# Patient Record
Sex: Female | Born: 1975 | Race: White | Hispanic: No | Marital: Married | Smoking: Never smoker
Health system: Southern US, Community
[De-identification: ages and names within clinical notes are randomized; demographics above are authoritative.]

## PROBLEM LIST (undated history)

## (undated) DIAGNOSIS — Z98891 History of uterine scar from previous surgery: Secondary | ICD-10-CM

## (undated) DIAGNOSIS — R002 Palpitations: Secondary | ICD-10-CM

## (undated) DIAGNOSIS — R519 Headache, unspecified: Secondary | ICD-10-CM

## (undated) DIAGNOSIS — R87629 Unspecified abnormal cytological findings in specimens from vagina: Secondary | ICD-10-CM

## (undated) DIAGNOSIS — Z789 Other specified health status: Secondary | ICD-10-CM

## (undated) DIAGNOSIS — K219 Gastro-esophageal reflux disease without esophagitis: Secondary | ICD-10-CM

## (undated) DIAGNOSIS — D6861 Antiphospholipid syndrome: Secondary | ICD-10-CM

## (undated) HISTORY — PX: TONSILLECTOMY: SHX5217

## (undated) HISTORY — PX: TONSILLECTOMY: SUR1361

## (undated) HISTORY — DX: Unspecified abnormal cytological findings in specimens from vagina: R87.629

## (undated) HISTORY — DX: Palpitations: R00.2

## (undated) HISTORY — PX: LAPAROSCOPIC GASTRIC BANDING: SHX1100

---

## 1998-06-05 HISTORY — PX: KNEE ARTHROSCOPY: SUR90

## 1998-08-04 ENCOUNTER — Other Ambulatory Visit: Admission: RE | Admit: 1998-08-04 | Discharge: 1998-08-04 | Payer: Self-pay | Admitting: *Deleted

## 1998-10-04 ENCOUNTER — Ambulatory Visit (HOSPITAL_COMMUNITY): Admission: RE | Admit: 1998-10-04 | Discharge: 1998-10-04 | Payer: Self-pay | Admitting: *Deleted

## 1999-10-12 ENCOUNTER — Ambulatory Visit (HOSPITAL_BASED_OUTPATIENT_CLINIC_OR_DEPARTMENT_OTHER): Admission: RE | Admit: 1999-10-12 | Discharge: 1999-10-12 | Payer: Self-pay | Admitting: Orthopedic Surgery

## 2004-02-01 ENCOUNTER — Other Ambulatory Visit: Admission: RE | Admit: 2004-02-01 | Discharge: 2004-02-01 | Payer: Self-pay | Admitting: Obstetrics and Gynecology

## 2005-01-02 ENCOUNTER — Encounter: Admission: RE | Admit: 2005-01-02 | Discharge: 2005-04-02 | Payer: Self-pay | Admitting: General Surgery

## 2005-05-15 ENCOUNTER — Other Ambulatory Visit: Admission: RE | Admit: 2005-05-15 | Discharge: 2005-05-15 | Payer: Self-pay | Admitting: Obstetrics and Gynecology

## 2005-06-01 ENCOUNTER — Ambulatory Visit (HOSPITAL_COMMUNITY): Admission: AD | Admit: 2005-06-01 | Discharge: 2005-06-01 | Payer: Self-pay | Admitting: Obstetrics and Gynecology

## 2005-06-01 ENCOUNTER — Encounter (INDEPENDENT_AMBULATORY_CARE_PROVIDER_SITE_OTHER): Payer: Self-pay | Admitting: *Deleted

## 2006-04-04 ENCOUNTER — Emergency Department (HOSPITAL_COMMUNITY): Admission: EM | Admit: 2006-04-04 | Discharge: 2006-04-04 | Payer: Self-pay | Admitting: *Deleted

## 2006-07-23 ENCOUNTER — Inpatient Hospital Stay (HOSPITAL_COMMUNITY): Admission: AD | Admit: 2006-07-23 | Discharge: 2006-07-23 | Payer: Self-pay | Admitting: Obstetrics and Gynecology

## 2007-02-16 ENCOUNTER — Emergency Department (HOSPITAL_COMMUNITY): Admission: EM | Admit: 2007-02-16 | Discharge: 2007-02-16 | Payer: Self-pay | Admitting: Emergency Medicine

## 2009-11-02 ENCOUNTER — Ambulatory Visit (HOSPITAL_COMMUNITY): Admission: RE | Admit: 2009-11-02 | Discharge: 2009-11-02 | Payer: Self-pay | Admitting: Surgery

## 2009-11-16 ENCOUNTER — Ambulatory Visit (HOSPITAL_COMMUNITY): Admission: RE | Admit: 2009-11-16 | Discharge: 2009-11-16 | Payer: Self-pay | Admitting: Surgery

## 2009-11-17 ENCOUNTER — Encounter: Admission: RE | Admit: 2009-11-17 | Discharge: 2009-11-17 | Payer: Self-pay | Admitting: Surgery

## 2010-01-13 ENCOUNTER — Encounter
Admission: RE | Admit: 2010-01-13 | Discharge: 2010-04-13 | Payer: Self-pay | Source: Home / Self Care | Admitting: Surgery

## 2010-02-01 ENCOUNTER — Ambulatory Visit (HOSPITAL_COMMUNITY): Admission: RE | Admit: 2010-02-01 | Discharge: 2010-02-02 | Payer: Self-pay | Admitting: Surgery

## 2010-02-15 ENCOUNTER — Encounter: Admission: RE | Admit: 2010-02-15 | Discharge: 2010-03-04 | Payer: Self-pay | Admitting: Surgery

## 2010-06-07 ENCOUNTER — Encounter: Admit: 2010-06-07 | Payer: Self-pay | Admitting: Surgery

## 2010-06-26 ENCOUNTER — Encounter: Payer: Self-pay | Admitting: General Surgery

## 2010-08-19 LAB — CBC
HCT: 35.5 % — ABNORMAL LOW (ref 36.0–46.0)
HCT: 37.8 % (ref 36.0–46.0)
Hemoglobin: 12.2 g/dL (ref 12.0–15.0)
Hemoglobin: 13 g/dL (ref 12.0–15.0)
MCH: 29.3 pg (ref 26.0–34.0)
MCH: 29.3 pg (ref 26.0–34.0)
MCHC: 34.4 g/dL (ref 30.0–36.0)
MCHC: 34.5 g/dL (ref 30.0–36.0)
MCV: 84.9 fL (ref 78.0–100.0)
MCV: 85.1 fL (ref 78.0–100.0)
Platelets: 220 10*3/uL (ref 150–400)
Platelets: 229 10*3/uL (ref 150–400)
RBC: 4.18 MIL/uL (ref 3.87–5.11)
RBC: 4.44 MIL/uL (ref 3.87–5.11)
RDW: 14.8 % (ref 11.5–15.5)
RDW: 14.8 % (ref 11.5–15.5)
WBC: 6.1 10*3/uL (ref 4.0–10.5)
WBC: 7.9 10*3/uL (ref 4.0–10.5)

## 2010-08-19 LAB — DIFFERENTIAL
Basophils Absolute: 0 10*3/uL (ref 0.0–0.1)
Basophils Absolute: 0 10*3/uL (ref 0.0–0.1)
Basophils Relative: 0 % (ref 0–1)
Basophils Relative: 0 % (ref 0–1)
Eosinophils Absolute: 0 10*3/uL (ref 0.0–0.7)
Eosinophils Absolute: 0 10*3/uL (ref 0.0–0.7)
Eosinophils Relative: 0 % (ref 0–5)
Eosinophils Relative: 1 % (ref 0–5)
Lymphocytes Relative: 28 % (ref 12–46)
Lymphocytes Relative: 7 % — ABNORMAL LOW (ref 12–46)
Lymphs Abs: 0.6 10*3/uL — ABNORMAL LOW (ref 0.7–4.0)
Lymphs Abs: 1.7 10*3/uL (ref 0.7–4.0)
Monocytes Absolute: 0.3 10*3/uL (ref 0.1–1.0)
Monocytes Absolute: 0.4 10*3/uL (ref 0.1–1.0)
Monocytes Relative: 4 % (ref 3–12)
Monocytes Relative: 7 % (ref 3–12)
Neutro Abs: 3.9 10*3/uL (ref 1.7–7.7)
Neutro Abs: 7 10*3/uL (ref 1.7–7.7)
Neutrophils Relative %: 64 % (ref 43–77)
Neutrophils Relative %: 89 % — ABNORMAL HIGH (ref 43–77)

## 2010-08-19 LAB — COMPREHENSIVE METABOLIC PANEL
ALT: 29 U/L (ref 0–35)
AST: 22 U/L (ref 0–37)
Albumin: 3.8 g/dL (ref 3.5–5.2)
Alkaline Phosphatase: 53 U/L (ref 39–117)
BUN: 11 mg/dL (ref 6–23)
CO2: 28 mEq/L (ref 19–32)
Calcium: 9 mg/dL (ref 8.4–10.5)
Chloride: 107 mEq/L (ref 96–112)
Creatinine, Ser: 0.71 mg/dL (ref 0.4–1.2)
GFR calc Af Amer: >60 mL/min (ref >60)
GFR calc non Af Amer: >60 mL/min (ref >60)
Glucose, Bld: 83 mg/dL (ref 70–99)
Potassium: 4 mEq/L (ref 3.5–5.1)
Sodium: 140 mEq/L (ref 135–145)
Total Bilirubin: 0.4 mg/dL (ref 0.3–1.2)
Total Protein: 6.8 g/dL (ref 6.0–8.3)

## 2010-08-19 LAB — SURGICAL PCR SCREEN
MRSA, PCR: NEGATIVE
Staphylococcus aureus: NEGATIVE

## 2010-08-19 LAB — PREGNANCY, URINE: Preg Test, Ur: NEGATIVE

## 2010-08-19 LAB — HEMOGLOBIN AND HEMATOCRIT, BLOOD
HCT: 37.9 % (ref 36.0–46.0)
Hemoglobin: 12.9 g/dL (ref 12.0–15.0)

## 2010-10-21 NOTE — Op Note (Signed)
NAME:  Tiffany Ayers, Tiffany Ayers NO.:  [id number]   MEDICAL RECORD NO.:  [id number]          PATIENT TYPE:  AMB   LOCATION:  [facility]                          FACILITY:  [facility]   PHYSICIAN:  Dineen Kid. Rana Snare, M.D.    DATE OF BIRTH:  [birth year]   DATE OF PROCEDURE:  06/01/2005  DATE OF DISCHARGE:                                 OPERATIVE REPORT   PREOPERATIVE DIAGNOSIS:  Incomplete abortion, approximately six weeks  gestational size.   POSTOPERATIVE DIAGNOSIS:  Incomplete abortion, approximately six weeks  gestational size.   OPERATION/PROCEDURE:  Dilation and evacuation.   SURGEON:  Dineen Kid. Rana Snare, M.D.   ANESTHESIA:  Monitored anesthesia care and paracervical block.   INDICATIONS:  Mrs. Pae is a 35 year old, gravida 2, para 1, who presented  at the [facility] with cramping and bleeding.  Bleeding was heavy.  Underwent ultrasound evaluation showing tissue in the lower segment of the  uterus. The patient continued to have cramping and heavy bleeding.  Cervix  was dilated to the fingertip.  She desired dilation and evacuation for an  incomplete abortion.  Risks and benefits were discussed. Informed consent  was obtained.  Her blood type was A positive.  Hemoglobin was 13.6 upon  presentation.  Beta hCG was 2700.   DESCRIPTION OF PROCEDURE:  After adequate analgesia, the patient was placed  in the dorsal lithotomy position. She was sterilely prepped and draped.  Bladder was sterilely drained.  Graves speculum was placed.  Tenaculum was  placed on the anterior lip of the cervix.  Paracervical block was placed  with 1% Xylocaine with 1:100,000 epinephrine.  A total of 20 mL used.  Uterus was sounded to 8 cm.  A #27 Pratt dilator was easily inserted after  the cervix was already dilated.  A 7 mm suction curet was inserted and  products of conception were retrieved.  This was performed until a gritty  surface was felt throughout the endometrial cavity and no residual  products  palpable.  The patient received Methergine 0.2 mg IM with good uterine  response.  The curet was then removed and tenaculum removed from the cervix.  The cervix was noted to be hemostatic.  The patient was then transverse to  the recovery room in satisfactory condition.  Sponge and instrument counts  were normal x3.  Estimated blood loss was minimal.  At the time of the  procedure, the patient received Toradol 30 mg postoperatively.   DISPOSITION:  The patient will be discharged home to follow up in the office  in two to three weeks.  Sent home with a routine instruction sheet for  dilation and evacuation.  Told to return for increased pain, fever or  bleeding.  She was also given a prescription for doxycycline, 100 mg p.o.  b.i.d. for seven days and Methergine 0.2 mg to take every eight hours for  two days.     Dineen Kid Rana Snare, M.D.  Electronically Signed    DCL/MEDQ  D:  06/01/2005  T:  06/02/2005  Job:  [id number]

## 2010-12-15 ENCOUNTER — Ambulatory Visit (INDEPENDENT_AMBULATORY_CARE_PROVIDER_SITE_OTHER): Payer: BC Managed Care – PPO | Admitting: Surgery

## 2010-12-15 VITALS — BP 117/79 | Ht 65.5 in | Wt 189.4 lb

## 2010-12-15 DIAGNOSIS — Z9884 Bariatric surgery status: Secondary | ICD-10-CM

## 2010-12-15 NOTE — Progress Notes (Signed)
Patient returns today and feels like she has a little tight. She is 10-1/2 months post lap band and hiatal hernia repair.  I removed 0.25 cc from her band today and we'll see her back in 4 weeks. Overall she looks very good and she has lost 28% of her excess weight and BMI is now 31. Plan return in 4 weeks

## 2011-01-26 ENCOUNTER — Encounter (INDEPENDENT_AMBULATORY_CARE_PROVIDER_SITE_OTHER): Payer: Self-pay | Admitting: Surgery

## 2011-01-27 ENCOUNTER — Ambulatory Visit (INDEPENDENT_AMBULATORY_CARE_PROVIDER_SITE_OTHER): Payer: BC Managed Care – PPO | Admitting: Physician Assistant

## 2011-01-27 ENCOUNTER — Encounter (INDEPENDENT_AMBULATORY_CARE_PROVIDER_SITE_OTHER): Payer: Self-pay

## 2011-01-27 VITALS — BP 120/64 | Ht 65.0 in | Wt 186.4 lb

## 2011-01-27 DIAGNOSIS — Z4651 Encounter for fitting and adjustment of gastric lap band: Secondary | ICD-10-CM

## 2011-01-27 NOTE — Patient Instructions (Signed)
Return after delivery or sooner if needed.

## 2011-01-27 NOTE — Progress Notes (Signed)
  HISTORY: Tiffany Ayers is a 35 y.o.female who received an AP-Standard lap-band in August 2011 by Dr. Daphine Deutscher. She comes in [redacted] weeks pregnant and is complaining of persistent hunger and the inability to tolerate large enough portions to satisfy her hunger. She's having a bit of morning sickness which is alleviated by food. She would like fluid removed from her band.  VITAL SIGNS: Filed Vitals:   01/27/11 0859  BP: 120/64    PHYSICAL EXAM: Physical exam reveals a very well-appearing 35 y.o.female in no apparent distress Neurologic: Awake, alert, oriented Psych: Bright affect, conversant Respiratory: Breathing even and unlabored. No stridor or wheezing Abdomen: Soft, nontender, nondistended to palpation. Incisions well-healed. No incisional hernias. Port easily palpated. Extremities: Atraumatic, good range of motion.  ASSESMENT: 35 y.o.  female  s/p AP-Standard lap-band.   PLAN: We removed 5.0 mL from the band, leaving it dry. She is asked to return after delivery or sooner if needed.

## 2011-02-09 LAB — GC/CHLAMYDIA PROBE AMP, GENITAL
Chlamydia: NEGATIVE
Gonorrhea: NEGATIVE

## 2011-02-09 LAB — CBC
HCT: 39 % (ref 36–46)
Hemoglobin: 12.7 g/dL (ref 12.0–16.0)

## 2011-02-09 LAB — ABO/RH: RH Type: POSITIVE

## 2011-02-09 LAB — ANTIBODY SCREEN: Antibody Screen: NEGATIVE

## 2011-02-09 LAB — RUBELLA ANTIBODY, IGM: Rubella: NON-IMMUNE/NOT IMMUNE

## 2011-02-09 LAB — HIV ANTIBODY (ROUTINE TESTING W REFLEX): HIV: NONREACTIVE

## 2011-02-09 LAB — HEPATITIS B SURFACE ANTIGEN: Hepatitis B Surface Ag: NEGATIVE

## 2011-02-09 LAB — RPR: RPR: NONREACTIVE

## 2011-06-09 LAB — RPR: RPR: NONREACTIVE

## 2011-06-09 LAB — CBC: Hemoglobin: 14.6 g/dL (ref 12.0–16.0)

## 2011-06-22 NOTE — OR Nursing (Signed)
Patient would like for her 35 year old son to be the first person to see the newborn.  Please do not allow other family members to see  Newborn before other family members

## 2011-08-07 ENCOUNTER — Encounter (HOSPITAL_COMMUNITY): Payer: Self-pay | Admitting: Pharmacist

## 2011-08-21 ENCOUNTER — Encounter (HOSPITAL_COMMUNITY): Payer: Self-pay | Admitting: Anesthesiology

## 2011-08-21 ENCOUNTER — Encounter (HOSPITAL_COMMUNITY): Payer: Self-pay | Admitting: *Deleted

## 2011-08-21 ENCOUNTER — Encounter (HOSPITAL_COMMUNITY): Payer: Self-pay | Admitting: Obstetrics and Gynecology

## 2011-08-21 ENCOUNTER — Inpatient Hospital Stay (HOSPITAL_COMMUNITY): Payer: BC Managed Care – PPO | Admitting: Anesthesiology

## 2011-08-21 ENCOUNTER — Inpatient Hospital Stay (HOSPITAL_COMMUNITY)
Admission: AD | Admit: 2011-08-21 | Discharge: 2011-08-23 | DRG: 371 | Disposition: A | Discharge status: Home / Self Care | Payer: BC Managed Care – PPO | Attending: Obstetrics and Gynecology | Admitting: Obstetrics and Gynecology

## 2011-08-21 ENCOUNTER — Encounter (HOSPITAL_COMMUNITY): Payer: Self-pay

## 2011-08-21 ENCOUNTER — Encounter (HOSPITAL_COMMUNITY)
Admission: AD | Disposition: A | Discharge status: Home / Self Care | Payer: Self-pay | Source: Home / Self Care | Attending: Obstetrics and Gynecology

## 2011-08-21 DIAGNOSIS — O2620 Pregnancy care for patient with recurrent pregnancy loss, unspecified trimester: Secondary | ICD-10-CM | POA: Diagnosis present

## 2011-08-21 DIAGNOSIS — Z98891 History of uterine scar from previous surgery: Secondary | ICD-10-CM

## 2011-08-21 DIAGNOSIS — Z3480 Encounter for supervision of other normal pregnancy, unspecified trimester: Secondary | ICD-10-CM

## 2011-08-21 DIAGNOSIS — O34219 Maternal care for unspecified type scar from previous cesarean delivery: Principal | ICD-10-CM

## 2011-08-21 HISTORY — DX: History of uterine scar from previous surgery: Z98.891

## 2011-08-21 HISTORY — DX: Antiphospholipid syndrome: D68.61

## 2011-08-21 LAB — RPR: RPR Ser Ql: NONREACTIVE

## 2011-08-21 LAB — CBC
HCT: 37.6 % (ref 36.0–46.0)
Hemoglobin: 12.7 g/dL (ref 12.0–15.0)
MCH: 29.3 pg (ref 26.0–34.0)
MCHC: 33.8 g/dL (ref 30.0–36.0)
MCV: 86.8 fL (ref 78.0–100.0)
Platelets: 171 10*3/uL (ref 150–400)
RBC: 4.33 MIL/uL (ref 3.87–5.11)
RDW: 15.2 % (ref 11.5–15.5)
WBC: 7.9 10*3/uL (ref 4.0–10.5)

## 2011-08-21 SURGERY — Surgical Case
Anesthesia: Spinal | Site: Uterus | Wound class: Clean Contaminated

## 2011-08-21 MED ORDER — NALOXONE HCL 0.4 MG/ML IJ SOLN: 0.4000 mg | INTRAMUSCULAR | Status: DC | PRN

## 2011-08-21 MED ORDER — IBUPROFEN 800 MG PO TABS
800.0000 mg | ORAL_TABLET | Freq: Three times a day (TID) | ORAL | Status: DC
  Administered 2011-08-21 – 2011-08-23 (×5): 800 mg via ORAL
  Filled 2011-08-21 (×6): qty 1

## 2011-08-21 MED ORDER — FAMOTIDINE IN NACL 20-0.9 MG/50ML-% IV SOLN
20.0000 mg | Freq: Once | INTRAVENOUS | Status: AC
  Administered 2011-08-21: 20 mg via INTRAVENOUS
  Filled 2011-08-21: qty 50

## 2011-08-21 MED ORDER — PROMETHAZINE HCL 25 MG/ML IJ SOLN: 6.2500 mg | INTRAMUSCULAR | Status: DC | PRN

## 2011-08-21 MED ORDER — DIPHENHYDRAMINE HCL 50 MG/ML IJ SOLN: 25.0000 mg | INTRAMUSCULAR | Status: DC | PRN

## 2011-08-21 MED ORDER — MORPHINE SULFATE (PF) 0.5 MG/ML IJ SOLN
INTRAMUSCULAR | Status: DC | PRN
  Administered 2011-08-21: .2 mg via INTRATHECAL

## 2011-08-21 MED ORDER — CEFAZOLIN SODIUM-DEXTROSE 2-3 GM-% IV SOLR
2.0000 g | INTRAVENOUS | Status: AC
  Administered 2011-08-21: 2 g via INTRAVENOUS
  Filled 2011-08-21: qty 50

## 2011-08-21 MED ORDER — PRENATAL MULTIVITAMIN CH: 1.0000 | ORAL_TABLET | Freq: Every day | ORAL | Status: DC

## 2011-08-21 MED ORDER — BUPIVACAINE IN DEXTROSE 0.75-8.25 % IT SOLN
INTRATHECAL | Status: DC | PRN
  Administered 2011-08-21: 1.4 mL via INTRATHECAL

## 2011-08-21 MED ORDER — ZOLPIDEM TARTRATE 5 MG PO TABS: 5.0000 mg | ORAL_TABLET | Freq: Every evening | ORAL | Status: DC | PRN

## 2011-08-21 MED ORDER — IBUPROFEN 600 MG PO TABS: 600.0000 mg | ORAL_TABLET | Freq: Four times a day (QID) | ORAL | Status: DC | PRN

## 2011-08-21 MED ORDER — OXYTOCIN 20 UNITS IN LACTATED RINGERS INFUSION - SIMPLE
125.0000 mL/h | INTRAVENOUS | Status: AC
  Administered 2011-08-21: 125 mL/h via INTRAVENOUS

## 2011-08-21 MED ORDER — CITRIC ACID-SODIUM CITRATE 334-500 MG/5ML PO SOLN
30.0000 mL | Freq: Once | ORAL | Status: AC
  Administered 2011-08-21: 30 mL via ORAL
  Filled 2011-08-21: qty 15

## 2011-08-21 MED ORDER — KETOROLAC TROMETHAMINE 30 MG/ML IJ SOLN: 30.0000 mg | Freq: Four times a day (QID) | INTRAMUSCULAR | Status: AC | PRN

## 2011-08-21 MED ORDER — SENNOSIDES-DOCUSATE SODIUM 8.6-50 MG PO TABS
2.0000 | ORAL_TABLET | Freq: Every day | ORAL | Status: DC
  Administered 2011-08-21 – 2011-08-22 (×2): 2 via ORAL

## 2011-08-21 MED ORDER — LACTATED RINGERS IV SOLN
INTRAVENOUS | Status: DC
  Administered 2011-08-21 (×4): via INTRAVENOUS

## 2011-08-21 MED ORDER — KETOROLAC TROMETHAMINE 30 MG/ML IJ SOLN: 15.0000 mg | Freq: Once | INTRAMUSCULAR | Status: DC | PRN

## 2011-08-21 MED ORDER — LACTATED RINGERS IV SOLN
INTRAVENOUS | Status: DC
  Administered 2011-08-21 (×2): via INTRAVENOUS

## 2011-08-21 MED ORDER — DIPHENHYDRAMINE HCL 50 MG/ML IJ SOLN: 12.5000 mg | INTRAMUSCULAR | Status: DC | PRN

## 2011-08-21 MED ORDER — PRENATAL MULTIVITAMIN CH
1.0000 | ORAL_TABLET | Freq: Every morning | ORAL | Status: DC
  Administered 2011-08-22 – 2011-08-23 (×2): 1 via ORAL
  Filled 2011-08-21 (×2): qty 1

## 2011-08-21 MED ORDER — MEASLES, MUMPS & RUBELLA VAC ~~LOC~~ INJ
0.5000 mL | INJECTION | Freq: Once | SUBCUTANEOUS | Status: AC
  Administered 2011-08-23: 0.5 mL via SUBCUTANEOUS
  Filled 2011-08-21: qty 0.5

## 2011-08-21 MED ORDER — SIMETHICONE 80 MG PO CHEW
80.0000 mg | CHEWABLE_TABLET | Freq: Three times a day (TID) | ORAL | Status: DC
  Administered 2011-08-21 – 2011-08-23 (×5): 80 mg via ORAL

## 2011-08-21 MED ORDER — KETOROLAC TROMETHAMINE 60 MG/2ML IM SOLN
60.0000 mg | Freq: Once | INTRAMUSCULAR | Status: AC | PRN
  Administered 2011-08-21: 60 mg via INTRAMUSCULAR

## 2011-08-21 MED ORDER — MENTHOL 3 MG MT LOZG: 1.0000 | LOZENGE | OROMUCOSAL | Status: DC | PRN

## 2011-08-21 MED ORDER — LANOLIN HYDROUS EX OINT: 1.0000 "application " | TOPICAL_OINTMENT | CUTANEOUS | Status: DC | PRN

## 2011-08-21 MED ORDER — DIBUCAINE 1 % RE OINT: 1.0000 "application " | TOPICAL_OINTMENT | RECTAL | Status: DC | PRN

## 2011-08-21 MED ORDER — ONDANSETRON HCL 4 MG/2ML IJ SOLN: 4.0000 mg | Freq: Three times a day (TID) | INTRAMUSCULAR | Status: DC | PRN

## 2011-08-21 MED ORDER — EPHEDRINE SULFATE 50 MG/ML IJ SOLN
INTRAMUSCULAR | Status: DC | PRN
  Administered 2011-08-21 (×4): 10 mg via INTRAVENOUS
  Administered 2011-08-21: 5 mg via INTRAVENOUS
  Administered 2011-08-21: 10 mg via INTRAVENOUS
  Administered 2011-08-21: 5 mg via INTRAVENOUS

## 2011-08-21 MED ORDER — WITCH HAZEL-GLYCERIN EX PADS: 1.0000 "application " | MEDICATED_PAD | CUTANEOUS | Status: DC | PRN

## 2011-08-21 MED ORDER — ONDANSETRON HCL 4 MG/2ML IJ SOLN: 4.0000 mg | INTRAMUSCULAR | Status: DC | PRN

## 2011-08-21 MED ORDER — OXYTOCIN 20 UNITS IN LACTATED RINGERS INFUSION - SIMPLE
INTRAVENOUS | Status: AC
  Filled 2011-08-21: qty 1000

## 2011-08-21 MED ORDER — MEPERIDINE HCL 25 MG/ML IJ SOLN: 6.2500 mg | INTRAMUSCULAR | Status: DC | PRN

## 2011-08-21 MED ORDER — OXYCODONE-ACETAMINOPHEN 5-325 MG PO TABS
1.0000 | ORAL_TABLET | ORAL | Status: DC | PRN
  Administered 2011-08-22 – 2011-08-23 (×5): 1 via ORAL
  Administered 2011-08-23: 2 via ORAL
  Filled 2011-08-21 (×3): qty 1
  Filled 2011-08-21: qty 2
  Filled 2011-08-21 (×2): qty 1

## 2011-08-21 MED ORDER — TETANUS-DIPHTH-ACELL PERTUSSIS 5-2.5-18.5 LF-MCG/0.5 IM SUSP
0.5000 mL | Freq: Once | INTRAMUSCULAR | Status: AC
  Administered 2011-08-23: 0.5 mL via INTRAMUSCULAR
  Filled 2011-08-21 (×2): qty 0.5

## 2011-08-21 MED ORDER — KETOROLAC TROMETHAMINE 60 MG/2ML IM SOLN
INTRAMUSCULAR | Status: AC
  Administered 2011-08-21: 60 mg via INTRAMUSCULAR
  Filled 2011-08-21: qty 2

## 2011-08-21 MED ORDER — FENTANYL CITRATE 0.05 MG/ML IJ SOLN
INTRAMUSCULAR | Status: DC | PRN
  Administered 2011-08-21: 12.5 ug via INTRATHECAL

## 2011-08-21 MED ORDER — FENTANYL CITRATE 0.05 MG/ML IJ SOLN: 25.0000 ug | INTRAMUSCULAR | Status: DC | PRN

## 2011-08-21 MED ORDER — DIPHENHYDRAMINE HCL 25 MG PO CAPS: 25.0000 mg | ORAL_CAPSULE | Freq: Four times a day (QID) | ORAL | Status: DC | PRN

## 2011-08-21 MED ORDER — ONDANSETRON HCL 4 MG PO TABS: 4.0000 mg | ORAL_TABLET | ORAL | Status: DC | PRN

## 2011-08-21 MED ORDER — SODIUM CHLORIDE 0.9 % IV SOLN: 1.0000 ug/kg/h | INTRAVENOUS | Status: DC | PRN

## 2011-08-21 MED ORDER — DIPHENHYDRAMINE HCL 25 MG PO CAPS: 25.0000 mg | ORAL_CAPSULE | ORAL | Status: DC | PRN

## 2011-08-21 MED ORDER — OXYTOCIN 10 UNIT/ML IJ SOLN
INTRAMUSCULAR | Status: DC | PRN
  Administered 2011-08-21: 20 [IU] via INTRAMUSCULAR

## 2011-08-21 MED ORDER — ONDANSETRON HCL 4 MG/2ML IJ SOLN
INTRAMUSCULAR | Status: DC | PRN
  Administered 2011-08-21: 4 mg via INTRAVENOUS

## 2011-08-21 MED ORDER — SIMETHICONE 80 MG PO CHEW: 80.0000 mg | CHEWABLE_TABLET | ORAL | Status: DC | PRN

## 2011-08-21 MED ORDER — SCOPOLAMINE 1 MG/3DAYS TD PT72: 1.0000 | MEDICATED_PATCH | Freq: Once | TRANSDERMAL | Status: DC

## 2011-08-21 MED ORDER — SODIUM CHLORIDE 0.9 % IJ SOLN: 3.0000 mL | INTRAMUSCULAR | Status: DC | PRN

## 2011-08-21 MED ORDER — PHENYLEPHRINE HCL 10 MG/ML IJ SOLN
INTRAMUSCULAR | Status: DC | PRN
  Administered 2011-08-21: 160 ug via INTRAVENOUS
  Administered 2011-08-21 (×2): 80 ug via INTRAVENOUS
  Administered 2011-08-21: 120 ug via INTRAVENOUS
  Administered 2011-08-21: 40 ug via INTRAVENOUS
  Administered 2011-08-21: 120 ug via INTRAVENOUS

## 2011-08-21 SURGICAL SUPPLY — 32 items
APL SKNCLS STERI-STRIP NONHPOA (GAUZE/BANDAGES/DRESSINGS) ×1
BENZOIN TINCTURE PRP APPL 2/3 (GAUZE/BANDAGES/DRESSINGS) ×1 IMPLANT
CHLORAPREP W/TINT 26ML (MISCELLANEOUS) ×2 IMPLANT
CLOTH BEACON ORANGE TIMEOUT ST (SAFETY) ×2 IMPLANT
CONTAINER PREFILL 10% NBF 15ML (MISCELLANEOUS) IMPLANT
DRSG COVADERM 4X10 (GAUZE/BANDAGES/DRESSINGS) ×1 IMPLANT
ELECT REM PT RETURN 9FT ADLT (ELECTROSURGICAL) ×2
ELECTRODE REM PT RTRN 9FT ADLT (ELECTROSURGICAL) ×1 IMPLANT
EXTRACTOR VACUUM M CUP 4 TUBE (SUCTIONS) ×1 IMPLANT
GLOVE BIO SURGEON STRL SZ 6.5 (GLOVE) ×3 IMPLANT
GLOVE BIO SURGEON STRL SZ7 (GLOVE) ×2 IMPLANT
GOWN PREVENTION PLUS LG XLONG (DISPOSABLE) ×6 IMPLANT
KIT ABG SYR 3ML LUER SLIP (SYRINGE) IMPLANT
NEEDLE HYPO 25X5/8 SAFETYGLIDE (NEEDLE) IMPLANT
NS IRRIG 1000ML POUR BTL (IV SOLUTION) ×2 IMPLANT
PACK C SECTION WH (CUSTOM PROCEDURE TRAY) ×2 IMPLANT
RTRCTR C-SECT PINK 25CM LRG (MISCELLANEOUS) ×2 IMPLANT
SLEEVE SCD COMPRESS KNEE MED (MISCELLANEOUS) IMPLANT
STAPLER VISISTAT 35W (STAPLE) IMPLANT
STRIP CLOSURE SKIN 1/2X4 (GAUZE/BANDAGES/DRESSINGS) ×2 IMPLANT
SUT MNCRL 0 VIOLET CTX 36 (SUTURE) ×2 IMPLANT
SUT MONOCRYL 0 CTX 36 (SUTURE) ×2
SUT PLAIN 1 NONE 54 (SUTURE) IMPLANT
SUT PLAIN 2 0 XLH (SUTURE) ×2 IMPLANT
SUT VIC AB 0 CT1 27 (SUTURE) ×4
SUT VIC AB 0 CT1 27XBRD ANBCTR (SUTURE) ×2 IMPLANT
SUT VIC AB 2-0 CT1 27 (SUTURE) ×2
SUT VIC AB 2-0 CT1 TAPERPNT 27 (SUTURE) ×1 IMPLANT
SYR BULB IRRIGATION 50ML (SYRINGE) ×2 IMPLANT
TOWEL OR 17X24 6PK STRL BLUE (TOWEL DISPOSABLE) ×4 IMPLANT
TRAY FOLEY CATH 14FR (SET/KITS/TRAYS/PACK) IMPLANT
WATER STERILE IRR 1000ML POUR (IV SOLUTION) ×2 IMPLANT

## 2011-08-21 NOTE — Brief Op Note (Signed)
08/21/2011  12:16 PM  PATIENT:  Tiffany Ayers  36 y.o. female  PRE-OPERATIVE DIAGNOSIS:  Repeat Cesarean Section; In Labor  POST-OPERATIVE DIAGNOSIS:  Repeat Cesarean Section; In Labor  PROCEDURE:  Procedure(s) (LRB): CESAREAN SECTION (N/A)  SURGEON:  Surgeon(s) and Role:    * Sherron Monday, MD - Primary    * Oliver Pila, MD - Assisting  Findings: viable female infant at 11:30am , apgars pending, wt pending; nl uterus, tubes and ovaries  ANESTHESIA:   spinal  EBL:  Total I/O In: 2700 [I.V.:2700] Out: 1050 [Urine:100; Blood:950]  BLOOD ADMINISTERED:none  DRAINS: none   LOCAL MEDICATIONS USED:  NONE  SPECIMEN:  Source of Specimen:  Placenta  DISPOSITION OF SPECIMEN:  L&D  COUNTS:  YES  TOURNIQUET:  * No tourniquets in log *  DICTATION: .Other Dictation: Dictation Number [id number]  PLAN OF CARE: Admit to inpatient   PATIENT DISPOSITION:  PACU - hemodynamically stable.   Delay start of Pharmacological VTE agent (>24hrs) due to surgical blood loss or risk of bleeding: not applicable

## 2011-08-21 NOTE — Anesthesia Preprocedure Evaluation (Signed)
Anesthesia Evaluation  Patient identified by MRN, date of birth, ID band Patient awake    Reviewed: Allergy & Precautions, H&P , NPO status , Patient's Chart, lab work & pertinent test results  Airway Mallampati: II TM Distance: >3 FB Neck ROM: full    Dental No notable dental hx.    Pulmonary neg pulmonary ROS,    Pulmonary exam normal       Cardiovascular negative cardio ROS      Neuro/Psych negative neurological ROS  negative psych ROS   GI/Hepatic negative GI ROS, Neg liver ROS,   Endo/Other    Renal/GU negative Renal ROS  negative genitourinary   Musculoskeletal negative musculoskeletal ROS (+)   Abdominal (+) + obese,   Peds negative pediatric ROS (+)  Hematology negative hematology ROS (+)   Anesthesia Other Findings   Reproductive/Obstetrics (+) Pregnancy                           Anesthesia Physical Anesthesia Plan  ASA: III and Emergent  Anesthesia Plan: Spinal   Post-op Pain Management:    Induction:   Airway Management Planned:   Additional Equipment:   Intra-op Plan:   Post-operative Plan:   Informed Consent: I have reviewed the patients History and Physical, chart, labs and discussed the procedure including the risks, benefits and alternatives for the proposed anesthesia with the patient or authorized representative who has indicated his/her understanding and acceptance.     Plan Discussed with: CRNA and Surgeon  Anesthesia Plan Comments:         Anesthesia Quick Evaluation

## 2011-08-21 NOTE — Plan of Care (Signed)
Called Dr Lilli Light regarding ? Additional labwork, pt has been on lovenox.  No additional orders received.

## 2011-08-21 NOTE — Anesthesia Procedure Notes (Signed)
Spinal  Patient location during procedure: OR Start time: 08/21/2011 11:11 AM End time: 08/21/2011 11:15 AM Staffing Anesthesiologist: Sandrea Hughs Performed by: anesthesiologist  Preanesthetic Checklist Completed: patient identified, site marked, surgical consent, pre-op evaluation, timeout performed, IV checked, risks and benefits discussed and monitors and equipment checked Spinal Block Patient position: sitting Prep: DuraPrep Patient monitoring: heart rate, cardiac monitor, continuous pulse ox and blood pressure Approach: midline Location: L3-4 Injection technique: single-shot Needle Needle type: Sprotte  Needle gauge: 24 G Needle length: 9 cm Needle insertion depth: 8 cm Assessment Sensory level: T4

## 2011-08-21 NOTE — Addendum Note (Signed)
Addendum  created 08/21/11 1557 by Renford Dills, CRNA   Modules edited:Notes Section

## 2011-08-21 NOTE — Transfer of Care (Signed)
Immediate Anesthesia Transfer of Care Note  Patient: Tiffany Ayers  Procedure(s) Performed: Procedure(s) (LRB): CESAREAN SECTION (N/A)  Patient Location: PACU  Anesthesia Type: Spinal  Level of Consciousness: awake, alert  and oriented  Airway & Oxygen Therapy: Patient Spontanous Breathing  Post-op Assessment: Report given to PACU RN and Post -op Vital signs reviewed and stable  Post vital signs: Reviewed and stable  Complications: No apparent anesthesia complications

## 2011-08-21 NOTE — Anesthesia Postprocedure Evaluation (Signed)
  Anesthesia Post-op Note  Patient: Tiffany Ayers  Procedure(s) Performed: Procedure(s) (LRB): CESAREAN SECTION (N/A)  Patient Location: Mother/Baby  Anesthesia Type: Spinal  Level of Consciousness: alert  and oriented  Airway and Oxygen Therapy: Patient Spontanous Breathing  Post-op Pain: mild  Post-op Assessment: Patient's Cardiovascular Status Stable and Respiratory Function Stable  Post-op Vital Signs: stable  Complications: No apparent anesthesia complications

## 2011-08-21 NOTE — Plan of Care (Signed)
Henley at bedside.  Dr Senaida Ores and Bovard will discuss plan and be down to discuss with pt.

## 2011-08-21 NOTE — MAU Note (Signed)
Pt states she has been having a borwn mucosy discharge since 0200 and also contractions since then

## 2011-08-21 NOTE — Anesthesia Postprocedure Evaluation (Signed)
Anesthesia Post Note  Patient: Tiffany Ayers  Procedure(s) Performed: Procedure(s) (LRB): CESAREAN SECTION (N/A)  Anesthesia type: Spinal  Patient location: PACU  Post pain: Pain level controlled  Post assessment: Post-op Vital signs reviewed  Last Vitals:  Filed Vitals:   08/21/11 0930  BP: 120/76  Pulse: 63  Temp: 36.8 C  Resp: 20    Post vital signs: Reviewed  Level of consciousness: awake  Complications: No apparent anesthesia complications

## 2011-08-21 NOTE — MAU Note (Signed)
CHG wipes and clipper prep completed.

## 2011-08-21 NOTE — H&P (Signed)
Tiffany Ayers is a 36 y.o. female presenting for early labor, with regular uncomfortable contractions.  Pt has been on Lovenox for h/o 1st trimester SAB x 4.  O/W relatively uncomplicated prenatal care.  . Maternal Medical History:  Contractions: Onset was 6-12 hours ago.   Frequency: regular.    Fetal activity: Perceived fetal activity is normal.      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 Anti-phospholipid antibody syndrome    Past Surgical History  Procedure Date  . Laparoscopic gastric banding   . Cesarean section   knee sx, laser, d&C  Family History: family history includes Diabetes in her mother and Hypertension in her mother.CAD, cleft lip, MI, thyroid disease  Social History:  reports that she has never smoked. She has never used smokeless tobacco. She reports that she drinks alcohol. She reports that she does not use illicit drugs.  All Vicodin Meds Lovenox, PNV  Review of Systems  Constitutional: Negative.   HENT: Negative.   Eyes: Negative.   Respiratory: Negative.   Cardiovascular: Negative.   Gastrointestinal: Negative.   Genitourinary: Negative.   Musculoskeletal: Negative.   Skin: Negative.   Neurological: Negative.   Psychiatric/Behavioral: Negative.     Dilation: 1 Effacement (%): Thick Station: Ballotable Exam by:: Dr Ambrose Mantle Blood pressure 101/51, pulse 65, temperature 98.9 F (37.2 C), temperature source Oral, resp. rate 20, height 5\' 5"  (1.651 m), weight 110.451 kg (243 lb 8 oz). Maternal Exam:  Uterine Assessment: Contraction strength is moderate.  Abdomen: Surgical scars: low transverse.   Fundal height is appropriate for gestation.   Estimated fetal weight is 7.       Physical Exam  Constitutional: She is  oriented to person, place, and time. She appears well-developed and well-nourished.  HENT:  Head: Normocephalic and atraumatic.  Eyes: Pupils are equal, round, and reactive to light.  Neck: Normal range of motion. Neck supple. No thyromegaly present.  Cardiovascular: Normal rate and regular rhythm.   Respiratory: Effort normal and breath sounds normal. No respiratory distress.  GI: Soft. Bowel sounds are normal. There is no tenderness.  Musculoskeletal: Normal range of motion.  Neurological: She is alert and oriented to person, place, and time.  Skin: Skin is warm and dry.  Psychiatric: She has a normal mood and affect. Her behavior is normal.    Prenatal labs: ABO, Rh:  A+ Antibody:  neg Rubella:  NI RPR:   NR HBsAg:   neg HIV:  neg  GBS:   neg Hgb 12/7/ Pap ASCUS, HR HPV neg, GC neg, Chl neg, CF neg, 1st tri and AFP declined/ glucola WNL  [place] at 9 wk cwd [place] 19wk, good growth, nl limited anat, female, post plac F/u scans to complete nl anat  Assessment/Plan: 35yo [id number] @ 38+ in latent labor D/w pt r/b/a of rLTCS, wishes to proceed Ancef for prophylaxis RNI - MMR before d/c Lovenox, last dose 8pm 3/17, cleared with anesthesia   BOVARD,Humberto Addo 08/21/2011, 8:40 AM

## 2011-08-22 LAB — CBC
HCT: 32 % — ABNORMAL LOW (ref 36.0–46.0)
Hemoglobin: 10.4 g/dL — ABNORMAL LOW (ref 12.0–15.0)
MCH: 28.8 pg (ref 26.0–34.0)
MCHC: 32.5 g/dL (ref 30.0–36.0)
MCV: 88.6 fL (ref 78.0–100.0)
Platelets: 135 10*3/uL — ABNORMAL LOW (ref 150–400)
RBC: 3.61 MIL/uL — ABNORMAL LOW (ref 3.87–5.11)
RDW: 15 % (ref 11.5–15.5)
WBC: 7.4 10*3/uL (ref 4.0–10.5)

## 2011-08-22 NOTE — Progress Notes (Signed)
Subjective: Postpartum Day 1: Cesarean Delivery Patient reports incisional pain and tolerating PO.  Pain controlled, nl lochia  Objective: Vital signs in last 24 hours: Temp:  [97.6 F (36.4 C)-98.5 F (36.9 C)] 98.1 F (36.7 C) (03/19 0627) Pulse Rate:  [63-72] 71  (03/19 0627) Resp:  [12-20] 18  (03/19 0627) BP: (100-153)/(58-80) 100/60 mmHg (03/19 0627) SpO2:  [97 %-100 %] 98 % (03/19 1610)  Physical Exam:  General: alert and no distress Lochia: appropriate Uterine Fundus: firm Incision: healing well DVT Evaluation: No evidence of DVT seen on physical exam.   Basename 08/22/11 0530 08/21/11 0900  HGB 10.4* 12.7  HCT 32.0* 37.6    Assessment/Plan: Status post Cesarean section. Doing well postoperatively.  Continue current care.  Circumcision tomorrow.    BOVARD,Garold Sheeler 08/22/2011, 8:39 AM

## 2011-08-22 NOTE — Op Note (Signed)
NAMEMarland Kitchen  Tiffany, Ayers NO.:  [id number]  MEDICAL RECORD NO.:  [id number]  LOCATION:  9104                          FACILITY:  [facility]  PHYSICIAN:  Sherron Monday, MD        DATE OF BIRTH:  1975-12-08  DATE OF PROCEDURE:  08/21/2011 DATE OF DISCHARGE:                              OPERATIVE REPORT   PREOPERATIVE DIAGNOSES:  Repeat cesarean section, in labor, intrauterine pregnancy at 38+ weeks.  POSTOPERATIVE DIAGNOSES:  Repeat cesarean section, in labor, intrauterine pregnancy at 38+ weeks, delivered.  PROCEDURE:  Repeat low-transverse cesarean section.  SURGEON:  Sherron Monday, MD  ASSISTANT:  Huel Cote, MD  FINDINGS:  Viable female infant at 11:30 a.m. with Apgars pending and weight pending at the time of dictation.  Normal uterus, tubes, and ovaries were noted.  ANESTHESIA:  Spinal.  IV FLUIDS:  2700.  URINE OUTPUT:  100 mL.  ESTIMATED BLOOD LOSS:  950 mL.  Placenta to L and D.  COMPLICATIONS:  None.  DISPOSITION:  Stable to PACU.  PROCEDURE:  Following review of risks benefits and alternatives of the surgical procedure, she was transported to the operating room, placed on the table in supine position.  After spinal anesthesia was induced, found to be adequate.  She was then prepped and draped in the normal sterile fashion.  Pfannenstiel skin incision was made at the level approximately 2 fingerbreadths above the pubic symphysis, carried through the underlying layer of fascia sharply.  The fascia was incised in midline and the incision was extended laterally with Mayo scissors. The inferior aspect of the fascial incision was grasped with Kocher clamps elevating the rectus muscles were dissected off both bluntly and sharply.  Attention was turned to the superior portion, which in a similar fashion, it was tented up with Kocher clamps.  The rectus muscles were dissected off both bluntly and sharply.  Midline was easily identified.  Peritoneum was  entered bluntly.  Incision was extended superiorly and inferiorly with good visualization of the bladder.  The Alexis skin retractor was placed carefully checking to make sure no bowel was entrapped.  The vesicouterine peritoneum was easily identified, tented up with pickups and a bladder flap was created both digitally and sharply.  Uterus was incised in a transverse fashion. Infant was delivered from vertex presentation with aid of vacuum.  The nose and mouth were suctioned.  Cord was clamped and cut.  Infant was handed off to the awaiting pediatric staff.  The placenta was expressed. Uterus was cleared of all clot and debris.  Uterine incision was closed with 2 layers of 0 Monocryl, 1st was in a running locked and 2nd as an imbricating layer.  Hemostasis was assured.  Pelvic irrigation was performed.  The peritoneum was reapproximated using 2-0 Vicryl.  The fascia was closed with 3-0 Vicryl from either corner in a running fashion.  The subcuticular adipose layer was irrigated and made hemostatic with Bovie cautery.  Dead space was closed using 2-0 plain gut.  The skin was closed with 3-0 Vicryl in subcuticular fashion as well as Benzoin and Steri-Strips applied. Sponge, lap, and needle counts were correct x2 at the  end of the procedure.  The patient tolerated the procedure well.     Sherron Monday, MD     JB/MEDQ  D:  08/21/2011  T:  08/22/2011  Job:  [id number]

## 2011-08-23 ENCOUNTER — Inpatient Hospital Stay (HOSPITAL_COMMUNITY): Admission: RE | Admit: 2011-08-23 | Payer: BC Managed Care – PPO | Source: Ambulatory Visit

## 2011-08-23 ENCOUNTER — Encounter (HOSPITAL_COMMUNITY): Payer: Self-pay | Admitting: *Deleted

## 2011-08-23 MED ORDER — IBUPROFEN 800 MG PO TABS: 800.0000 mg | ORAL_TABLET | Freq: Three times a day (TID) | ORAL | Status: AC

## 2011-08-23 MED ORDER — OXYCODONE-ACETAMINOPHEN 5-325 MG PO TABS
1.0000 | ORAL_TABLET | ORAL | Status: AC | PRN
Start: 2011-08-23 — End: 2011-09-02

## 2011-08-23 MED ORDER — PRENATAL MULTIVITAMIN CH: 1.0000 | ORAL_TABLET | Freq: Every morning | ORAL | Status: DC

## 2011-08-23 NOTE — Progress Notes (Addendum)
Subjective: Postpartum Day 2: Cesarean Delivery Patient reports incisional pain and tolerating PO.  Pain controlled, nl lochia  Objective: Vital signs in last 24 hours: Temp:  [98 F (36.7 C)-98.7 F (37.1 C)] 98.6 F (37 C) (03/20 0505) Pulse Rate:  [68-79] 79  (03/20 0505) Resp:  [18] 18  (03/20 0505) BP: (101-124)/(62-77) 119/72 mmHg (03/20 0505) good uop  Physical Exam:  General: alert and no distress Lochia: appropriate Uterine Fundus: firm Incision: healing well DVT Evaluation: No evidence of DVT seen on physical exam.   Basename 08/22/11 0530 08/21/11 0900  HGB 10.4* 12.7  HCT 32.0* 37.6    Assessment/Plan: Status post Cesarean section. Doing well postoperatively.  Continue current care. Pt desires d/c, Will d/c with Motrin/Percocet and PNV, f/u 2 weeks  BOVARD,Aliceson Dolbow 08/23/2011, 8:52 AM

## 2011-08-23 NOTE — Discharge Summary (Signed)
Obstetric Discharge Summary Reason for Admission: onset of labor Prenatal Procedures: NST Intrapartum Procedures: cesarean: low cervical, transverse Postpartum Procedures: Rubella Ig Complications-Operative and Postpartum: none Hemoglobin  Date Value Range Status  08/22/2011 10.4* 12.0-15.0 (g/dL) Final     REPEATED TO VERIFY     DELTA CHECK NOTED     HCT  Date Value Range Status  08/22/2011 32.0* 36.0-46.0 (%) Final    Physical Exam:  General: alert and no distress Lochia: appropriate Uterine Fundus: firm Incision: healing well DVT Evaluation: No evidence of DVT seen on physical exam.  Discharge Diagnoses: Term Pregnancy-delivered  Discharge Information: Date: 08/23/2011 Activity: pelvic rest Diet: routine Medications: PNV, Ibuprofen and Percocet Condition: stable Instructions: refer to practice specific booklet Discharge to: home Follow-up Information    Follow up with BOVARD,Rickia Freeburg, MD. Schedule an appointment as soon as possible for a visit in 2 weeks.   Contact information:   [address] [place] [address] [phone]          Newborn Data: Live born female  Birth Weight: 7 lb 11.6 oz (3505 g) APGAR: 8, 9 Eli, circumcision 3/20 Home with mother.  BOVARD,Maxx Calaway 08/23/2011, 9:22 AM

## 2011-08-28 ENCOUNTER — Encounter (HOSPITAL_COMMUNITY): Admission: RE | Payer: Self-pay | Source: Ambulatory Visit

## 2011-08-28 ENCOUNTER — Inpatient Hospital Stay (HOSPITAL_COMMUNITY)
Admission: RE | Admit: 2011-08-28 | Payer: BC Managed Care – PPO | Source: Ambulatory Visit | Admitting: Obstetrics and Gynecology

## 2011-08-28 SURGERY — Surgical Case
Anesthesia: Choice

## 2011-08-30 ENCOUNTER — Telehealth (INDEPENDENT_AMBULATORY_CARE_PROVIDER_SITE_OTHER): Payer: Self-pay | Admitting: Surgery

## 2011-08-31 ENCOUNTER — Ambulatory Visit (HOSPITAL_COMMUNITY)
Admission: RE | Admit: 2011-08-31 | Discharge: 2011-08-31 | Disposition: A | Discharge status: Home / Self Care | Payer: BC Managed Care – PPO | Source: Ambulatory Visit | Attending: Obstetrics and Gynecology | Admitting: Obstetrics and Gynecology

## 2011-08-31 NOTE — Progress Notes (Signed)
Adult Lactation Consultation Outpatient Visit Note  Patient Name: Tiffany Ayers  Baby - Vincente Liberty Date of Birth: [birth year]             DOB-[birth year]  Birth weight - 7 lbs. 11 oz.  Today's weight (47 days old)- 7 lbs. 14.9 oz  Beth comes in today for an outpatient appointment as she is concerned about her milk supply.  It was determined in the hospital that she needed to pump and bottle feed for a period of time to let baby grow in to her nipples.  Baby has a smaller mouth, and Beth has large nipples that gag baby.  She reports to have been pumping when baby eats, which is large part of formula.  Baby eats every 3-4 hrs as formula is digested quicker.  Beth is using a Medela Pump in Style at home and pumps anywhere between 22 ml to 60 ml.  In the last couple days, she has been pumping more regularly through the night.    We talked about ways to increase her milk supply: 1- pump more often, every 1 1/2-3 hrs during the day, one 4 hr stretch at night is ok.  (20 mins max) 2-" Power pump" once a day or every other day 3-hand express prior to pumping 4- warm compresses to breasts 5-Baby skin to skin more/manually express milk into his mouth 6-Moringa supplement  [name] is to make another appointment when baby is able to latch on more adequately, as she regularly tries latching once a day.  Great weight gain!     Judee Clara 08/31/2011, 3:26 PM

## 2011-09-08 NOTE — Telephone Encounter (Addendum)
LM for the patient to call the clinic scheduled for lap band fill on 11/09/11.

## 2011-09-28 ENCOUNTER — Ambulatory Visit (INDEPENDENT_AMBULATORY_CARE_PROVIDER_SITE_OTHER): Payer: Self-pay | Admitting: Physician Assistant

## 2011-09-28 ENCOUNTER — Encounter (INDEPENDENT_AMBULATORY_CARE_PROVIDER_SITE_OTHER): Payer: Self-pay | Admitting: Physician Assistant

## 2011-09-28 VITALS — BP 120/80 | Ht 65.0 in | Wt 223.6 lb

## 2011-09-28 DIAGNOSIS — Z4651 Encounter for fitting and adjustment of gastric lap band: Secondary | ICD-10-CM

## 2011-09-28 NOTE — Progress Notes (Signed)
  HISTORY: Tiffany Ayers is a 36 y.o.female who received an AP-Standard lap-band in August 2011 by Dr. Daphine Deutscher. She comes in after delivering her baby and wants to get back on track with weight loss. She had all fluid removed in August 2012.  VITAL SIGNS: Filed Vitals:   09/28/11 1248  BP: 120/80    PHYSICAL EXAM: Physical exam reveals a very well-appearing 36 y.o.female in no apparent distress Neurologic: Awake, alert, oriented Psych: Bright affect, conversant Respiratory: Breathing even and unlabored. No stridor or wheezing Abdomen: Soft, nontender, nondistended to palpation. Incisions well-healed. No incisional hernias. Port easily palpated. Extremities: Atraumatic, good range of motion.  ASSESMENT: 36 y.o.  female  s/p AP-Standard lap-band.   PLAN: The patient's port was accessed with a 20G Huber needle without difficulty. Clear fluid was aspirated and 4.5 mL saline was added to the port to give a total predicted volume of 4.5 mL. The patient was able to swallow water without difficulty following the procedure and was instructed to take clear liquids for the next 24-48 hours and advance slowly as tolerated.

## 2011-09-28 NOTE — Patient Instructions (Signed)
Take clear liquids tonight. Thin protein shakes are ok to start tomorrow morning. Slowly advance your diet thereafter. Call us if you have persistent vomiting or regurgitation, night cough or reflux symptoms. Return as scheduled or sooner if you notice no changes in hunger/portion sizes.  

## 2011-10-12 ENCOUNTER — Encounter (INDEPENDENT_AMBULATORY_CARE_PROVIDER_SITE_OTHER): Payer: Self-pay

## 2011-10-12 ENCOUNTER — Ambulatory Visit (INDEPENDENT_AMBULATORY_CARE_PROVIDER_SITE_OTHER): Payer: BC Managed Care – PPO | Admitting: Physician Assistant

## 2011-10-12 VITALS — BP 122/78 | Ht 65.0 in | Wt 217.6 lb

## 2011-10-12 DIAGNOSIS — Z4651 Encounter for fitting and adjustment of gastric lap band: Secondary | ICD-10-CM

## 2011-10-12 NOTE — Patient Instructions (Signed)
Take clear liquids tonight. Thin protein shakes are ok to start tomorrow morning. Slowly advance your diet thereafter. Call us if you have persistent vomiting or regurgitation, night cough or reflux symptoms. Return as scheduled or sooner if you notice no changes in hunger/portion sizes.  

## 2011-10-12 NOTE — Progress Notes (Signed)
  HISTORY: Tiffany Ayers is a 36 y.o.female who received an AP-Standard lap-band in August 2011 by Dr. Daphine Deutscher. She comes in for an adjustment having been seen two weeks ago. She is recently post-partum and is getting her band back to where it was before. She said she feels increased restriction but still has hunger and larger than expected portion sizes. No persistent vomiting or regurgitation.  VITAL SIGNS: Filed Vitals:   10/12/11 1031  BP: 122/78    PHYSICAL EXAM: Physical exam reveals a very well-appearing 36 y.o.female in no apparent distress Neurologic: Awake, alert, oriented Psych: Bright affect, conversant Respiratory: Breathing even and unlabored. No stridor or wheezing Abdomen: Soft, nontender, nondistended to palpation. Incisions well-healed. No incisional hernias. Port easily palpated. Extremities: Atraumatic, good range of motion.  ASSESMENT: 36 y.o.  female  s/p AP-Standard lap-band.   PLAN: The patient's port was accessed with a 20G Huber needle without difficulty. Clear fluid was aspirated and 0.5 mL saline was added to the port to give a total predicted volume of 5 mL. The patient was able to swallow water without difficulty following the procedure and was instructed to take clear liquids for the next 24-48 hours and advance slowly as tolerated.

## 2011-11-09 ENCOUNTER — Encounter (INDEPENDENT_AMBULATORY_CARE_PROVIDER_SITE_OTHER): Payer: Self-pay | Admitting: Surgery

## 2012-06-12 ENCOUNTER — Emergency Department (HOSPITAL_COMMUNITY)
Admission: EM | Admit: 2012-06-12 | Discharge: 2012-06-12 | Disposition: A | Discharge status: Home / Self Care | Payer: Medicaid Other | Attending: Emergency Medicine | Admitting: Emergency Medicine

## 2012-06-12 ENCOUNTER — Encounter (HOSPITAL_COMMUNITY): Payer: Self-pay

## 2012-06-12 DIAGNOSIS — K0889 Other specified disorders of teeth and supporting structures: Secondary | ICD-10-CM

## 2012-06-12 DIAGNOSIS — Z862 Personal history of diseases of the blood and blood-forming organs and certain disorders involving the immune mechanism: Secondary | ICD-10-CM | POA: Insufficient documentation

## 2012-06-12 DIAGNOSIS — K089 Disorder of teeth and supporting structures, unspecified: Secondary | ICD-10-CM | POA: Insufficient documentation

## 2012-06-12 MED ORDER — PENICILLIN V POTASSIUM 500 MG PO TABS: 500.0000 mg | ORAL_TABLET | Freq: Four times a day (QID) | ORAL | Status: DC

## 2012-06-12 NOTE — ED Provider Notes (Signed)
Medical screening examination/treatment/procedure(s) were performed by non-physician practitioner and as supervising physician I was immediately available for consultation/collaboration.    Charles B. Sheldon, MD 06/12/12 2000 

## 2012-06-12 NOTE — ED Provider Notes (Signed)
History     CSN: [id number]  Arrival date & time 06/12/12  1250   First MD Initiated Contact with Patient 06/12/12 1255      Chief Complaint  Patient presents with  . Dental Pain    (Consider location/radiation/quality/duration/timing/severity/associated sxs/prior treatment) HPI Comments: Patient presents to the emergency department with a dental complaint. Symptoms began around christmas. The patient has tried to alleviate pain with motrin.  Pain rated at a 10/10, characterized as throbbing in nature and located left lower molar. Patient denies fever, night sweats, chills, difficulty swallowing or opening mouth, SOB, nuchal rigidity or decreased ROM of neck.  Patient does not have a dentist and requests a resource guide at discharge.   Patient is a 37 y.o. female presenting with tooth pain.  Dental PainPrimary symptoms do not include headaches, fever, shortness of breath or sore throat.  Additional symptoms do not include: facial swelling, trouble swallowing, drooling and ear pain.    Past Medical History  Diagnosis Date  . Anti-phospholipid antibody syndrome   . 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 Normal pregnancy, repeat 08/21/2011  . 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 S/P cesarean section 08/21/2011    Past Surgical History  Procedure Date  . Laparoscopic gastric banding   . Cesarean section   . Cesarean section 08/21/2011    Procedure: CESAREAN SECTION;  Surgeon: Sherron Monday, MD;  Location: [facility];  Service: Gynecology;  Laterality: N/A;    Family History  Problem Relation Age of Onset  . Hypertension Mother   . Diabetes Mother     History  Substance Use Topics  . Smoking status: Never Smoker   . Smokeless tobacco: Never Used  . Alcohol Use: Yes     Comment: occasional    OB History    Grav Para Term Preterm Abortions TAB SAB Ect Mult Living   7 3 3  4  4   2       Review of Systems  Constitutional: Negative for fever, chills,  diaphoresis and activity change.  HENT: Positive for dental problem. Negative for ear pain, sore throat, facial swelling, drooling, mouth sores, trouble swallowing, neck pain, neck stiffness, voice change, sinus pressure and tinnitus.   Eyes: Negative for pain and visual disturbance.  Respiratory: Negative for shortness of breath, wheezing and stridor.   Cardiovascular: Negative for chest pain.  Gastrointestinal: Negative for nausea and abdominal pain.  Musculoskeletal: Negative for myalgias.  Skin: Negative for rash.  Neurological: Negative for speech difficulty and headaches.  Hematological: Negative for adenopathy.  All other systems reviewed and are negative.    Allergies  Vicodin  Home Medications  No current outpatient prescriptions on file.  BP 124/79  Pulse 85  Temp 98.2 F (36.8 C) (Oral)  Resp 20  Ht 5\' 5"  (1.651 m)  Wt 182 lb (82.555 kg)  BMI 30.29 kg/m2  SpO2 100%  LMP 05/04/2012  Physical Exam  Nursing note and vitals reviewed. Constitutional: She is oriented to person, place, and time. She appears well-developed and well-nourished. No distress.  HENT:  Head: Normocephalic and atraumatic. No trismus in the jaw.  Mouth/Throat: Uvula is midline, oropharynx is clear and moist and mucous membranes are normal. Abnormal dentition. No dental abscesses or uvula swelling. No oropharyngeal exudate, posterior oropharyngeal edema, posterior oropharyngeal erythema or tonsillar abscesses.       Pt able to open and close mouth with out difficulty. Airway intact. Uvula midline. No gingival swelling. Mild tenderness over affected area, but no  fluctuance. Left lower molar avulsion w possible pulp exposure. No swelling or tenderness of submental and submandibular regions.  Eyes: Conjunctivae normal and EOM are normal.  Neck: Normal range of motion and full passive range of motion without pain. Neck supple.  Cardiovascular: Normal rate and regular rhythm.   Pulmonary/Chest: Effort  normal and breath sounds normal. No stridor. No respiratory distress. She has no wheezes.  Musculoskeletal: Normal range of motion.  Lymphadenopathy:       Head (right side): No submental, no submandibular, no tonsillar, no preauricular and no posterior auricular adenopathy present.       Head (left side): No submental, no submandibular, no tonsillar, no preauricular and no posterior auricular adenopathy present.    She has no cervical adenopathy.  Neurological: She is alert and oriented to person, place, and time.  Skin: Skin is warm and dry. No rash noted. She is not diaphoretic.    ED Course  Procedures (including critical care time)  Labs Reviewed - No data to display No results found.   No diagnosis found.    MDM  Tooth avulsion  Patient with toothache.  No gross abscess.  Exam unconcerning for Ludwig's angina or spread of infection.  Will treat with penicillin & advised Tylenol for pain as pt thinks she might be pregnant.  Urged patient to follow-up with dentist.           Jaci Carrel, PA-C 06/12/12 1329

## 2012-06-12 NOTE — ED Notes (Signed)
Presents today with left lower wisdom tooth pain since Christmas. "And I believe I'm pregnant."

## 2012-07-10 ENCOUNTER — Encounter (INDEPENDENT_AMBULATORY_CARE_PROVIDER_SITE_OTHER): Payer: Self-pay | Admitting: Surgery

## 2012-07-10 ENCOUNTER — Telehealth (INDEPENDENT_AMBULATORY_CARE_PROVIDER_SITE_OTHER): Payer: Self-pay | Admitting: General Surgery

## 2012-07-10 ENCOUNTER — Ambulatory Visit (INDEPENDENT_AMBULATORY_CARE_PROVIDER_SITE_OTHER): Payer: BC Managed Care – PPO | Admitting: Surgery

## 2012-07-10 VITALS — BP 125/86 | HR 85 | Temp 98.7°F | Resp 14 | Ht 65.5 in | Wt 193.6 lb

## 2012-07-10 DIAGNOSIS — Z9884 Bariatric surgery status: Secondary | ICD-10-CM

## 2012-07-10 NOTE — Telephone Encounter (Signed)
Pt called to report she is now pregnant and wants the fluid removed from her lap band.  She is experiencing lots of nausea and vomiting.

## 2012-07-10 NOTE — Progress Notes (Signed)
[facility]  Ovidio Kin, MD,  FACS [address] [place]  [address] [place], [place]    [address] Phone:  [phone] FAX:  [phone]   Re:   Tiffany Ayers DOB:   02/19/1976 MRN:   [id number]  ASSESSMENT AND PLAN: 1.  Lap band - 02/01/2010 - M. Martin  Fluid removed for pregnancy.  She will contact the office after the pregnancy to start adjusting lap band again.  She has done well with the lap band and weight loss.  2.  Pregnancy  She is about [redacted] weeks along.  She is due in September 2014.  She had all the fluid removed her lap band during her last pregnancy, which ended March 2013.  This went well, so we are doing the same thing.  3.  Nausea - due to early pregnancy and lap band.   HISTORY OF PRESENT ILLNESS: Chief Complaint  Patient presents with  . Lap Band Fill    remove fluid    Tiffany Ayers is a 37 y.o. (DOB: 1975/11/30)  white female who is a patient of Provider Not In System and comes to me today for follow up of a lap band.  She is pregnant with her third child.  She has one son 16 yo, one son 24 months old, and now she is expecting again.   She is due in September 2014. She has had nausea.  She had all the fluid removed her lap band during her last pregnancy and that went well.  PHYSICAL EXAM: BP 125/86  Pulse 85  Temp 98.7 F (37.1 C) (Temporal)  Resp 14  Ht 5' 5.5" (1.664 m)  Wt 193 lb 9.6 oz (87.816 kg)  BMI 31.73 kg/m2  LMP 05/04/2012  Abdomen:  Wounds look good. Lap band in RUQ.  The port is tilted slightly caudad.  Procedure:  The lap band was accessed and 5 cc was removed from the lap band.  This was all of the fluid and this should leave no fluid in the band.  DATA REVIEWED: Old chart.  Ovidio Kin, MD, FACS Office:  [phone]

## 2012-07-11 ENCOUNTER — Encounter: Payer: Self-pay | Admitting: Obstetrics and Gynecology

## 2012-10-03 ENCOUNTER — Encounter: Payer: Self-pay | Admitting: Obstetrics & Gynecology

## 2012-10-03 ENCOUNTER — Ambulatory Visit (INDEPENDENT_AMBULATORY_CARE_PROVIDER_SITE_OTHER): Payer: Self-pay | Admitting: Obstetrics & Gynecology

## 2012-10-03 VITALS — BP 115/59 | Wt 216.0 lb

## 2012-10-03 DIAGNOSIS — O0932 Supervision of pregnancy with insufficient antenatal care, second trimester: Secondary | ICD-10-CM

## 2012-10-03 DIAGNOSIS — O09522 Supervision of elderly multigravida, second trimester: Secondary | ICD-10-CM

## 2012-10-03 DIAGNOSIS — Z124 Encounter for screening for malignant neoplasm of cervix: Secondary | ICD-10-CM

## 2012-10-03 DIAGNOSIS — D689 Coagulation defect, unspecified: Secondary | ICD-10-CM

## 2012-10-03 DIAGNOSIS — O2620 Pregnancy care for patient with recurrent pregnancy loss, unspecified trimester: Secondary | ICD-10-CM

## 2012-10-03 DIAGNOSIS — Z302 Encounter for sterilization: Secondary | ICD-10-CM | POA: Insufficient documentation

## 2012-10-03 DIAGNOSIS — O34219 Maternal care for unspecified type scar from previous cesarean delivery: Secondary | ICD-10-CM

## 2012-10-03 DIAGNOSIS — Z1151 Encounter for screening for human papillomavirus (HPV): Secondary | ICD-10-CM

## 2012-10-03 DIAGNOSIS — Z113 Encounter for screening for infections with a predominantly sexual mode of transmission: Secondary | ICD-10-CM

## 2012-10-03 DIAGNOSIS — O0930 Supervision of pregnancy with insufficient antenatal care, unspecified trimester: Secondary | ICD-10-CM

## 2012-10-03 DIAGNOSIS — O2622 Pregnancy care for patient with recurrent pregnancy loss, second trimester: Secondary | ICD-10-CM

## 2012-10-03 DIAGNOSIS — IMO0002 Reserved for concepts with insufficient information to code with codable children: Secondary | ICD-10-CM | POA: Insufficient documentation

## 2012-10-03 DIAGNOSIS — O9912 Other diseases of the blood and blood-forming organs and certain disorders involving the immune mechanism complicating childbirth: Secondary | ICD-10-CM

## 2012-10-03 DIAGNOSIS — O09529 Supervision of elderly multigravida, unspecified trimester: Secondary | ICD-10-CM

## 2012-10-03 DIAGNOSIS — O99112 Other diseases of the blood and blood-forming organs and certain disorders involving the immune mechanism complicating pregnancy, second trimester: Secondary | ICD-10-CM

## 2012-10-03 LAB — TSH: TSH: 1.817 u[IU]/mL (ref 0.350–4.500)

## 2012-10-03 NOTE — Progress Notes (Signed)
P - 69 - Pt has not had insurance during pregnancy and this is her initial prenatal visit

## 2012-10-03 NOTE — Progress Notes (Signed)
Subjective:    Tiffany Ayers is a [name] [redacted]w[redacted]d being seen today for her first obstetrical visit.  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  Patient was on Lovenox 40 mg Flournoy daily for entire last pregnancy; no postpartum coagulation.  She had some leftover Lovenox and took it during first trimester this pregnancy, did not have any medication for second trimester due to insurance problems. History of two prior cesarean sections; first for NRFHT, second was elective repeat.  Desires sterilization.  She is of advanced maternal age; also has history of laparoscopic gastric banding but was told she has no absorption defiencies and the band has been loosened completely..  Patient does not intend to breast feed. Pregnancy history fully reviewed and history/problem list updated. Patient denies any current concerning symptoms. Of note, patient reports having two ultrasound earlier this pregnancy in the ER at [facility], both were limited but showed she is having a girl (has two sons at home).  Patient Active Problem List   Diagnosis Date Noted  . Desires Sterilization 10/03/2012  . Advanced maternal age, antepartum 10/03/2012  . Late prenatal care 10/03/2012  . 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 Previous cesarean section complicating pregnancy, antepartum 08/21/2011    HISTORY: OB History   Grav Para Term Preterm Abortions TAB SAB Ect Mult Living   7 2 2  4  4   2      # Outc Date GA Lbr Len/2nd Wgt Sex Del Anes PTL Lv   1 TRM 5/04 [redacted]w[redacted]d 00:00 6lb12oz(3.062kg) M LTCS EPI  Yes   Comments: Emergent  due NRFHR   2 SAB 2/07 [redacted]w[redacted]d          Comments: Needed D&C   3 SAB 2008 [redacted]w[redacted]d          4 SAB 2010  [redacted]w[redacted]d          5 SAB 5/12 [redacted]w[redacted]d          6 TRM 3/13 [redacted]w[redacted]d 00:00 7lb11.6oz(3.505kg) M CS-Vac Spinal  Yes   Comments: ERLTCS   7 CUR              Past Medical History  Diagnosis Date  . Anti-phospholipid antibody syndrome   . 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 S/P cesarean section 08/21/2011   Past Surgical History  Procedure Laterality Date  . Laparoscopic gastric banding    . Cesarean section  2004  . Cesarean section  08/21/2011    Procedure: CESAREAN SECTION;  Surgeon: Sherron Monday, MD;  Location: [facility];  Service: Gynecology;  Laterality: N/A;  . Knee arthroscopy  06/05/1998  . Tonsillectomy     Family History  Problem Relation Age of Onset  . Diabetes Maternal Grandmother   . Cleft lip Sister   . Heart disease Maternal Grandmother   . Heart disease Maternal Grandfather      Exam   BP 115/59  Wt 216 lb (97.977 kg)  BMI 35.38 kg/m2  LMP 05/04/2012 FHR 146 Uterus:   21 week size  Pelvic Exam:    Perineum: No Hemorrhoids, Normal Perineum   Vulva: normal   Vagina:  normal mucosa, normal discharge   Cervix: no bleeding following Pap and no cervical motion tenderness   Adnexa: normal adnexa  and no mass, fullness, tenderness   Bony Pelvis: average  System: Breast:  normal appearance, no masses or tenderness   Skin: normal coloration and turgor, no rashes   Neurologic: normal   Extremities: normal strength, tone, and muscle mass   HEENT PERRLA and extra ocular movement intact   Mouth/Teeth mucous membranes moist, pharynx normal without lesions and dental hygiene good   Neck supple and no masses   Cardiovascular: regular rate and rhythm   Respiratory:  appears well, vitals normal, no respiratory distress, acyanotic, normal RR, chest clear, no wheezing, crepitations, rhonchi, normal symmetric air entry   Abdomen: soft, non-tender; bowel sounds normal; no masses,  no organomegaly.  Able to palpate laparoscopic gastric band in mid upper abdomen.   Urinary: urethral meatus  normal     Assessment:   37 y.o. [id number] at [redacted]w[redacted]d here for initial OB visit. Patient Active Problem List   Diagnosis Date Noted  . Desires Sterilization 10/03/2012  . Advanced maternal age, antepartum 10/03/2012  . Late prenatal care 10/03/2012  . 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 Previous cesarean section complicating pregnancy, antepartum 08/21/2011    Plan:   Initial prenatal labs, thrombophilia panel drawn; will follow up results and manage accordingly. Continue Prenatal vitamins. Problem list reviewed and updated. Genetic Screening discussed; [place] lab drawn Ultrasound discussed; fetal survey: ordered. Follow up in 4 weeks or earlier if needed.  Jaynie Collins, MD, FACOG Attending Obstetrician & Gynecologist Faculty Practice, [facility]

## 2012-10-03 NOTE — Patient Instructions (Signed)
Pregnancy - Second Trimester The second trimester of pregnancy (3 to 6 months) is a period of rapid growth for you and your baby. At the end of the sixth month, your baby is about 9 inches long and weighs 1 1/2 pounds. You will begin to feel the baby move between 18 and 20 weeks of the pregnancy. This is called quickening. Weight gain is faster. A clear fluid (colostrum) may leak out of your breasts. You may feel small contractions of the womb (uterus). This is known as false labor or Braxton-Hicks contractions. This is like a practice for labor when the baby is ready to be born. Usually, the problems with morning sickness have usually passed by the end of your first trimester. Some women develop small dark blotches (called cholasma, mask of pregnancy) on their face that usually goes away after the baby is born. Exposure to the sun makes the blotches worse. Acne may also develop in some pregnant women and pregnant women who have acne, may find that it goes away. PRENATAL EXAMS  Blood work may continue to be done during prenatal exams. These tests are done to check on your health and the probable health of your baby. Blood work is used to follow your blood levels (hemoglobin). Anemia (low hemoglobin) is common during pregnancy. Iron and vitamins are given to help prevent this. You will also be checked for diabetes between 24 and 28 weeks of the pregnancy. Some of the previous blood tests may be repeated.  The size of the uterus is measured during each visit. This is to make sure that the baby is continuing to grow properly according to the dates of the pregnancy.  Your blood pressure is checked every prenatal visit. This is to make sure you are not getting toxemia.  Your urine is checked to make sure you do not have an infection, diabetes or protein in the urine.  Your weight is checked often to make sure gains are happening at the suggested rate. This is to ensure that both you and your baby are growing  normally.  Sometimes, an ultrasound is performed to confirm the proper growth and development of the baby. This is a test which bounces harmless sound waves off the baby so your caregiver can more accurately determine due dates. Sometimes, a specialized test is done on the amniotic fluid surrounding the baby. This test is called an amniocentesis. The amniotic fluid is obtained by sticking a needle into the belly (abdomen). This is done to check the chromosomes in instances where there is a concern about possible genetic problems with the baby. It is also sometimes done near the end of pregnancy if an early delivery is required. In this case, it is done to help make sure the baby's lungs are mature enough for the baby to live outside of the womb. CHANGES OCCURING IN THE SECOND TRIMESTER OF PREGNANCY Your body goes through many changes during pregnancy. They vary from person to person. Talk to your caregiver about changes you notice that you are concerned about.  During the second trimester, you will likely have an increase in your appetite. It is normal to have cravings for certain foods. This varies from person to person and pregnancy to pregnancy.  Your lower abdomen will begin to bulge.  You may have to urinate more often because the uterus and baby are pressing on your bladder. It is also common to get more bladder infections during pregnancy (pain with urination). You can help this by   drinking lots of fluids and emptying your bladder before and after intercourse.  You may begin to get stretch marks on your hips, abdomen, and breasts. These are normal changes in the body during pregnancy. There are no exercises or medications to take that prevent this change.  You may begin to develop swollen and bulging veins (varicose veins) in your legs. Wearing support hose, elevating your feet for 15 minutes, 3 to 4 times a day and limiting salt in your diet helps lessen the problem.  Heartburn may develop  as the uterus grows and pushes up against the stomach. Antacids recommended by your caregiver helps with this problem. Also, eating smaller meals 4 to 5 times a day helps.  Constipation can be treated with a stool softener or adding bulk to your diet. Drinking lots of fluids, vegetables, fruits, and whole grains are helpful.  Exercising is also helpful. If you have been very active up until your pregnancy, most of these activities can be continued during your pregnancy. If you have been less active, it is helpful to start an exercise program such as walking.  Hemorrhoids (varicose veins in the rectum) may develop at the end of the second trimester. Warm sitz baths and hemorrhoid cream recommended by your caregiver helps hemorrhoid problems.  Backaches may develop during this time of your pregnancy. Avoid heavy lifting, wear low heal shoes and practice good posture to help with backache problems.  Some pregnant women develop tingling and numbness of their hand and fingers because of swelling and tightening of ligaments in the wrist (carpel tunnel syndrome). This goes away after the baby is born.  As your breasts enlarge, you may have to get a bigger bra. Get a comfortable, cotton, support bra. Do not get a nursing bra until the last month of the pregnancy if you will be nursing the baby.  You may get a dark line from your belly button to the pubic area called the linea nigra.  You may develop rosy cheeks because of increase blood flow to the face.  You may develop spider looking lines of the face, neck, arms and chest. These go away after the baby is born. HOME CARE INSTRUCTIONS   It is extremely important to avoid all smoking, herbs, alcohol, and unprescribed drugs during your pregnancy. These chemicals affect the formation and growth of the baby. Avoid these chemicals throughout the pregnancy to ensure the delivery of a healthy infant.  Most of your home care instructions are the same as  suggested for the first trimester of your pregnancy. Keep your caregiver's appointments. Follow your caregiver's instructions regarding medication use, exercise and diet.  During pregnancy, you are providing food for you and your baby. Continue to eat regular, well-balanced meals. Choose foods such as meat, fish, milk and other low fat dairy products, vegetables, fruits, and whole-grain breads and cereals. Your caregiver will tell you of the ideal weight gain.  A physical sexual relationship may be continued up until near the end of pregnancy if there are no other problems. Problems could include early (premature) leaking of amniotic fluid from the membranes, vaginal bleeding, abdominal pain, or other medical or pregnancy problems.  Exercise regularly if there are no restrictions. Check with your caregiver if you are unsure of the safety of some of your exercises. The greatest weight gain will occur in the last 2 trimesters of pregnancy. Exercise will help you:  Control your weight.  Get you in shape for labor and delivery.  Lose weight   after you have the baby.  Wear a good support or jogging bra for breast tenderness during pregnancy. This may help if worn during sleep. Pads or tissues may be used in the bra if you are leaking colostrum.  Do not use hot tubs, steam rooms or saunas throughout the pregnancy.  Wear your seat belt at all times when driving. This protects you and your baby if you are in an accident.  Avoid raw meat, uncooked cheese, cat litter boxes and soil used by cats. These carry germs that can cause birth defects in the baby.  The second trimester is also a good time to visit your dentist for your dental health if this has not been done yet. Getting your teeth cleaned is OK. Use a soft toothbrush. Brush gently during pregnancy.  It is easier to loose urine during pregnancy. Tightening up and strengthening the pelvic muscles will help with this problem. Practice stopping your  urination while you are going to the bathroom. These are the same muscles you need to strengthen. It is also the muscles you would use as if you were trying to stop from passing gas. You can practice tightening these muscles up 10 times a set and repeating this about 3 times per day. Once you know what muscles to tighten up, do not perform these exercises during urination. It is more likely to contribute to an infection by backing up the urine.  Ask for help if you have financial, counseling or nutritional needs during pregnancy. Your caregiver will be able to offer counseling for these needs as well as refer you for other special needs.  Your skin may become oily. If so, wash your face with mild soap, use non-greasy moisturizer and oil or cream based makeup. MEDICATIONS AND DRUG USE IN PREGNANCY  Take prenatal vitamins as directed. The vitamin should contain 1 milligram of folic acid. Keep all vitamins out of reach of children. Only a couple vitamins or tablets containing iron may be fatal to a baby or young child when ingested.  Avoid use of all medications, including herbs, over-the-counter medications, not prescribed or suggested by your caregiver. Only take over-the-counter or prescription medicines for pain, discomfort, or fever as directed by your caregiver. Do not use aspirin.  Let your caregiver also know about herbs you may be using.  Alcohol is related to a number of birth defects. This includes fetal alcohol syndrome. All alcohol, in any form, should be avoided completely. Smoking will cause low birth rate and premature babies.  Street or illegal drugs are very harmful to the baby. They are absolutely forbidden. A baby born to an addicted mother will be addicted at birth. The baby will go through the same withdrawal an adult does. SEEK MEDICAL CARE IF:  You have any concerns or worries during your pregnancy. It is better to call with your questions if you feel they cannot wait, rather  than worry about them. SEEK IMMEDIATE MEDICAL CARE IF:   An unexplained oral temperature above 102 F (38.9 C) develops, or as your caregiver suggests.  You have leaking of fluid from the vagina (birth canal). If leaking membranes are suspected, take your temperature and tell your caregiver of this when you call.  There is vaginal spotting, bleeding, or passing clots. Tell your caregiver of the amount and how many pads are used. Light spotting in pregnancy is common, especially following intercourse.  You develop a bad smelling vaginal discharge with a change in the color from clear   to white.  You continue to feel sick to your stomach (nauseated) and have no relief from remedies suggested. You vomit blood or coffee ground-like materials.  You lose more than 2 pounds of weight or gain more than 2 pounds of weight over 1 week, or as suggested by your caregiver.  You notice swelling of your face, hands, feet, or legs.  You get exposed to German measles and have never had them.  You are exposed to fifth disease or chickenpox.  You develop belly (abdominal) pain. Round ligament discomfort is a common non-cancerous (benign) cause of abdominal pain in pregnancy. Your caregiver still must evaluate you.  You develop a bad headache that does not go away.  You develop fever, diarrhea, pain with urination, or shortness of breath.  You develop visual problems, blurry, or double vision.  You fall or are in a car accident or any kind of trauma.  There is mental or physical violence at home. Document Released: 05/16/2001 Document Revised: 08/14/2011 Document Reviewed: 11/18/2008 [employer] Patient Information 2013 [employer].  

## 2012-10-04 LAB — LUPUS ANTICOAGULANT PANEL
DRVVT: 34.5 secs (ref <42.9)
Lupus Anticoagulant: NOT DETECTED
PTT Lupus Anticoagulant: 33.3 secs (ref 28.0–43.0)

## 2012-10-04 LAB — OBSTETRIC PANEL
Antibody Screen: NEGATIVE
Basophils Absolute: 0 10*3/uL (ref 0.0–0.1)
Basophils Relative: 0 % (ref 0–1)
Eosinophils Absolute: 0 10*3/uL (ref 0.0–0.7)
Eosinophils Relative: 1 % (ref 0–5)
HCT: 33.7 % — ABNORMAL LOW (ref 36.0–46.0)
Hemoglobin: 11.3 g/dL — ABNORMAL LOW (ref 12.0–15.0)
Hepatitis B Surface Ag: NEGATIVE
Lymphocytes Relative: 25 % (ref 12–46)
Lymphs Abs: 1.2 10*3/uL (ref 0.7–4.0)
MCH: 30.1 pg (ref 26.0–34.0)
MCHC: 33.5 g/dL (ref 30.0–36.0)
MCV: 89.6 fL (ref 78.0–100.0)
Monocytes Absolute: 0.3 10*3/uL (ref 0.1–1.0)
Monocytes Relative: 6 % (ref 3–12)
Neutro Abs: 3.4 10*3/uL (ref 1.7–7.7)
Neutrophils Relative %: 68 % (ref 43–77)
Platelets: 178 10*3/uL (ref 150–400)
RBC: 3.76 MIL/uL — ABNORMAL LOW (ref 3.87–5.11)
RDW: 14.9 % (ref 11.5–15.5)
Rh Type: POSITIVE
Rubella: 1.4 Index — ABNORMAL HIGH (ref <0.90)
WBC: 4.9 10*3/uL (ref 4.0–10.5)

## 2012-10-04 LAB — COMPREHENSIVE METABOLIC PANEL
ALT: <8 U/L (ref 0–35)
AST: 8 U/L (ref 0–37)
Albumin: 3.4 g/dL — ABNORMAL LOW (ref 3.5–5.2)
Alkaline Phosphatase: 32 U/L — ABNORMAL LOW (ref 39–117)
BUN: 7 mg/dL (ref 6–23)
CO2: 20 mEq/L (ref 19–32)
Calcium: 8.4 mg/dL (ref 8.4–10.5)
Chloride: 107 mEq/L (ref 96–112)
Creat: 0.44 mg/dL — ABNORMAL LOW (ref 0.50–1.10)
Glucose, Bld: 82 mg/dL (ref 70–99)
Potassium: 3.8 mEq/L (ref 3.5–5.3)
Sodium: 135 mEq/L (ref 135–145)
Total Bilirubin: 0.3 mg/dL (ref 0.3–1.2)
Total Protein: 5.7 g/dL — ABNORMAL LOW (ref 6.0–8.3)

## 2012-10-04 LAB — FACTOR 5 LEIDEN

## 2012-10-04 LAB — ANTITHROMBIN III: AntiThromb III Func: 96 % (ref 76–126)

## 2012-10-04 LAB — PROTEIN C ACTIVITY: Protein C Activity: 184 % — ABNORMAL HIGH (ref 75–133)

## 2012-10-04 LAB — PROTEIN S ACTIVITY: Protein S Activity: 51 % — ABNORMAL LOW (ref 69–129)

## 2012-10-04 LAB — CARDIOLIPIN ANTIBODIES, IGG, IGM, IGA
Anticardiolipin IgA: 2 APL U/mL (ref <22)
Anticardiolipin IgG: 3 GPL U/mL (ref <23)
Anticardiolipin IgM: 5 MPL U/mL (ref <11)

## 2012-10-04 LAB — PROTEIN C, TOTAL: Protein C, Total: 92 % (ref 72–160)

## 2012-10-04 LAB — PROTEIN S, TOTAL: Protein S Total: 49 % — ABNORMAL LOW (ref 60–150)

## 2012-10-04 LAB — HIV ANTIBODY (ROUTINE TESTING W REFLEX): HIV: NONREACTIVE

## 2012-10-04 LAB — PROTHROMBIN GENE MUTATION

## 2012-10-05 ENCOUNTER — Encounter: Payer: Self-pay | Admitting: Obstetrics & Gynecology

## 2012-10-05 LAB — CULTURE, OB URINE: Colony Count: 8000

## 2012-10-08 ENCOUNTER — Encounter: Payer: Self-pay | Admitting: Obstetrics & Gynecology

## 2012-10-08 ENCOUNTER — Ambulatory Visit (HOSPITAL_COMMUNITY)
Admission: RE | Admit: 2012-10-08 | Discharge: 2012-10-08 | Disposition: A | Discharge status: Home / Self Care | Payer: Medicaid Other | Source: Ambulatory Visit | Attending: Obstetrics & Gynecology | Admitting: Obstetrics & Gynecology

## 2012-10-08 DIAGNOSIS — O09529 Supervision of elderly multigravida, unspecified trimester: Secondary | ICD-10-CM | POA: Insufficient documentation

## 2012-10-08 DIAGNOSIS — O358XX0 Maternal care for other (suspected) fetal abnormality and damage, not applicable or unspecified: Secondary | ICD-10-CM | POA: Insufficient documentation

## 2012-10-08 DIAGNOSIS — O34219 Maternal care for unspecified type scar from previous cesarean delivery: Secondary | ICD-10-CM

## 2012-10-08 DIAGNOSIS — O2620 Pregnancy care for patient with recurrent pregnancy loss, unspecified trimester: Secondary | ICD-10-CM | POA: Insufficient documentation

## 2012-10-08 DIAGNOSIS — Z1389 Encounter for screening for other disorder: Secondary | ICD-10-CM | POA: Insufficient documentation

## 2012-10-08 DIAGNOSIS — O09522 Supervision of elderly multigravida, second trimester: Secondary | ICD-10-CM

## 2012-10-08 DIAGNOSIS — Z302 Encounter for sterilization: Secondary | ICD-10-CM

## 2012-10-08 DIAGNOSIS — O0932 Supervision of pregnancy with insufficient antenatal care, second trimester: Secondary | ICD-10-CM

## 2012-10-08 DIAGNOSIS — Z363 Encounter for antenatal screening for malformations: Secondary | ICD-10-CM | POA: Insufficient documentation

## 2012-10-14 ENCOUNTER — Encounter: Payer: Self-pay | Admitting: Obstetrics & Gynecology

## 2012-10-14 LAB — HARMONY WITH XY ANALYSIS
FETAL CFDNA PERCENTAGE: 9.2
NUMBER OF FETUSES: 1
PDF Image: 0

## 2012-10-30 ENCOUNTER — Ambulatory Visit (INDEPENDENT_AMBULATORY_CARE_PROVIDER_SITE_OTHER): Payer: Medicaid Other | Admitting: Obstetrics & Gynecology

## 2012-10-30 ENCOUNTER — Encounter: Payer: Self-pay | Admitting: Obstetrics & Gynecology

## 2012-10-30 VITALS — BP 105/62 | Wt 222.0 lb

## 2012-10-30 DIAGNOSIS — O2622 Pregnancy care for patient with recurrent pregnancy loss, second trimester: Secondary | ICD-10-CM

## 2012-10-30 DIAGNOSIS — O2620 Pregnancy care for patient with recurrent pregnancy loss, unspecified trimester: Secondary | ICD-10-CM

## 2012-10-30 DIAGNOSIS — O0932 Supervision of pregnancy with insufficient antenatal care, second trimester: Secondary | ICD-10-CM

## 2012-10-30 DIAGNOSIS — O0930 Supervision of pregnancy with insufficient antenatal care, unspecified trimester: Secondary | ICD-10-CM

## 2012-10-30 NOTE — Progress Notes (Signed)
Protein C elevated and Protein S 51 low.   Not on Lovenox. RTC 2 weeks 1 hr GTT

## 2012-10-30 NOTE — Patient Instructions (Signed)
Pregnancy - Second Trimester The second trimester of pregnancy (3 to 6 months) is a period of rapid growth for you and your baby. At the end of the sixth month, your baby is about 9 inches long and weighs 1 1/2 pounds. You will begin to feel the baby move between 18 and 20 weeks of the pregnancy. This is called quickening. Weight gain is faster. A clear fluid (colostrum) may leak out of your breasts. You may feel small contractions of the womb (uterus). This is known as false labor or Braxton-Hicks contractions. This is like a practice for labor when the baby is ready to be born. Usually, the problems with morning sickness have usually passed by the end of your first trimester. Some women develop small dark blotches (called cholasma, mask of pregnancy) on their face that usually goes away after the baby is born. Exposure to the sun makes the blotches worse. Acne may also develop in some pregnant women and pregnant women who have acne, may find that it goes away. PRENATAL EXAMS  Blood work may continue to be done during prenatal exams. These tests are done to check on your health and the probable health of your baby. Blood work is used to follow your blood levels (hemoglobin). Anemia (low hemoglobin) is common during pregnancy. Iron and vitamins are given to help prevent this. You will also be checked for diabetes between 24 and 28 weeks of the pregnancy. Some of the previous blood tests may be repeated.  The size of the uterus is measured during each visit. This is to make sure that the baby is continuing to grow properly according to the dates of the pregnancy.  Your blood pressure is checked every prenatal visit. This is to make sure you are not getting toxemia.  Your urine is checked to make sure you do not have an infection, diabetes or protein in the urine.  Your weight is checked often to make sure gains are happening at the suggested rate. This is to ensure that both you and your baby are  growing normally.  Sometimes, an ultrasound is performed to confirm the proper growth and development of the baby. This is a test which bounces harmless sound waves off the baby so your caregiver can more accurately determine due dates. Sometimes, a test is done on the amniotic fluid surrounding the baby. This test is called an amniocentesis. The amniotic fluid is obtained by sticking a needle into the belly (abdomen). This is done to check the chromosomes in instances where there is a concern about possible genetic problems with the baby. It is also sometimes done near the end of pregnancy if an early delivery is required. In this case, it is done to help make sure the baby's lungs are mature enough for the baby to live outside of the womb. CHANGES OCCURING IN THE SECOND TRIMESTER OF PREGNANCY Your body goes through many changes during pregnancy. They vary from person to person. Talk to your caregiver about changes you notice that you are concerned about.  During the second trimester, you will likely have an increase in your appetite. It is normal to have cravings for certain foods. This varies from person to person and pregnancy to pregnancy.  Your lower abdomen will begin to bulge.  You may have to urinate more often because the uterus and baby are pressing on your bladder. It is also common to get more bladder infections during pregnancy. You can help this by drinking lots of fluids   and emptying your bladder before and after intercourse.  You may begin to get stretch marks on your hips, abdomen, and breasts. These are normal changes in the body during pregnancy. There are no exercises or medicines to take that prevent this change.  You may begin to develop swollen and bulging veins (varicose veins) in your legs. Wearing support hose, elevating your feet for 15 minutes, 3 to 4 times a day and limiting salt in your diet helps lessen the problem.  Heartburn may develop as the uterus grows and  pushes up against the stomach. Antacids recommended by your caregiver helps with this problem. Also, eating smaller meals 4 to 5 times a day helps.  Constipation can be treated with a stool softener or adding bulk to your diet. Drinking lots of fluids, and eating vegetables, fruits, and whole grains are helpful.  Exercising is also helpful. If you have been very active up until your pregnancy, most of these activities can be continued during your pregnancy. If you have been less active, it is helpful to start an exercise program such as walking.  Hemorrhoids may develop at the end of the second trimester. Warm sitz baths and hemorrhoid cream recommended by your caregiver helps hemorrhoid problems.  Backaches may develop during this time of your pregnancy. Avoid heavy lifting, wear low heal shoes, and practice good posture to help with backache problems.  Some pregnant women develop tingling and numbness of their hand and fingers because of swelling and tightening of ligaments in the wrist (carpel tunnel syndrome). This goes away after the baby is born.  As your breasts enlarge, you may have to get a bigger bra. Get a comfortable, cotton, support bra. Do not get a nursing bra until the last month of the pregnancy if you will be nursing the baby.  You may get a dark line from your belly button to the pubic area called the linea nigra.  You may develop rosy cheeks because of increase blood flow to the face.  You may develop spider looking lines of the face, neck, arms, and chest. These go away after the baby is born. HOME CARE INSTRUCTIONS   It is extremely important to avoid all smoking, herbs, alcohol, and unprescribed drugs during your pregnancy. These chemicals affect the formation and growth of the baby. Avoid these chemicals throughout the pregnancy to ensure the delivery of a healthy infant.  Most of your home care instructions are the same as suggested for the first trimester of your  pregnancy. Keep your caregiver's appointments. Follow your caregiver's instructions regarding medicine use, exercise, and diet.  During pregnancy, you are providing food for you and your baby. Continue to eat regular, well-balanced meals. Choose foods such as meat, fish, milk and other low fat dairy products, vegetables, fruits, and whole-grain breads and cereals. Your caregiver will tell you of the ideal weight gain.  A physical sexual relationship may be continued up until near the end of pregnancy if there are no other problems. Problems could include early (premature) leaking of amniotic fluid from the membranes, vaginal bleeding, abdominal pain, or other medical or pregnancy problems.  Exercise regularly if there are no restrictions. Check with your caregiver if you are unsure of the safety of some of your exercises. The greatest weight gain will occur in the last 2 trimesters of pregnancy. Exercise will help you:  Control your weight.  Get you in shape for labor and delivery.  Lose weight after you have the baby.  Wear   a good support or jogging bra for breast tenderness during pregnancy. This may help if worn during sleep. Pads or tissues may be used in the bra if you are leaking colostrum.  Do not use hot tubs, steam rooms or saunas throughout the pregnancy.  Wear your seat belt at all times when driving. This protects you and your baby if you are in an accident.  Avoid raw meat, uncooked cheese, cat litter boxes, and soil used by cats. These carry germs that can cause birth defects in the baby.  The second trimester is also a good time to visit your dentist for your dental health if this has not been done yet. Getting your teeth cleaned is okay. Use a soft toothbrush. Brush gently during pregnancy.  It is easier to leak urine during pregnancy. Tightening up and strengthening the pelvic muscles will help with this problem. Practice stopping your urination while you are going to the  bathroom. These are the same muscles you need to strengthen. It is also the muscles you would use as if you were trying to stop from passing gas. You can practice tightening these muscles up 10 times a set and repeating this about 3 times per day. Once you know what muscles to tighten up, do not perform these exercises during urination. It is more likely to contribute to an infection by backing up the urine.  Ask for help if you have financial, counseling, or nutritional needs during pregnancy. Your caregiver will be able to offer counseling for these needs as well as refer you for other special needs.  Your skin may become oily. If so, wash your face with mild soap, use non-greasy moisturizer and oil or cream based makeup. MEDICINES AND DRUG USE IN PREGNANCY  Take prenatal vitamins as directed. The vitamin should contain 1 milligram of folic acid. Keep all vitamins out of reach of children. Only a couple vitamins or tablets containing iron may be fatal to a baby or young child when ingested.  Avoid use of all medicines, including herbs, over-the-counter medicines, not prescribed or suggested by your caregiver. Only take over-the-counter or prescription medicines for pain, discomfort, or fever as directed by your caregiver. Do not use aspirin.  Let your caregiver also know about herbs you may be using.  Alcohol is related to a number of birth defects. This includes fetal alcohol syndrome. All alcohol, in any form, should be avoided completely. Smoking will cause low birth rate and premature babies.  Street or illegal drugs are very harmful to the baby. They are absolutely forbidden. A baby born to an addicted mother will be addicted at birth. The baby will go through the same withdrawal an adult does. SEEK MEDICAL CARE IF:  You have any concerns or worries during your pregnancy. It is better to call with your questions if you feel they cannot wait, rather than worry about them. SEEK IMMEDIATE  MEDICAL CARE IF:   An unexplained oral temperature above 102 F (38.9 C) develops, or as your caregiver suggests.  You have leaking of fluid from the vagina (birth canal). If leaking membranes are suspected, take your temperature and tell your caregiver of this when you call.  There is vaginal spotting, bleeding, or passing clots. Tell your caregiver of the amount and how many pads are used. Light spotting in pregnancy is common, especially following intercourse.  You develop a bad smelling vaginal discharge with a change in the color from clear to white.  You continue to feel   sick to your stomach (nauseated) and have no relief from remedies suggested. You vomit blood or coffee ground-like materials.  You lose more than 2 pounds of weight or gain more than 2 pounds of weight over 1 week, or as suggested by your caregiver.  You notice swelling of your face, hands, feet, or legs.  You get exposed to German measles and have never had them.  You are exposed to fifth disease or chickenpox.  You develop belly (abdominal) pain. Round ligament discomfort is a common non-cancerous (benign) cause of abdominal pain in pregnancy. Your caregiver still must evaluate you.  You develop a bad headache that does not go away.  You develop fever, diarrhea, pain with urination, or shortness of breath.  You develop visual problems, blurry, or double vision.  You fall or are in a car accident or any kind of trauma.  There is mental or physical violence at home. Document Released: 05/16/2001 Document Revised: 02/14/2012 Document Reviewed: 11/18/2008 [employer] Patient Information 2014 [employer].  

## 2012-11-01 ENCOUNTER — Encounter: Payer: Self-pay | Admitting: *Deleted

## 2012-11-06 ENCOUNTER — Telehealth: Payer: Self-pay

## 2012-11-06 NOTE — Telephone Encounter (Signed)
Hi Dr. Macon Large, This patient has questions regarding her labs. Tiffany Ayers is out and Dr.Pratt asked me to just ask you about it first. She looked at them and said they look fine but wanted you to look. Can you let me know what I need to tell patient, let me know and I will call her with results. Thanks!

## 2012-11-07 NOTE — Telephone Encounter (Signed)
Labs are within normal limits. Please call to inform patient of results. No intervention needed.

## 2012-11-10 LAB — OB RESULTS CONSOLE GC/CHLAMYDIA
Chlamydia: NEGATIVE
Gonorrhea: NEGATIVE

## 2012-11-10 LAB — OB RESULTS CONSOLE GBS: GBS: NEGATIVE

## 2012-11-14 ENCOUNTER — Ambulatory Visit (INDEPENDENT_AMBULATORY_CARE_PROVIDER_SITE_OTHER): Payer: Medicaid Other | Admitting: Obstetrics & Gynecology

## 2012-11-14 ENCOUNTER — Encounter: Payer: Self-pay | Admitting: Obstetrics & Gynecology

## 2012-11-14 VITALS — BP 115/72 | Wt 225.0 lb

## 2012-11-14 DIAGNOSIS — Z3492 Encounter for supervision of normal pregnancy, unspecified, second trimester: Secondary | ICD-10-CM

## 2012-11-14 DIAGNOSIS — Z3480 Encounter for supervision of other normal pregnancy, unspecified trimester: Secondary | ICD-10-CM

## 2012-11-14 DIAGNOSIS — O34219 Maternal care for unspecified type scar from previous cesarean delivery: Secondary | ICD-10-CM

## 2012-11-14 NOTE — Progress Notes (Signed)
Routine OB visit. Glucola, She recently had TDAP, and CBC, HIV, RPR. Good FM. Discuss weight gain and risks.

## 2012-11-14 NOTE — Progress Notes (Signed)
[id number] 

## 2012-11-15 LAB — GLUCOSE TOLERANCE, 1 HOUR (50G) W/O FASTING: Glucose, 1 Hour GTT: 91 mg/dL (ref 70–140)

## 2012-11-18 ENCOUNTER — Telehealth: Payer: Self-pay | Admitting: *Deleted

## 2012-11-18 ENCOUNTER — Encounter: Payer: Self-pay | Admitting: Obstetrics & Gynecology

## 2012-11-18 DIAGNOSIS — O2623 Pregnancy care for patient with recurrent pregnancy loss, third trimester: Secondary | ICD-10-CM

## 2012-11-18 NOTE — Telephone Encounter (Signed)
Message copied by Barbara Cower on Mon Nov 18, 2012  3:01 PM ------      Message from: Adam Phenix      Created: Wed Oct 30, 2012  1:02 PM       Patient has abnl protein S. Spoke to MFM; offer pt appt at [facility] for consult re. Risk of clot and possible restart Lovenox            Thanks, JGA ------

## 2012-11-18 NOTE — Telephone Encounter (Deleted)
Message copied by Barbara Cower on Mon Nov 18, 2012  2:54 PM ------      Message from: Adam Phenix      Created: Wed Oct 30, 2012  1:02 PM       Patient has abnl protein S. Spoke to MFM; offer pt appt at [facility] for consult re. Risk of clot and possible restart Lovenox            Thanks, JGA ------

## 2012-11-18 NOTE — Telephone Encounter (Signed)
Called patient and she was told by the last two physicians that she saw that her numbers were not low enough to require her to restart Lovenox.  She is confused and feels like it is kind of late in her pregnancy to be starting this now.  Please advise me on what to tell her as she has gotten two answers one way and one answer the other.

## 2012-11-19 NOTE — Telephone Encounter (Signed)
Spoke to patient and she agrees to set up appointment at [facility].

## 2012-11-19 NOTE — Telephone Encounter (Signed)
It is difficult to diagnose Protein S deficiency in pregnancy as because Protein S activity decreases due to reductions in total and free protein S antigen during pregnancy.  Her total Protein S was 49 (normal 60-150); activity 51% (normal 69-129); this is expected in pregnancy. She does not have any history of thromboembolic events.  Because there is a difference in recommendation, it would be best if patient has a MFM consultation to decide if she really does need Lovenox.   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  She was on Lovenox 40 mg [place] daily for entire last pregnancy; no postpartum coagulation. She took leftover Lovenox for first trimester this pregnancy, did not have any medication for second trimester. Thrombophilia panel drawn on 10/03/12; normal APLA antibody levels, negative for other tests (Protein S and activity were decreased, I initially thought this was consistent with what happens in pregnancy and felt there was no need for further anticoagulation).   Please schedule MFM consultation for patient.   Jaynie Collins, MD, FACOG Attending Obstetrician & Gynecologist Faculty Practice, [facility]

## 2012-11-19 NOTE — Addendum Note (Signed)
Addended by: Barbara Cower on: 11/19/2012 04:23 PM   Modules accepted: Orders

## 2012-11-26 ENCOUNTER — Other Ambulatory Visit: Payer: Self-pay | Admitting: Obstetrics & Gynecology

## 2012-11-26 ENCOUNTER — Ambulatory Visit (HOSPITAL_COMMUNITY)
Admission: RE | Admit: 2012-11-26 | Discharge: 2012-11-26 | Disposition: A | Discharge status: Home / Self Care | Payer: Medicaid Other | Source: Ambulatory Visit | Attending: Obstetrics & Gynecology | Admitting: Obstetrics & Gynecology

## 2012-11-26 VITALS — BP 112/65 | HR 67 | Wt 226.2 lb

## 2012-11-26 DIAGNOSIS — Z9884 Bariatric surgery status: Secondary | ICD-10-CM

## 2012-11-26 DIAGNOSIS — O2620 Pregnancy care for patient with recurrent pregnancy loss, unspecified trimester: Secondary | ICD-10-CM | POA: Insufficient documentation

## 2012-11-26 DIAGNOSIS — D689 Coagulation defect, unspecified: Secondary | ICD-10-CM | POA: Insufficient documentation

## 2012-11-26 DIAGNOSIS — O2622 Pregnancy care for patient with recurrent pregnancy loss, second trimester: Secondary | ICD-10-CM

## 2012-11-26 DIAGNOSIS — O09529 Supervision of elderly multigravida, unspecified trimester: Secondary | ICD-10-CM

## 2012-11-26 DIAGNOSIS — D6859 Other primary thrombophilia: Secondary | ICD-10-CM | POA: Insufficient documentation

## 2012-11-26 NOTE — Consult Note (Signed)
Maternal Fetal Medicine Consultation  Requesting Provider(s): Jaynie Collins, MD  Reason for consultation: Hx of recurrent abortions, ? Protein S deficiency   HPI: Tiffany Ayers is a 37 yo [id number] currently at 38 3/7 weeks seen today for recommendations regarding Lovenox prophylaxis due to ? Protein S deficiency and recurrent SABs.  Her past OB history is remarkable for a prior term C-section due to Non reassuring fetal tracing in 2004 followed by 4 subsequent early SABs.  She underwent an evaluation by [facility] due to recurrent losses.  She was told that on one occasion she had a "slight" elevation in anticardiolipin antibodies, but this was not confirmed on subsequent testing. She was treated with Heparin empirically during her last pregnancy in 2013 that resulted in a term repeat C-section without complications.  In the interim, she lost her insurance but took "left-over" Lovenox during the first trimester of this pregnancy and later stopped.  She had repeat work up for thrombophilia this pregnancy was was within normal limits except for mildly decreased Protein S levels (51% protein S activity level and Protein S level of 49).  She denies a personal or family history of thromboembolism.  Her current pregnancy has otherwise been uncomplicated.  She is without complaints today.  OB History: OB History   Grav Para Term Preterm Abortions TAB SAB Ect Mult Living   7 2 2  4  4   2       PMH:  Past Medical History  Diagnosis Date  . Anti-phospholipid antibody syndrome   . 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 S/P cesarean section 08/21/2011    PSH:  Past Surgical History  Procedure Laterality Date  . Laparoscopic gastric banding    . Cesarean section  2004  . Cesarean section  08/21/2011    Procedure: CESAREAN SECTION;  Surgeon: Sherron Monday, MD;  Location: [facility];  Service: Gynecology;  Laterality: N/A;  . Knee arthroscopy  06/05/1998  . Tonsillectomy     Meds: Current outpatient  prescriptions:PENICILLIN V POTASSIUM PO, Take 4 tablets by mouth daily., Disp: , Rfl: ;  Prenatal Vit-Fe Fumarate-FA (MULTIVITAMIN-PRENATAL) 27-0.8 MG TABS, Take 1 tablet by mouth daily at 12 noon., Disp: , Rfl: ;  [DISCONTINUED] enoxaparin (LOVENOX) 40 MG/0.4ML SOLN, Inject 40 mg into the skin daily., Disp: , Rfl:  Allergies:  Allergies  Allergen Reactions  . Vicodin (Hydrocodone-Acetaminophen) Itching and Nausea Only   FH:  Family History  Problem Relation Age of Onset  . Diabetes Maternal Grandmother   . Cleft lip Sister   . Heart disease Maternal Grandmother   . Heart disease Maternal Grandfather    Soc: denies tobacco, ETOH or illicit drug use  Review of Systems: no vaginal bleeding or cramping/contractions, no LOF, no nausea/vomiting. All other systems reviewed and are negative.  PNL: A -- -- NEGATIVE -- NON REAC -- (05/01 1026)   PE:   Filed Vitals:   11/26/12 1319  BP: 112/65  Pulse: 67    A/P: 1) Single IUP at 29 3/7 weeks         2) Recurrent abortions         3) ? Protein S Deficiency / antiphospholipid antibody syndrome - based on laboratory results, the patient does not meet the criteria for antiphospholipid antibody syndrome.  It appears that she was treated empirically with her last pregnancy.  Protein S values normally decreases normally during pregnancy and therefore screening in the non pregnant state is the most reliable. If screening in pregnancy  is necessary, cutoff values for free Protein S antigen levels in the second and third trimesters have been identified at less than 30% and less than 24% respectively.  Based on this, would not recommend initiating Lovenox prophylaxis.  The patient appears to be comfortable with this plan.  Due to the ? History of an elevated ACL in the past, would recommend a screening ultrasound for fetal growth in the 3rd trimester.  I have taken the liberty to schedule this in 3-4 weeks at our office.  Thank you for the  opportunity to be a part of the care of Tiffany Ayers. Please contact our office if we can be of further assistance.   I spent approximately 30 minutes with this patient with over 50% of time spent in face-to-face counseling.  Alpha Gula, MD Maternal Fetal Medicine

## 2012-12-04 ENCOUNTER — Ambulatory Visit (INDEPENDENT_AMBULATORY_CARE_PROVIDER_SITE_OTHER): Payer: Medicaid Other | Admitting: Obstetrics & Gynecology

## 2012-12-04 VITALS — BP 100/65 | Wt 231.0 lb

## 2012-12-04 DIAGNOSIS — Z302 Encounter for sterilization: Secondary | ICD-10-CM

## 2012-12-04 DIAGNOSIS — O34219 Maternal care for unspecified type scar from previous cesarean delivery: Secondary | ICD-10-CM

## 2012-12-04 DIAGNOSIS — O0930 Supervision of pregnancy with insufficient antenatal care, unspecified trimester: Secondary | ICD-10-CM

## 2012-12-04 DIAGNOSIS — O0933 Supervision of pregnancy with insufficient antenatal care, third trimester: Secondary | ICD-10-CM

## 2012-12-04 NOTE — Patient Instructions (Signed)
Tetanus, Diphtheria (Td) or Tetanus, Diphtheria, Pertussis (Tdap) Vaccine What You Need to Know WHY GET VACCINATED? Tetanus, diphtheria and pertussis can be very serious diseases. TETANUS (Lockjaw) causes painful muscle spasms and stiffness, usually all over the body.  Tetanus can lead to tightening of muscles in the head and neck so the victim cannot open his mouth or swallow, or sometimes even breathe. Tetanus kills about 1 out of 5 people who are infected. DIPHTHERIA can cause a thick membrane to cover the back of the throat.  Diphtheria can lead to breathing problems, paralysis, heart failure, and even death. PERTUSSIS (Whooping Cough) causes severe coughing spells which can lead to difficulty breathing, vomiting, and disturbed sleep.  Pertussis can lead to weight loss, incontinence, rib fractures and passing out from violent coughing. Up to 2 in 100 adolescents and 5 in 100 adults with pertussis are hospitalized or have complications, including pneumonia and death. These 3 diseases are all caused by bacteria. Diphtheria and pertussis are spread from person to person. Tetanus enters the body through cuts, scratches, or wounds. The [place] saw as many as 200,000 cases a year of diphtheria and pertussis before vaccines were available, and hundreds of cases of tetanus. Since then, tetanus and diphtheria cases have dropped by about 99% and pertussis cases by about 92%. Children 6 years of age and younger get DTaP vaccine to protect them from these three diseases. But older children, adolescents, and adults need protection too. VACCINES FOR ADOLESCENTS AND ADULTS: TD AND TDAP Two vaccines are available to protect people 7 years of age and older from these diseases:  Td vaccine has been used for many years. It protects against tetanus and diphtheria.  Tdap vaccine was licensed in 2005. It is the first vaccine for adolescents and adults that protects against pertussis as well as tetanus and  diphtheria. A Td booster dose is recommended every 10 years. Tdap is given only once. WHICH VACCINE, AND WHEN? Ages 7 through 18 years  A dose of Tdap is recommended at age 11 or 12. This dose could be given as early as age 7 for children who missed one or more childhood doses of DTaP.  Children and adolescents who did not get a complete series of DTaP shots by age 7 should complete the series using a combination of Td and Tdap. Age 19 years and Older  All adults should get a booster dose of Td every 10 years. Adults under 65 who have never gotten Tdap should get a dose of Tdap as their next booster dose. Adults 65 and older may get one booster dose of Tdap.  Adults (including women who may become pregnant and adults 65 and older) who expect to have close contact with a baby younger than 12 months of age should get a dose of Tdap to help protect the baby from pertussis.  Healthcare professionals who have direct patient contact in hospitals or clinics should get one dose of Tdap. Protection After a Wound  A person who gets a severe cut or burn might need a dose of Td or Tdap to prevent tetanus infection. Tdap should be used for anyone who has never had a dose previously. Td should be used if Tdap is not available, or for:  Anybody who has already had a dose of Tdap.  Children 7 through 9 years of age who completed the childhood DTaP series.  Adults 65 and older. Pregnant Women  Pregnant women who have never had a dose of Tdap   should get one, after the 20th week of gestation and preferably during the 3rd trimester. If they do not get Tdap during their pregnancy they should get a dose as soon as possible after delivery. Pregnant women who have previously received Tdap and need tetanus or diphtheria vaccine while pregnant should get Td. Tdap and Td may be given at the same time as other vaccines. SOME PEOPLE SHOULD NOT BE VACCINATED OR SHOULD WAIT  Anyone who has had a life-threatening  allergic reaction after a dose of any tetanus, diphtheria, or pertussis containing vaccine should not get Td or Tdap.  Anyone who has a severe allergy to any component of a vaccine should not get that vaccine. Tell your doctor if the person getting the vaccine has any severe allergies.  Anyone who had a coma, or long or multiple seizures within 7 days after a dose of DTP or DTaP should not get Tdap, unless a cause other than the vaccine was found. These people may get Td.  Talk to your doctor if the person getting either vaccine:  Has epilepsy or another nervous system problem.  Had severe swelling or severe pain after a previous dose of DTP, DTaP, DT, Td, or Tdap vaccine.  Has had Guillain Barr Syndrome (GBS). Anyone who has a moderate or severe illness on the day the shot is scheduled should usually wait until they recover before getting Tdap or Td vaccine. A person with a mild illness or low fever can usually be vaccinated. WHAT ARE THE RISKS FROM TDAP AND TD VACCINES? With a vaccine, as with any medicine, there is always a small risk of a life-threatening allergic reaction or other serious problem. Brief fainting spells and related symptoms (such as jerking movements) can happen after any medical procedure, including vaccination. Sitting or lying down for about 15 minutes after a vaccination can help prevent fainting and injuries caused by falls. Tell your doctor if the patient feels dizzy or lightheaded, or has vision changes or ringing in the ears. Getting tetanus, diphtheria, or pertussis would be much more likely to lead to severe problems than getting either Td or Tdap vaccine. Problems reported after Td and Tdap vaccines are listed below. Mild Problems (noticeable, but did not interfere with activities) Tdap  Pain (about 3 in 4 adolescents and 2 in 3 adults).  Redness or swelling (about 1 in 5).  Mild fever of at least 100.4 F (38 C) (up to about 1 in 25 adolescents and 1 in  100 adults).  Headache (about 4 in 10 adolescents and 3 in 10 adults).  Tiredness (about 1 in 3 adolescents and 1 in 4 adults).  Nausea, vomiting, diarrhea, or stomach ache (up to 1 in 4 adolescents and 1 in 10 adults).  Chills, body aches, sore joints, rash, or swollen glands (uncommon). Td  Pain (up to about 8 in 10).  Redness or swelling at the injection site (up to about 1 in 3).  Mild fever (up to about 1 in 15).  Headache or tiredness (uncommon). Moderate Problems (interfered with activities, but did not require medical attention) Tdap  Pain at the injection site (about 1 in 20 adolescents and 1 in 100 adults).  Redness or swelling at the injection site (up to about 1 in 16 adolescents and 1 in 25 adults).  Fever over 102 F (38.9 C) (about 1 in 100 adolescents and 1 in 250 adults).  Headache (1 in 300).  Nausea, vomiting, diarrhea, or stomach ache (up to 3   in 100 adolescents and 1 in 100 adults). Td  Fever over 102 F (38.9 C) (rare). Tdap or Td  Extensive swelling of the arm where the shot was given (up to about 3 in 100). Severe Problems (Unable to perform usual activities; required medical attention) Tdap or Td  Swelling, severe pain, bleeding, and redness in the arm where the shot was given (rare). A severe allergic reaction could occur after any vaccine. They are estimated to occur less than once in a million doses. WHAT IF THERE IS A SEVERE REACTION? What should I look for? Any unusual condition, such as a severe allergic reaction or a high fever. If a severe allergic reaction occurred, it would be within a few minutes to an hour after the shot. Signs of a serious allergic reaction can include difficulty breathing, weakness, hoarseness or wheezing, a fast heartbeat, hives, dizziness, paleness, or swelling of the throat. What should I do?  Call a doctor, or get the person to a doctor right away.  Tell your doctor what happened, the date and time it  happened, and when the vaccination was given.  Ask your provider to report the reaction by filing a Vaccine Adverse Event Reporting System (VAERS) form. Or, you can file this report through the VAERS website at www.vaers.hhs.gov or by calling 1-800-822-7967. VAERS does not provide medical advice. THE NATIONAL VACCINE INJURY COMPENSATION PROGRAM The National Vaccine Injury Compensation Program (VICP) was created in 1986. Persons who believe they may have been injured by a vaccine can learn about the program and about filing a claim by calling 1-800-338-2382 or visiting the VICP website at www.hrsa.gov/vaccinecompensation. HOW CAN I LEARN MORE?  Your doctor can give you the vaccine package insert or suggest other sources of information.  Call your local or state health department.  Contact the Centers for Disease Control and Prevention (CDC):  Call 1-800-232-4636 (1-800-CDC-INFO).  Visit the CDC website at www.cdc.gov/vaccines. CDC Td and Tdap Interim VIS (06/28/10) Document Released: 03/19/2006 Document Revised: 08/14/2011 Document Reviewed: 06/28/2010 Exit[employer] Patient Information 2014 [employer].  

## 2012-12-04 NOTE — Progress Notes (Signed)
P = 76 

## 2012-12-04 NOTE — Progress Notes (Signed)
No other complaints or concerns.  Fetal movement and labor precautions reviewed.  

## 2012-12-11 ENCOUNTER — Ambulatory Visit (HOSPITAL_COMMUNITY)
Admission: RE | Admit: 2012-12-11 | Discharge: 2012-12-11 | Disposition: A | Discharge status: Home / Self Care | Payer: Medicaid Other | Source: Ambulatory Visit | Attending: Family Medicine | Admitting: Family Medicine

## 2012-12-11 ENCOUNTER — Encounter (HOSPITAL_COMMUNITY): Payer: Self-pay

## 2012-12-11 DIAGNOSIS — O09529 Supervision of elderly multigravida, unspecified trimester: Secondary | ICD-10-CM | POA: Insufficient documentation

## 2012-12-11 DIAGNOSIS — D6859 Other primary thrombophilia: Secondary | ICD-10-CM

## 2012-12-11 DIAGNOSIS — O34219 Maternal care for unspecified type scar from previous cesarean delivery: Secondary | ICD-10-CM | POA: Insufficient documentation

## 2012-12-11 DIAGNOSIS — O2620 Pregnancy care for patient with recurrent pregnancy loss, unspecified trimester: Secondary | ICD-10-CM | POA: Insufficient documentation

## 2012-12-11 NOTE — Progress Notes (Signed)
[facility]  Indication: 37 yr old [id number] at [redacted]w[redacted]d with history of recurrent miscarriages for fetal growth.  Findings: 1. Single intrauterine pregnancy. 2. Estimated fetal weight is in the 53rd%. 3. Anterior placenta without evidence of previa. 4. Normal amniotic fluid index. 5. The limited anatomy survey is normal.  Recommendations:  1. Appropriate fetal growth. 2. Recurrent first trimester miscarriages: - previously counseled - slightly decreased protein S- previously counseled 3. Recommend follow up ultrasounds as clinically indicated  Eulis Foster, MD

## 2012-12-19 ENCOUNTER — Ambulatory Visit (INDEPENDENT_AMBULATORY_CARE_PROVIDER_SITE_OTHER): Payer: Medicaid Other | Admitting: Family Medicine

## 2012-12-19 VITALS — BP 119/68 | Wt 231.0 lb

## 2012-12-19 DIAGNOSIS — O34219 Maternal care for unspecified type scar from previous cesarean delivery: Secondary | ICD-10-CM

## 2012-12-19 DIAGNOSIS — Z3480 Encounter for supervision of other normal pregnancy, unspecified trimester: Secondary | ICD-10-CM

## 2012-12-19 NOTE — Patient Instructions (Signed)
Pregnancy - Third Trimester The third trimester of pregnancy (the last 3 months) is a period of the most rapid growth for you and your baby. The baby approaches a length of 20 inches and a weight of 6 to 10 pounds. The baby is adding on fat and getting ready for life outside your body. While inside, babies have periods of sleeping and waking, sucking thumbs, and hiccuping. You can often feel small contractions of the uterus. This is false labor. It is also called Braxton-Hicks contractions. This is like a practice for labor. The usual problems in this stage of pregnancy include more difficulty breathing, swelling of the hands and feet from water retention, and having to urinate more often because of the uterus and baby pressing on your bladder.  PRENATAL EXAMS  Blood work may continue to be done during prenatal exams. These tests are done to check on your health and the probable health of your baby. Blood work is used to follow your blood levels (hemoglobin). Anemia (low hemoglobin) is common during pregnancy. Iron and vitamins are given to help prevent this. You may also continue to be checked for diabetes. Some of the past blood tests may be done again.  The size of the uterus is measured during each visit. This makes sure your baby is growing properly according to your pregnancy dates.  Your blood pressure is checked every prenatal visit. This is to make sure you are not getting toxemia.  Your urine is checked every prenatal visit for infection, diabetes, and protein.  Your weight is checked at each visit. This is done to make sure gains are happening at the suggested rate and that you and your baby are growing normally.  Sometimes, an ultrasound is performed to confirm the position and the proper growth and development of the baby. This is a test done that bounces harmless sound waves off the baby so your caregiver can more accurately determine a due date.  Discuss the type of pain medicine and  anesthesia you will have during your labor and delivery.  Discuss the possibility and anesthesia if a cesarean section might be necessary.  Inform your caregiver if there is any mental or physical violence at home. Sometimes, a specialized non-stress test, contraction stress test, and biophysical profile are done to make sure the baby is not having a problem. Checking the amniotic fluid surrounding the baby is called an amniocentesis. The amniotic fluid is removed by sticking a needle into the belly (abdomen). This is sometimes done near the end of pregnancy if an early delivery is required. In this case, it is done to help make sure the baby's lungs are mature enough for the baby to live outside of the womb. If the lungs are not mature and it is unsafe to deliver the baby, an injection of cortisone medicine is given to the mother 1 to 2 days before the delivery. This helps the baby's lungs mature and makes it safer to deliver the baby. CHANGES OCCURING IN THE THIRD TRIMESTER OF PREGNANCY Your body goes through many changes during pregnancy. They vary from person to person. Talk to your caregiver about changes you notice and are concerned about.  During the last trimester, you have probably had an increase in your appetite. It is normal to have cravings for certain foods. This varies from person to person and pregnancy to pregnancy.  You may begin to get stretch marks on your hips, abdomen, and breasts. These are normal changes in the body   during pregnancy. There are no exercises or medicines to take which prevent this change.  Constipation may be treated with a stool softener or adding bulk to your diet. Drinking lots of fluids, fiber in vegetables, fruits, and whole grains are helpful.  Exercising is also helpful. If you have been very active up until your pregnancy, most of these activities can be continued during your pregnancy. If you have been less active, it is helpful to start an exercise  program such as walking. Consult your caregiver before starting exercise programs.  Avoid all smoking, alcohol, non-prescribed drugs, herbs and "street drugs" during your pregnancy. These chemicals affect the formation and growth of the baby. Avoid chemicals throughout the pregnancy to ensure the delivery of a healthy infant.  Backache, varicose veins, and hemorrhoids may develop or get worse.  You will tire more easily in the third trimester, which is normal.  The baby's movements may be stronger and more often.  You may become short of breath easily.  Your belly button may stick out.  A yellow discharge may leak from your breasts called colostrum.  You may have a bloody mucus discharge. This usually occurs a few days to a week before labor begins. HOME CARE INSTRUCTIONS   Keep your caregiver's appointments. Follow your caregiver's instructions regarding medicine use, exercise, and diet.  During pregnancy, you are providing food for you and your baby. Continue to eat regular, well-balanced meals. Choose foods such as meat, fish, milk and other low fat dairy products, vegetables, fruits, and whole-grain breads and cereals. Your caregiver will tell you of the ideal weight gain.  A physical sexual relationship may be continued throughout pregnancy if there are no other problems such as early (premature) leaking of amniotic fluid from the membranes, vaginal bleeding, or belly (abdominal) pain.  Exercise regularly if there are no restrictions. Check with your caregiver if you are unsure of the safety of your exercises. Greater weight gain will occur in the last 2 trimesters of pregnancy. Exercising helps:  Control your weight.  Get you in shape for labor and delivery.  You lose weight after you deliver.  Rest a lot with legs elevated, or as needed for leg cramps or low back pain.  Wear a good support or jogging bra for breast tenderness during pregnancy. This may help if worn during  sleep. Pads or tissues may be used in the bra if you are leaking colostrum.  Do not use hot tubs, steam rooms, or saunas.  Wear your seat belt when driving. This protects you and your baby if you are in an accident.  Avoid raw meat, cat litter boxes and soil used by cats. These carry germs that can cause birth defects in the baby.  It is easier to leak urine during pregnancy. Tightening up and strengthening the pelvic muscles will help with this problem. You can practice stopping your urination while you are going to the bathroom. These are the same muscles you need to strengthen. It is also the muscles you would use if you were trying to stop from passing gas. You can practice tightening these muscles up 10 times a set and repeating this about 3 times per day. Once you know what muscles to tighten up, do not perform these exercises during urination. It is more likely to cause an infection by backing up the urine.  Ask for help if you have financial, counseling, or nutritional needs during pregnancy. Your caregiver will be able to offer counseling for these   needs as well as refer you for other special needs.  Make a list of emergency phone numbers and have them available.  Plan on getting help from family or friends when you go home from the hospital.  Make a trial run to the hospital.  Take prenatal classes with the father to understand, practice, and ask questions about the labor and delivery.  Prepare the baby's room or nursery.  Do not travel out of the city unless it is absolutely necessary and with the advice of your caregiver.  Wear only low or no heal shoes to have better balance and prevent falling. MEDICINES AND DRUG USE IN PREGNANCY  Take prenatal vitamins as directed. The vitamin should contain 1 milligram of folic acid. Keep all vitamins out of reach of children. Only a couple vitamins or tablets containing iron may be fatal to a baby or young child when ingested.  Avoid use  of all medicines, including herbs, over-the-counter medicines, not prescribed or suggested by your caregiver. Only take over-the-counter or prescription medicines for pain, discomfort, or fever as directed by your caregiver. Do not use aspirin, ibuprofen or naproxen unless approved by your caregiver.  Let your caregiver also know about herbs you may be using.  Alcohol is related to a number of birth defects. This includes fetal alcohol syndrome. All alcohol, in any form, should be avoided completely. Smoking will cause low birth rate and premature babies.  Illegal drugs are very harmful to the baby. They are absolutely forbidden. A baby born to an addicted mother will be addicted at birth. The baby will go through the same withdrawal an adult does. SEEK MEDICAL CARE IF: You have any concerns or worries during your pregnancy. It is better to call with your questions if you feel they cannot wait, rather than worry about them. SEEK IMMEDIATE MEDICAL CARE IF:   An unexplained oral temperature above 102 F (38.9 C) develops, or as your caregiver suggests.  You have leaking of fluid from the vagina. If leaking membranes are suspected, take your temperature and tell your caregiver of this when you call.  There is vaginal spotting, bleeding or passing clots. Tell your caregiver of the amount and how many pads are used.  You develop a bad smelling vaginal discharge with a change in the color from clear to white.  You develop vomiting that lasts more than 24 hours.  You develop chills or fever.  You develop shortness of breath.  You develop burning on urination.  You loose more than 2 pounds of weight or gain more than 2 pounds of weight or as suggested by your caregiver.  You notice sudden swelling of your face, hands, and feet or legs.  You develop belly (abdominal) pain. Round ligament discomfort is a common non-cancerous (benign) cause of abdominal pain in pregnancy. Your caregiver still  must evaluate you.  You develop a severe headache that does not go away.  You develop visual problems, blurred or double vision.  If you have not felt your baby move for more than 1 hour. If you think the baby is not moving as much as usual, eat something with sugar in it and lie down on your left side for an hour. The baby should move at least 4 to 5 times per hour. Call right away if your baby moves less than that.  You fall, are in a car accident, or any kind of trauma.  There is mental or physical violence at home. Document Released: 05/16/2001   Document Revised: 02/14/2012 Document Reviewed: 11/18/2008 [employer] Patient Information 2014 [employer].  Breastfeeding A change in hormones during your pregnancy causes growth of your breast tissue and an increase in number and size of milk ducts. The hormone prolactin allows proteins, sugars, and fats from your blood supply to make breast milk in your milk-producing glands. The hormone progesterone prevents breast milk from being released before the birth of your baby. After the birth of your baby, your progesterone level decreases allowing breast milk to be released. Thoughts of your baby, as well as his or her sucking or crying, can stimulate the release of milk from the milk-producing glands. Deciding to breastfeed (nurse) is one of the best choices you can make for you and your baby. The information that follows gives a brief review of the benefits, as well as other important skills to know about breastfeeding. BENEFITS OF BREASTFEEDING For your baby  The first milk (colostrum) helps your baby's digestive system function better.   There are antibodies in your milk that help your baby fight off infections.   Your baby has a lower incidence of asthma, allergies, and sudden infant death syndrome (SIDS).   The nutrients in breast milk are better for your baby than infant formulas.  Breast milk improves your baby's brain development.    Your baby will have less gas, colic, and constipation.  Your baby is less likely to develop other conditions, such as childhood obesity, asthma, or diabetes mellitus. For you  Breastfeeding helps develop a very special bond between you and your baby.   Breastfeeding is convenient, always available at the correct temperature, and costs nothing.   Breastfeeding helps to burn calories and helps you lose the weight gained during pregnancy.   Breastfeeding makes your uterus contract back down to normal size faster and slows bleeding following delivery.   Breastfeeding mothers have a lower risk of developing osteoporosis or breast or ovarian cancer later in life.  BREASTFEEDING FREQUENCY  A healthy, full-term baby may breastfeed as often as every hour or space his or her feedings to every 3 hours. Breastfeeding frequency will vary from baby to baby.   Newborns should be fed no less than every 2 3 hours during the day and every 4 5 hours during the night. You should breastfeed a minimum of 8 feedings in a 24 hour period.  Awaken your baby to breastfeed if it has been 3 4 hours since the last feeding.  Breastfeed when you feel the need to reduce the fullness of your breasts or when your newborn shows signs of hunger. Signs that your baby may be hungry include:  Increased alertness or activity.  Stretching.  Movement of the head from side to side.  Movement of the head and opening of the mouth when the corner of the mouth or cheek is stroked (rooting).  Increased sucking sounds, smacking lips, cooing, sighing, or squeaking.  Hand-to-mouth movements.  Increased sucking of fingers or hands.  Fussing.  Intermittent crying.  Signs of extreme hunger will require calming and consoling before you try to feed your baby. Signs of extreme hunger may include:  Restlessness.  A loud, strong cry.  Screaming.  Frequent feeding will help you make more milk and will help prevent  problems, such as sore nipples and engorgement of the breasts.  BREASTFEEDING   Whether lying down or sitting, be sure that the baby's abdomen is facing your abdomen.   Support your breast with 4 fingers under your breast   and your thumb above your nipple. Make sure your fingers are well away from your nipple and your baby's mouth.   Stroke your baby's lips gently with your finger or nipple.   When your baby's mouth is open wide enough, place all of your nipple and as much of the colored area around your nipple (areola) as possible into your baby's mouth.  More areola should be visible above his or her upper lip than below his or her lower lip.  Your baby's tongue should be between his or her lower gum and your breast.  Ensure that your baby's mouth is correctly positioned around the nipple (latched). Your baby's lips should create a seal on your breast.  Signs that your baby has effectively latched onto your nipple include:  Tugging or sucking without pain.  Swallowing heard between sucks.  Absent click or smacking sound.  Muscle movement above and in front of his or her ears with sucking.  Your baby must suck about 2 3 minutes in order to get your milk. Allow your baby to feed on each breast as long as he or she wants. Nurse your baby until he or she unlatches or falls asleep at the first breast, then offer the second breast.  Signs that your baby is full and satisfied include:  A gradual decrease in the number of sucks or complete cessation of sucking.  Falling asleep.  Extension or relaxation of his or her body.  Retention of a small amount of milk in his or her mouth.  Letting go of your breast by himself or herself.  Signs of effective breastfeeding in you include:  Breasts that have increased firmness, weight, and size prior to feeding.  Breasts that are softer after nursing.  Increased milk volume, as well as a change in milk consistency and color by the 5th  day of breastfeeding.  Breast fullness relieved by breastfeeding.  Nipples are not sore, cracked, or bleeding.  If needed, break the suction by putting your finger into the corner of your baby's mouth and sliding your finger between his or her gums. Then, remove your breast from his or her mouth.  It is common for babies to spit up a small amount after a feeding.  Babies often swallow air during feeding. This can make babies fussy. Burping your baby between breasts can help with this.  Vitamin D supplements are recommended for babies who get only breast milk.  Avoid using a pacifier during your baby's first 4 6 weeks.  Avoid supplemental feedings of water, formula, or juice in place of breastfeeding. Breast milk is all the food your baby needs. It is not necessary for your baby to have water or formula. Your breasts will make more milk if supplemental feedings are avoided during the early weeks. HOW TO TELL WHETHER YOUR BABY IS GETTING ENOUGH BREAST MILK Wondering whether or not your baby is getting enough milk is a common concern among mothers. You can be assured that your baby is getting enough milk if:   Your baby is actively sucking and you hear swallowing.   Your baby seems relaxed and satisfied after a feeding.   Your baby nurses at least 8 12 times in a 24 hour time period.  During the first 3 5 days of age:  Your baby is wetting at least 3 5 diapers in a 24 hour period. The urine should be clear and pale yellow.  Your baby is having at least 3 4 stools in   a 24 hour period. The stool should be soft and yellow.  At 5 7 days of age, your baby is having at least 3 6 stools in a 24 hour period. The stool should be seedy and yellow by 5 days of age.  Your baby has a weight loss less than 7 10% during the first 3 days of age.  Your baby does not lose weight after 3 7 days of age.  Your baby gains 4 7 ounces each week after he or she is 4 days of age.  Your baby gains weight  by 5 days of age and is back to birth weight within 2 weeks. ENGORGEMENT In the first week after your baby is born, you may experience extremely full breasts (engorgement). When engorged, your breasts may feel heavy, warm, or tender to the touch. Engorgement peaks within 24 48 hours after delivery of your baby.  Engorgement may be reduced by:  Continuing to breastfeed.  Increasing the frequency of breastfeeding.  Taking warm showers or applying warm, moist heat to your breasts just before each feeding. This increases circulation and helps the milk flow.   Gently massaging your breast before and during the feedings. With your fingertips, massage from your chest wall towards your nipple in a circular motion.   Ensuring that your baby empties at least one breast at every feeding. It also helps to start the next feeding on the opposite breast.   Expressing breast milk by hand or by using a breast pump to empty the breasts if your baby is sleepy, or not nursing well. You may also want to express milk if you are returning to work oryou feel you are getting engorged.  Ensuring your baby is latched on and positioned properly while breastfeeding. If you follow these suggestions, your engorgement should improve in 24 48 hours. If you are still experiencing difficulty, call your lactation consultant or caregiver.  CARING FOR YOURSELF Take care of your breasts.  Bathe or shower daily.   Avoid using soap on your nipples.   Wear a supportive bra. Avoid wearing underwire style bras.  Air dry your nipples for a 3 4minutes after each feeding.   Use only cotton bra pads to absorb breast milk leakage. Leaking of breast milk between feedings is normal.   Use only pure lanolin on your nipples after nursing. You do not need to wash it off before feeding your baby again. Another option is to express a few drops of breast milk and gently massage that milk into your nipples.  Continue breast  self-awareness checks. Take care of yourself.  Eat healthy foods. Alternate 3 meals with 3 snacks.  Avoid foods that you notice affect your baby in a bad way.  Drink milk, fruit juice, and water to satisfy your thirst (about 8 glasses a day).   Rest often, relax, and take your prenatal vitamins to prevent fatigue, stress, and anemia.  Avoid chewing and smoking tobacco.  Avoid alcohol and drug use.  Take over-the-counter and prescribed medicine only as directed by your caregiver or pharmacist. You should always check with your caregiver or pharmacist before taking any new medicine, vitamin, or herbal supplement.  Know that pregnancy is possible while breastfeeding. If desired, talk to your caregiver about family planning and safe birth control methods that may be used while breastfeeding. SEEK MEDICAL CARE IF:   You feel like you want to stop breastfeeding or have become frustrated with breastfeeding.  You have painful breasts or nipples.    Your nipples are cracked or bleeding.  Your breasts are red, tender, or warm.  You have a swollen area on either breast.  You have a fever or chills.  You have nausea or vomiting.  You have drainage from your nipples.  Your breasts do not become full before feedings by the 5th day after delivery.  You feel sad and depressed.  Your baby is too sleepy to eat well.  Your baby is having trouble sleeping.   Your baby is wetting less than 3 diapers in a 24 hour period.  Your baby has less than 3 stools in a 24 hour period.  Your baby's skin or the white part of his or her eyes becomes more yellow.   Your baby is not gaining weight by 5 days of age. MAKE SURE YOU:   Understand these instructions.  Will watch your condition.  Will get help right away if you are not doing well or get worse. Document Released: 05/22/2005 Document Revised: 02/14/2012 Document Reviewed: 12/27/2011 [employer] Patient Information 2014 [employer].  

## 2012-12-19 NOTE — Progress Notes (Signed)
P-73 

## 2012-12-19 NOTE — Progress Notes (Signed)
Becomes light-headed and faint in the am 2-3 hours after eating, cereal.  Improves with rest and eating.  Will add protein to am meal and see if that helps. Scheduled for RCS and BTL.

## 2013-01-03 ENCOUNTER — Ambulatory Visit: Payer: Medicaid Other | Admitting: Family Medicine

## 2013-01-03 ENCOUNTER — Encounter: Payer: Self-pay | Admitting: Family Medicine

## 2013-01-03 VITALS — BP 115/70 | Wt 236.0 lb

## 2013-01-03 DIAGNOSIS — O34219 Maternal care for unspecified type scar from previous cesarean delivery: Secondary | ICD-10-CM

## 2013-01-03 NOTE — Progress Notes (Signed)
P-86 Patient is doing well, she has increased her protein and fluid intake and feels like this is helping with the light headed feeling she has been having in the morning.  Otherwise she is doing well and she will follow up next week at which point we will do her third trimester cultures.  She was unable to stay for her appointment with the physician today due to an appointment she has in [place].

## 2013-01-04 NOTE — Progress Notes (Signed)
Pt left without being seen.

## 2013-01-04 NOTE — Assessment & Plan Note (Signed)
Not seen today

## 2013-01-10 ENCOUNTER — Encounter: Payer: Self-pay | Admitting: Obstetrics & Gynecology

## 2013-01-10 ENCOUNTER — Ambulatory Visit (INDEPENDENT_AMBULATORY_CARE_PROVIDER_SITE_OTHER): Payer: Medicaid Other | Admitting: Obstetrics & Gynecology

## 2013-01-10 VITALS — BP 117/67 | Wt 235.0 lb

## 2013-01-10 DIAGNOSIS — O34219 Maternal care for unspecified type scar from previous cesarean delivery: Secondary | ICD-10-CM

## 2013-01-10 DIAGNOSIS — O2620 Pregnancy care for patient with recurrent pregnancy loss, unspecified trimester: Secondary | ICD-10-CM

## 2013-01-10 DIAGNOSIS — O0930 Supervision of pregnancy with insufficient antenatal care, unspecified trimester: Secondary | ICD-10-CM

## 2013-01-10 NOTE — Progress Notes (Signed)
Routine visit. Good FM. Denies OB problems. Cervical cultures done today.

## 2013-01-10 NOTE — Progress Notes (Signed)
P-82 

## 2013-01-11 LAB — GC/CHLAMYDIA PROBE AMP
CT Probe RNA: NEGATIVE
GC Probe RNA: NEGATIVE

## 2013-01-13 LAB — CULTURE, BETA STREP (GROUP B ONLY)

## 2013-01-14 ENCOUNTER — Encounter: Payer: Self-pay | Admitting: Obstetrics & Gynecology

## 2013-01-17 ENCOUNTER — Ambulatory Visit (HOSPITAL_COMMUNITY)
Admission: RE | Admit: 2013-01-17 | Discharge: 2013-01-17 | Disposition: A | Discharge status: Home / Self Care | Payer: Medicaid Other | Source: Ambulatory Visit | Attending: Obstetrics & Gynecology | Admitting: Obstetrics & Gynecology

## 2013-01-17 ENCOUNTER — Ambulatory Visit (INDEPENDENT_AMBULATORY_CARE_PROVIDER_SITE_OTHER): Payer: Medicaid Other | Admitting: Obstetrics & Gynecology

## 2013-01-17 VITALS — BP 108/66 | Wt 237.0 lb

## 2013-01-17 DIAGNOSIS — O3663X1 Maternal care for excessive fetal growth, third trimester, fetus 1: Secondary | ICD-10-CM

## 2013-01-17 DIAGNOSIS — Z3689 Encounter for other specified antenatal screening: Secondary | ICD-10-CM | POA: Insufficient documentation

## 2013-01-17 DIAGNOSIS — O34219 Maternal care for unspecified type scar from previous cesarean delivery: Secondary | ICD-10-CM

## 2013-01-17 DIAGNOSIS — O0933 Supervision of pregnancy with insufficient antenatal care, third trimester: Secondary | ICD-10-CM

## 2013-01-17 DIAGNOSIS — O09523 Supervision of elderly multigravida, third trimester: Secondary | ICD-10-CM

## 2013-01-17 DIAGNOSIS — O09529 Supervision of elderly multigravida, unspecified trimester: Secondary | ICD-10-CM

## 2013-01-17 DIAGNOSIS — O0930 Supervision of pregnancy with insufficient antenatal care, unspecified trimester: Secondary | ICD-10-CM

## 2013-01-17 DIAGNOSIS — O3660X0 Maternal care for excessive fetal growth, unspecified trimester, not applicable or unspecified: Secondary | ICD-10-CM

## 2013-01-17 NOTE — Progress Notes (Signed)
P-65 

## 2013-01-17 NOTE — Patient Instructions (Signed)
Return to clinic for any obstetric concerns or go to [facility] for evaluation  

## 2013-01-17 NOTE — Progress Notes (Signed)
Cultures negative.  Ultrasound ordered for large for dates.  No other complaints or concerns.  Fetal movement and labor precautions reviewed.

## 2013-01-22 ENCOUNTER — Ambulatory Visit (INDEPENDENT_AMBULATORY_CARE_PROVIDER_SITE_OTHER): Payer: Medicaid Other | Admitting: Obstetrics and Gynecology

## 2013-01-22 ENCOUNTER — Encounter: Payer: Self-pay | Admitting: Obstetrics and Gynecology

## 2013-01-22 VITALS — BP 114/75 | Wt 238.0 lb

## 2013-01-22 DIAGNOSIS — O09529 Supervision of elderly multigravida, unspecified trimester: Secondary | ICD-10-CM

## 2013-01-22 DIAGNOSIS — O2623 Pregnancy care for patient with recurrent pregnancy loss, third trimester: Secondary | ICD-10-CM

## 2013-01-22 DIAGNOSIS — Z302 Encounter for sterilization: Secondary | ICD-10-CM

## 2013-01-22 DIAGNOSIS — O0933 Supervision of pregnancy with insufficient antenatal care, third trimester: Secondary | ICD-10-CM

## 2013-01-22 DIAGNOSIS — Z9884 Bariatric surgery status: Secondary | ICD-10-CM

## 2013-01-22 DIAGNOSIS — O34219 Maternal care for unspecified type scar from previous cesarean delivery: Secondary | ICD-10-CM

## 2013-01-22 DIAGNOSIS — O09523 Supervision of elderly multigravida, third trimester: Secondary | ICD-10-CM

## 2013-01-22 DIAGNOSIS — O0930 Supervision of pregnancy with insufficient antenatal care, unspecified trimester: Secondary | ICD-10-CM

## 2013-01-22 DIAGNOSIS — O2620 Pregnancy care for patient with recurrent pregnancy loss, unspecified trimester: Secondary | ICD-10-CM

## 2013-01-22 NOTE — Progress Notes (Signed)
P-78 

## 2013-01-22 NOTE — Progress Notes (Signed)
Patient doing well without complaints. FM/labor precautions reviewed. Discussed risks and benefits of surgery as I am the scheduled primary surgeon. Also discussed risks and benefits of BTL. All questions were answered

## 2013-01-23 ENCOUNTER — Other Ambulatory Visit (HOSPITAL_COMMUNITY): Payer: Medicaid Other

## 2013-01-27 ENCOUNTER — Encounter (HOSPITAL_COMMUNITY): Payer: Self-pay

## 2013-01-30 ENCOUNTER — Ambulatory Visit (INDEPENDENT_AMBULATORY_CARE_PROVIDER_SITE_OTHER): Payer: Medicaid Other | Admitting: Obstetrics & Gynecology

## 2013-01-30 ENCOUNTER — Encounter: Payer: Self-pay | Admitting: Obstetrics & Gynecology

## 2013-01-30 VITALS — BP 115/76 | Wt 241.0 lb

## 2013-01-30 DIAGNOSIS — Z3480 Encounter for supervision of other normal pregnancy, unspecified trimester: Secondary | ICD-10-CM

## 2013-01-30 DIAGNOSIS — O09529 Supervision of elderly multigravida, unspecified trimester: Secondary | ICD-10-CM

## 2013-01-30 DIAGNOSIS — O0930 Supervision of pregnancy with insufficient antenatal care, unspecified trimester: Secondary | ICD-10-CM

## 2013-01-30 DIAGNOSIS — O09523 Supervision of elderly multigravida, third trimester: Secondary | ICD-10-CM

## 2013-01-30 DIAGNOSIS — O0933 Supervision of pregnancy with insufficient antenatal care, third trimester: Secondary | ICD-10-CM

## 2013-01-30 NOTE — Progress Notes (Signed)
Routine visit. Good FM. No problems. Labor precautions reviewed. 

## 2013-01-30 NOTE — Progress Notes (Signed)
P - [phone] 

## 2013-01-31 ENCOUNTER — Encounter (HOSPITAL_COMMUNITY): Payer: Self-pay

## 2013-01-31 ENCOUNTER — Encounter (HOSPITAL_COMMUNITY)
Admission: RE | Admit: 2013-01-31 | Discharge: 2013-01-31 | Disposition: A | Discharge status: Home / Self Care | Payer: Medicaid Other | Source: Ambulatory Visit | Attending: Obstetrics and Gynecology | Admitting: Obstetrics and Gynecology

## 2013-01-31 VITALS — BP 120/80 | Ht 65.0 in | Wt 238.0 lb

## 2013-01-31 DIAGNOSIS — O2623 Pregnancy care for patient with recurrent pregnancy loss, third trimester: Secondary | ICD-10-CM

## 2013-01-31 DIAGNOSIS — O0933 Supervision of pregnancy with insufficient antenatal care, third trimester: Secondary | ICD-10-CM

## 2013-01-31 DIAGNOSIS — O34219 Maternal care for unspecified type scar from previous cesarean delivery: Secondary | ICD-10-CM

## 2013-01-31 DIAGNOSIS — Z302 Encounter for sterilization: Secondary | ICD-10-CM

## 2013-01-31 DIAGNOSIS — O09523 Supervision of elderly multigravida, third trimester: Secondary | ICD-10-CM

## 2013-01-31 HISTORY — DX: Other specified health status: Z78.9

## 2013-01-31 LAB — CBC
HCT: 34.9 % — ABNORMAL LOW (ref 36.0–46.0)
Hemoglobin: 11.6 g/dL — ABNORMAL LOW (ref 12.0–15.0)
MCH: 28.4 pg (ref 26.0–34.0)
MCHC: 33.2 g/dL (ref 30.0–36.0)
MCV: 85.3 fL (ref 78.0–100.0)
Platelets: 165 10*3/uL (ref 150–400)
RBC: 4.09 MIL/uL (ref 3.87–5.11)
RDW: 14.6 % (ref 11.5–15.5)
WBC: 6.6 10*3/uL (ref 4.0–10.5)

## 2013-01-31 LAB — TYPE AND SCREEN
ABO/RH(D): A POS
Antibody Screen: NEGATIVE

## 2013-01-31 LAB — RPR: RPR Ser Ql: NONREACTIVE

## 2013-01-31 NOTE — Patient Instructions (Addendum)
Your procedure is scheduled on:02/01/13  Enter through the Main Entrance at :8am Pick up desk phone and dial [phone] and inform us of your arrival.  Please call [phone] if you have any problems the morning of surgery.  Remember: Do not eat food or drink liquids, including water, after midnight:tonight    You may brush your teeth the morning of surgery.    DO NOT wear jewelry, eye make-up, lipstick,body lotion, or dark fingernail polish.  (Polished toes are ok) You may wear deodorant.  If you are to be admitted after surgery, leave suitcase in car until your room has been assigned. Patients discharged on the day of surgery will not be allowed to drive home. Wear loose fitting, comfortable clothes for your ride home.

## 2013-02-01 ENCOUNTER — Inpatient Hospital Stay (HOSPITAL_COMMUNITY): Payer: Medicaid Other | Admitting: Anesthesiology

## 2013-02-01 ENCOUNTER — Inpatient Hospital Stay (HOSPITAL_COMMUNITY)
Admission: RE | Admit: 2013-02-01 | Payer: Medicaid Other | Source: Ambulatory Visit | Admitting: Obstetrics and Gynecology

## 2013-02-01 ENCOUNTER — Encounter (HOSPITAL_COMMUNITY): Payer: Self-pay | Admitting: *Deleted

## 2013-02-01 ENCOUNTER — Encounter (HOSPITAL_COMMUNITY)
Admission: AD | Disposition: A | Discharge status: Home / Self Care | Payer: Self-pay | Source: Ambulatory Visit | Attending: Obstetrics & Gynecology

## 2013-02-01 ENCOUNTER — Encounter (HOSPITAL_COMMUNITY): Payer: Self-pay | Admitting: Anesthesiology

## 2013-02-01 ENCOUNTER — Inpatient Hospital Stay (HOSPITAL_COMMUNITY)
Admission: AD | Admit: 2013-02-01 | Discharge: 2013-02-03 | DRG: 765 | Disposition: A | Discharge status: Home / Self Care | Payer: Medicaid Other | Source: Ambulatory Visit | Attending: Obstetrics & Gynecology | Admitting: Obstetrics & Gynecology

## 2013-02-01 DIAGNOSIS — D689 Coagulation defect, unspecified: Secondary | ICD-10-CM | POA: Diagnosis present

## 2013-02-01 DIAGNOSIS — O9912 Other diseases of the blood and blood-forming organs and certain disorders involving the immune mechanism complicating childbirth: Secondary | ICD-10-CM

## 2013-02-01 DIAGNOSIS — O34219 Maternal care for unspecified type scar from previous cesarean delivery: Secondary | ICD-10-CM

## 2013-02-01 DIAGNOSIS — O0933 Supervision of pregnancy with insufficient antenatal care, third trimester: Secondary | ICD-10-CM

## 2013-02-01 DIAGNOSIS — O09523 Supervision of elderly multigravida, third trimester: Secondary | ICD-10-CM

## 2013-02-01 DIAGNOSIS — Z302 Encounter for sterilization: Secondary | ICD-10-CM

## 2013-02-01 DIAGNOSIS — D6859 Other primary thrombophilia: Secondary | ICD-10-CM

## 2013-02-01 DIAGNOSIS — O09529 Supervision of elderly multigravida, unspecified trimester: Secondary | ICD-10-CM

## 2013-02-01 DIAGNOSIS — O99844 Bariatric surgery status complicating childbirth: Secondary | ICD-10-CM | POA: Diagnosis present

## 2013-02-01 DIAGNOSIS — O2623 Pregnancy care for patient with recurrent pregnancy loss, third trimester: Secondary | ICD-10-CM

## 2013-02-01 SURGERY — Surgical Case
Anesthesia: Spinal | Site: Abdomen | Laterality: Bilateral | Wound class: Clean Contaminated

## 2013-02-01 SURGERY — Surgical Case
Anesthesia: Regional

## 2013-02-01 MED ORDER — KETOROLAC TROMETHAMINE 60 MG/2ML IM SOLN
60.0000 mg | Freq: Once | INTRAMUSCULAR | Status: AC | PRN
  Administered 2013-02-01: 60 mg via INTRAMUSCULAR

## 2013-02-01 MED ORDER — WITCH HAZEL-GLYCERIN EX PADS: 1.0000 "application " | MEDICATED_PAD | CUTANEOUS | Status: DC | PRN

## 2013-02-01 MED ORDER — FENTANYL CITRATE 0.05 MG/ML IJ SOLN
INTRAMUSCULAR | Status: DC | PRN
  Administered 2013-02-01: 25 ug via INTRATHECAL

## 2013-02-01 MED ORDER — FENTANYL CITRATE 0.05 MG/ML IJ SOLN
INTRAMUSCULAR | Status: DC | PRN
  Administered 2013-02-01: 75 ug via INTRAVENOUS

## 2013-02-01 MED ORDER — LACTATED RINGERS IV SOLN
INTRAVENOUS | Status: DC
  Administered 2013-02-01: 13:00:00 via INTRAVENOUS

## 2013-02-01 MED ORDER — SODIUM CHLORIDE 0.9 % IJ SOLN: 3.0000 mL | INTRAMUSCULAR | Status: DC | PRN

## 2013-02-01 MED ORDER — SCOPOLAMINE 1 MG/3DAYS TD PT72
1.0000 | MEDICATED_PATCH | Freq: Once | TRANSDERMAL | Status: DC
  Filled 2013-02-01: qty 1

## 2013-02-01 MED ORDER — IBUPROFEN 600 MG PO TABS
600.0000 mg | ORAL_TABLET | Freq: Four times a day (QID) | ORAL | Status: DC
  Administered 2013-02-02 – 2013-02-03 (×5): 600 mg via ORAL
  Filled 2013-02-01 (×5): qty 1

## 2013-02-01 MED ORDER — CEFAZOLIN SODIUM-DEXTROSE 2-3 GM-% IV SOLR
2.0000 g | INTRAVENOUS | Status: AC
  Administered 2013-02-01: 2 g via INTRAVENOUS
  Filled 2013-02-01: qty 50

## 2013-02-01 MED ORDER — OXYTOCIN 10 UNIT/ML IJ SOLN
40.0000 [IU] | INTRAVENOUS | Status: DC | PRN
  Administered 2013-02-01: 40 [IU] via INTRAVENOUS

## 2013-02-01 MED ORDER — FAMOTIDINE IN NACL 20-0.9 MG/50ML-% IV SOLN
20.0000 mg | Freq: Once | INTRAVENOUS | Status: AC
  Administered 2013-02-01: 20 mg via INTRAVENOUS
  Filled 2013-02-01: qty 50

## 2013-02-01 MED ORDER — TETANUS-DIPHTH-ACELL PERTUSSIS 5-2.5-18.5 LF-MCG/0.5 IM SUSP: 0.5000 mL | Freq: Once | INTRAMUSCULAR | Status: DC

## 2013-02-01 MED ORDER — ONDANSETRON HCL 4 MG/2ML IJ SOLN: 4.0000 mg | INTRAMUSCULAR | Status: DC | PRN

## 2013-02-01 MED ORDER — SCOPOLAMINE 1 MG/3DAYS TD PT72
1.0000 | MEDICATED_PATCH | Freq: Once | TRANSDERMAL | Status: DC
  Administered 2013-02-01: 1.5 mg via TRANSDERMAL
  Filled 2013-02-01 (×2): qty 1

## 2013-02-01 MED ORDER — ONDANSETRON HCL 4 MG PO TABS: 4.0000 mg | ORAL_TABLET | ORAL | Status: DC | PRN

## 2013-02-01 MED ORDER — OXYCODONE-ACETAMINOPHEN 5-325 MG PO TABS
1.0000 | ORAL_TABLET | ORAL | Status: DC | PRN
  Administered 2013-02-01: 1 via ORAL
  Administered 2013-02-02: 2 via ORAL
  Administered 2013-02-03: 1 via ORAL
  Administered 2013-02-03: 2 via ORAL
  Filled 2013-02-01: qty 2
  Filled 2013-02-01: qty 1
  Filled 2013-02-01: qty 2
  Filled 2013-02-01: qty 1

## 2013-02-01 MED ORDER — MORPHINE SULFATE (PF) 0.5 MG/ML IJ SOLN
INTRAMUSCULAR | Status: DC | PRN
Start: 2013-02-01 — End: 2013-02-01
  Administered 2013-02-01: 4.85 mg via INTRAVENOUS

## 2013-02-01 MED ORDER — ONDANSETRON HCL 4 MG/2ML IJ SOLN: 4.0000 mg | Freq: Three times a day (TID) | INTRAMUSCULAR | Status: DC | PRN

## 2013-02-01 MED ORDER — LACTATED RINGERS IV SOLN
INTRAVENOUS | Status: DC | PRN
  Administered 2013-02-01: 10:00:00 via INTRAVENOUS

## 2013-02-01 MED ORDER — PRENATAL MULTIVITAMIN CH
1.0000 | ORAL_TABLET | Freq: Every day | ORAL | Status: DC
  Administered 2013-02-02: 1 via ORAL
  Filled 2013-02-01 (×2): qty 1

## 2013-02-01 MED ORDER — DIPHENHYDRAMINE HCL 25 MG PO CAPS: 25.0000 mg | ORAL_CAPSULE | Freq: Four times a day (QID) | ORAL | Status: DC | PRN

## 2013-02-01 MED ORDER — FENTANYL CITRATE 0.05 MG/ML IJ SOLN
25.0000 ug | INTRAMUSCULAR | Status: DC | PRN
  Administered 2013-02-01: 50 ug via INTRAVENOUS

## 2013-02-01 MED ORDER — NALOXONE HCL 0.4 MG/ML IJ SOLN: 0.4000 mg | INTRAMUSCULAR | Status: DC | PRN

## 2013-02-01 MED ORDER — DIBUCAINE 1 % RE OINT: 1.0000 "application " | TOPICAL_OINTMENT | RECTAL | Status: DC | PRN

## 2013-02-01 MED ORDER — MENTHOL 3 MG MT LOZG: 1.0000 | LOZENGE | OROMUCOSAL | Status: DC | PRN

## 2013-02-01 MED ORDER — PHENYLEPHRINE HCL 10 MG/ML IJ SOLN
INTRAMUSCULAR | Status: DC | PRN
  Administered 2013-02-01: 40 ug via INTRAVENOUS

## 2013-02-01 MED ORDER — DIPHENHYDRAMINE HCL 25 MG PO CAPS: 25.0000 mg | ORAL_CAPSULE | ORAL | Status: DC | PRN

## 2013-02-01 MED ORDER — ONDANSETRON HCL 4 MG/2ML IJ SOLN
INTRAMUSCULAR | Status: DC | PRN
  Administered 2013-02-01: 4 mg via INTRAVENOUS

## 2013-02-01 MED ORDER — MORPHINE SULFATE (PF) 0.5 MG/ML IJ SOLN
INTRAMUSCULAR | Status: DC | PRN
  Administered 2013-02-01: .15 mg via INTRATHECAL

## 2013-02-01 MED ORDER — FENTANYL CITRATE 0.05 MG/ML IJ SOLN
INTRAMUSCULAR | Status: AC
  Administered 2013-02-01: 50 ug via INTRAVENOUS
  Filled 2013-02-01: qty 2

## 2013-02-01 MED ORDER — METOCLOPRAMIDE HCL 5 MG/ML IJ SOLN: 10.0000 mg | Freq: Three times a day (TID) | INTRAMUSCULAR | Status: DC | PRN

## 2013-02-01 MED ORDER — LACTATED RINGERS IV SOLN
INTRAVENOUS | Status: DC
  Administered 2013-02-01 (×4): via INTRAVENOUS

## 2013-02-01 MED ORDER — MEPERIDINE HCL 25 MG/ML IJ SOLN: 6.2500 mg | INTRAMUSCULAR | Status: DC | PRN

## 2013-02-01 MED ORDER — OXYTOCIN 40 UNITS IN LACTATED RINGERS INFUSION - SIMPLE MED: 62.5000 mL/h | INTRAVENOUS | Status: AC

## 2013-02-01 MED ORDER — NALBUPHINE HCL 10 MG/ML IJ SOLN
5.0000 mg | INTRAMUSCULAR | Status: DC | PRN
Start: 2013-02-01 — End: 2013-02-03
  Filled 2013-02-01: qty 1

## 2013-02-01 MED ORDER — EPHEDRINE SULFATE 50 MG/ML IJ SOLN
INTRAMUSCULAR | Status: DC | PRN
  Administered 2013-02-01 (×2): 10 mg via INTRAVENOUS
  Administered 2013-02-01 (×2): 15 mg via INTRAVENOUS

## 2013-02-01 MED ORDER — SIMETHICONE 80 MG PO CHEW
80.0000 mg | CHEWABLE_TABLET | Freq: Three times a day (TID) | ORAL | Status: DC
  Administered 2013-02-02 (×3): 80 mg via ORAL

## 2013-02-01 MED ORDER — KETOROLAC TROMETHAMINE 30 MG/ML IJ SOLN: 30.0000 mg | Freq: Four times a day (QID) | INTRAMUSCULAR | Status: AC | PRN

## 2013-02-01 MED ORDER — SENNOSIDES-DOCUSATE SODIUM 8.6-50 MG PO TABS
2.0000 | ORAL_TABLET | Freq: Every day | ORAL | Status: DC
  Administered 2013-02-01 – 2013-02-02 (×2): 2 via ORAL

## 2013-02-01 MED ORDER — DIPHENHYDRAMINE HCL 50 MG/ML IJ SOLN: 25.0000 mg | INTRAMUSCULAR | Status: DC | PRN

## 2013-02-01 MED ORDER — KETOROLAC TROMETHAMINE 30 MG/ML IJ SOLN
30.0000 mg | Freq: Four times a day (QID) | INTRAMUSCULAR | Status: AC | PRN
  Administered 2013-02-01: 30 mg via INTRAVENOUS
  Filled 2013-02-01: qty 1

## 2013-02-01 MED ORDER — KETOROLAC TROMETHAMINE 60 MG/2ML IM SOLN
INTRAMUSCULAR | Status: AC
  Administered 2013-02-01: 60 mg via INTRAMUSCULAR
  Filled 2013-02-01: qty 2

## 2013-02-01 MED ORDER — DIPHENHYDRAMINE HCL 50 MG/ML IJ SOLN: 12.5000 mg | INTRAMUSCULAR | Status: DC | PRN

## 2013-02-01 MED ORDER — SIMETHICONE 80 MG PO CHEW
80.0000 mg | CHEWABLE_TABLET | ORAL | Status: DC | PRN
  Administered 2013-02-01: 80 mg via ORAL

## 2013-02-01 MED ORDER — NALOXONE HCL 1 MG/ML IJ SOLN
1.0000 ug/kg/h | INTRAVENOUS | Status: DC | PRN
  Filled 2013-02-01: qty 2

## 2013-02-01 MED ORDER — LANOLIN HYDROUS EX OINT: 1.0000 "application " | TOPICAL_OINTMENT | CUTANEOUS | Status: DC | PRN

## 2013-02-01 SURGICAL SUPPLY — 32 items
APL SKNCLS STERI-STRIP NONHPOA (GAUZE/BANDAGES/DRESSINGS) ×1
BENZOIN TINCTURE PRP APPL 2/3 (GAUZE/BANDAGES/DRESSINGS) ×1 IMPLANT
BRR ADH 6X5 SEPRAFILM 1 SHT (MISCELLANEOUS)
CLAMP CORD UMBIL (MISCELLANEOUS) IMPLANT
CONTAINER PREFILL 10% NBF 15ML (MISCELLANEOUS) ×2 IMPLANT
DRAPE LG THREE QUARTER DISP (DRAPES) ×2 IMPLANT
DRSG OPSITE POSTOP 4X10 (GAUZE/BANDAGES/DRESSINGS) ×2 IMPLANT
DURAPREP 26ML APPLICATOR (WOUND CARE) ×2 IMPLANT
ELECT REM PT RETURN 9FT ADLT (ELECTROSURGICAL) ×2
ELECTRODE REM PT RTRN 9FT ADLT (ELECTROSURGICAL) ×1 IMPLANT
EXTRACTOR VACUUM M CUP 4 TUBE (SUCTIONS) ×1 IMPLANT
GLOVE BIOGEL PI IND STRL 6.5 (GLOVE) ×1 IMPLANT
GLOVE BIOGEL PI INDICATOR 6.5 (GLOVE) ×1
GLOVE SURG SS PI 6.0 STRL IVOR (GLOVE) ×2 IMPLANT
GOWN STRL REIN XL XLG (GOWN DISPOSABLE) ×4 IMPLANT
KIT ABG SYR 3ML LUER SLIP (SYRINGE) IMPLANT
NDL HYPO 25X5/8 SAFETYGLIDE (NEEDLE) IMPLANT
NEEDLE HYPO 25X5/8 SAFETYGLIDE (NEEDLE) IMPLANT
NS IRRIG 1000ML POUR BTL (IV SOLUTION) ×2 IMPLANT
PACK C SECTION WH (CUSTOM PROCEDURE TRAY) ×2 IMPLANT
PAD ABD 7.5X8 STRL (GAUZE/BANDAGES/DRESSINGS) ×1 IMPLANT
PAD OB MATERNITY 4.3X12.25 (PERSONAL CARE ITEMS) ×2 IMPLANT
RTRCTR C-SECT PINK 25CM LRG (MISCELLANEOUS) ×1 IMPLANT
SEPRAFILM MEMBRANE 5X6 (MISCELLANEOUS) IMPLANT
STAPLER VISISTAT 35W (STAPLE) IMPLANT
STRIP CLOSURE SKIN 1/2X4 (GAUZE/BANDAGES/DRESSINGS) ×2 IMPLANT
SUT PLAIN 0 NONE (SUTURE) ×2 IMPLANT
SUT VIC AB 0 CT1 36 (SUTURE) ×8 IMPLANT
SUT VIC AB 4-0 KS 27 (SUTURE) ×2 IMPLANT
TOWEL OR 17X24 6PK STRL BLUE (TOWEL DISPOSABLE) ×2 IMPLANT
TRAY FOLEY CATH 14FR (SET/KITS/TRAYS/PACK) ×2 IMPLANT
WATER STERILE IRR 1000ML POUR (IV SOLUTION) ×2 IMPLANT

## 2013-02-01 NOTE — Op Note (Addendum)
Tiffany Ayers PROCEDURE DATE: 02/01/2013  PREOPERATIVE DIAGNOSIS: Intrauterine pregnancy at  [redacted]w[redacted]d weeks gestation; previous uterine incision kerr x2  POSTOPERATIVE DIAGNOSIS: The same  PROCEDURE:     Cesarean Section and bilateral tubal ligation using modified Pomeroy method  SURGEON:  Dr. Gigi Gin Fiza Nation  ASSISTANT: none  INDICATIONS: Tiffany Ayers is a 37 y.o. [id number] at [redacted]w[redacted]d scheduled for cesarean section secondary to previous uterine incision kerr x2.  The risks of cesarean section discussed with the patient included but were not limited to: bleeding which may require transfusion or reoperation; infection which may require antibiotics; injury to bowel, bladder, ureters or other surrounding organs; injury to the fetus; need for additional procedures including hysterectomy in the event of a life-threatening hemorrhage; placental abnormalities wth subsequent pregnancies, incisional problems, thromboembolic phenomenon and other postoperative/anesthesia complications. Patient also desires permanent sterilization. Risks and benefits of procedure discussed with patient including permanence of method, bleeding, infection, injury to surrounding organs and need for additional procedures. Risk failure of 0.5-1% with increased risk of ectopic gestation if pregnancy occurs was also discussed with patient.The patient concurred with the proposed plan, giving informed written consent for the procedure.    FINDINGS:  Viable female infant in cephalic presentation.  Apgars 8 and 9.  Clear amniotic fluid.  Intact placenta, three vessel cord.  Normal uterus, fallopian tubes and ovaries bilaterally.  ANESTHESIA:    Spinal INTRAVENOUS FLUIDS:3400 ml ESTIMATED BLOOD LOSS: 900 ml URINE OUTPUT:  300 ml SPECIMENS: Placenta sent to L&D COMPLICATIONS: None immediate  PROCEDURE IN DETAIL:  The patient received intravenous antibiotics and had sequential compression devices applied to her lower extremities while in the  preoperative area.  She was then taken to the operating room where anesthesia was induced and was found to be adequate. A foley catheter was placed into her bladder and attached to Tiffany Ayers gravity. She was then placed in a dorsal supine position with a leftward tilt, and prepped and draped in a sterile manner. After an adequate timeout was performed, a Pfannenstiel skin incision was made with scalpel and carried through to the underlying layer of fascia. The fascia was incised in the midline and this incision was extended bilaterally using the Mayo scissors. Kocher clamps were applied to the superior aspect of the fascial incision and the underlying rectus muscles were dissected off bluntly. A similar process was carried out on the inferior aspect of the facial incision. The rectus muscles were separated in the midline bluntly and the peritoneum was entered bluntly. The Alexis self-retaining retractor was introduced into the abdominal cavity. Attention was turned to the lower uterine segment where a bladder flap was created, and a transverse hysterotomy was made with a scalpel and extended bilaterally bluntly. The infant was successfully delivered, and cord was clamped and cut and infant was handed over to awaiting neonatology team. Uterine massage was then administered and the placenta delivered intact with three-vessel cord. The uterus was cleared of clot and debris.  The hysterotomy was closed with 0 Vicryl in a running locked fashion, and an imbricating layer was also placed with a 0 Vicryl. Overall, excellent hemostasis was noted. The pelvis copiously irrigated and cleared of all clot and debris. Hemostasis was confirmed on all surfaces. The patient's left fallopian tube was then identified, brought to the incision, and grasped with a Babcock clamp. The tube was then followed out to the fimbria. The Babcock clamp was then used to grasp the tube approximately 4 cm from the cornual region. A 3  cm segment of the  tube was then ligated with free tie of plain gut suture, transected and excised. Good hemostasis was noted and the tube was returned to the abdomen. The right fallopian tube was then identified to its fimbriated end, ligated, and a 3 cm segment excised in a similar fashion. Excellent hemostasis was noted, and the tube returned to the abdomen.  The peritoneum and the muscles were reapproximated using 0 vicryl interrupted stitches. The fascia was then closed using 0 Vicryl in a running locked fashion.  The subcutaneous layer was reapproximated with plain gut and the skin was closed in a subcuticular fashion using 3.0 Vicryl. The patient tolerated the procedure well. Sponge, lap, instrument and needle counts were correct x 2. She was taken to the recovery room in stable condition.    Tiffany Ayers,Tiffany Ayers  02/01/2013 10:28 AM

## 2013-02-01 NOTE — MAU Note (Signed)
PT WANTED  TO GO HOME AND  RETURN AT 8PM FOR REPEAT C/S-  BUT SHE WANTS TO TALK TO DR   AND  THEM TELL HER - SHE CAN GO HOME.   CALLED NATALIE, CNM - RECHECK CERVIX- FT,  A RARE [facility].    Marland Kitchen DR MCINTYRE   IN MAU  TO TELL PT   THAT DR DOVE WANTED HER TO STAY.

## 2013-02-01 NOTE — MAU Note (Addendum)
PT SAYS  SHE WAS HURT BAD AT 1030PM.   PNC- AT [facility].  NO VE IN OFFICE .     DENIES HSV AND MRSA.   PT IS REPEAT C/S FOR  930 AM TODAY

## 2013-02-01 NOTE — Anesthesia Procedure Notes (Signed)

## 2013-02-01 NOTE — Anesthesia Preprocedure Evaluation (Signed)
Anesthesia Evaluation  Patient identified by MRN, date of birth, ID band Patient awake    Reviewed: Allergy & Precautions, H&P , Patient's Chart, lab work & pertinent test results  Airway Mallampati: III TM Distance: >3 FB Neck ROM: full    Dental no notable dental hx.    Pulmonary  breath sounds clear to auscultation  Pulmonary exam normal       Cardiovascular Exercise Tolerance: Good Rhythm:regular Rate:Normal     Neuro/Psych    GI/Hepatic   Endo/Other  Morbid obesity  Renal/GU      Musculoskeletal   Abdominal   Peds  Hematology   Anesthesia Other Findings   Reproductive/Obstetrics                           Anesthesia Physical Anesthesia Plan  ASA: III  Anesthesia Plan: Spinal   Post-op Pain Management:    Induction:   Airway Management Planned:   Additional Equipment:   Intra-op Plan:   Post-operative Plan:   Informed Consent: I have reviewed the patients History and Physical, chart, labs and discussed the procedure including the risks, benefits and alternatives for the proposed anesthesia with the patient or authorized representative who has indicated his/her understanding and acceptance.   Dental Advisory Given  Plan Discussed with: CRNA  Anesthesia Plan Comments: (Lab work confirmed with CRNA in room. Platelets okay. Discussed spinal anesthetic, and patient consents to the procedure:  included risk of possible headache,backache, failed block, allergic reaction, and nerve injury. This patient was asked if she had any questions or concerns before the procedure started. )        Anesthesia Quick Evaluation

## 2013-02-01 NOTE — Anesthesia Postprocedure Evaluation (Signed)
  Anesthesia Post-op Note  Patient: Tiffany Ayers  Procedure(s) Performed: Procedure(s): REPEAT CESAREAN SECTION WITH BILATERAL TUBAL LIGATION (Bilateral)  Patient is awake, responsive, moving her legs, and has signs of resolution of her numbness. Pain and nausea are reasonably well controlled. Vital signs are stable and clinically acceptable. Oxygen saturation is clinically acceptable. There are no apparent anesthetic complications at this time. Patient is ready for discharge.

## 2013-02-01 NOTE — H&P (Signed)
Tiffany Ayers is a 37 y.o. female [phone] at 26 weeks presenting for scheduled repeat cesarean section and bilateral tubal ligation. Patient with late onset of prenatal care at 78 weeks at [facility]. 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 Patient currently without any complaints.  History OB History   Grav Para Term Preterm Abortions TAB SAB Ect Mult Living   7 3 3  3  3   3      Past Medical History  Diagnosis Date  . Anti-phospholipid antibody syndrome   . 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 S/P cesarean section 08/21/2011  . Medical history non-contributory    Past Surgical History  Procedure Laterality Date  . Laparoscopic gastric banding    . Cesarean section  2004  . Cesarean section  08/21/2011    Procedure: CESAREAN SECTION;  Surgeon: Sherron Monday, MD;  Location: [facility];  Service: Gynecology;  Laterality: N/A;  . Knee arthroscopy  06/05/1998  . Tonsillectomy    . Tonsillectomy     Family History: family history includes Cleft lip in her sister; Diabetes in her maternal grandmother; Heart disease in her maternal grandfather and maternal grandmother. Social History:  reports that she has never smoked. She has never used smokeless tobacco. She reports that she does not drink alcohol or use illicit drugs.   Prenatal Transfer Tool  Maternal Diabetes: No Genetic Screening: Normal Maternal Ultrasounds/Referrals: Normal Fetal Ultrasounds or other Referrals:  None Maternal Substance Abuse:  No Significant Maternal Medications:  None Significant Maternal Lab Results:  None Other Comments:  None  Review of Systems  All other systems reviewed and are negative.    Dilation: Fingertip Exam by:: NATALIE, CNM Blood pressure 124/74, pulse 63, temperature 98 F (36.7 C), temperature source Oral, resp. rate 20, height 5\' 5"  (1.651 m), weight 241 lb (109.317 kg), last menstrual period  05/04/2012. Exam Physical Exam  GENERAL: Well-developed, well-nourished female in no acute distress.  HEENT: Normocephalic, atraumatic. Sclerae anicteric.  NECK: Supple. Normal thyroid.  LUNGS: Clear to auscultation bilaterally.  HEART: Regular rate and rhythm. ABDOMEN: Soft, nontender, nondistended. No organomegaly. PELVIC: Not indicated EXTREMITIES: No cyanosis, clubbing, or edema, 2+ distal pulses.  Prenatal labs: ABO, Rh: --/--/A POS (08/29 1100) Antibody: NEG (08/29 1100) Rubella: 1.40 (05/01 1026) RPR: NON REACTIVE (08/29 1059)  HBsAg: NEGATIVE (05/01 1026)  HIV: NON REACTIVE (05/01 1026)  GBS: Negative (06/08 0000)   Assessment/Plan: 37 yo [id number] at 39 weeks for scheduled repeat cesarean section and bilateral tubal ligation - Risks, benefits and alternatives were explained including but not limited to risks of bleeding, infection and damage to adjacent organs. Risks of sterilization failure also discussed along with increased risk of ectopic pregnancy. Patient verbalized understanding and all questions were answered.   Kendale Rembold 02/01/2013, 8:54 AM

## 2013-02-01 NOTE — Anesthesia Postprocedure Evaluation (Signed)
  Anesthesia Post-op Note  Patient: Tiffany Ayers  Procedure(s) Performed: Procedure(s): REPEAT CESAREAN SECTION WITH BILATERAL TUBAL LIGATION (Bilateral)  Patient Location: Mother/Baby  Anesthesia Type:Spinal  Level of Consciousness: awake and alert   Airway and Oxygen Therapy: Patient Spontanous Breathing  Post-op Pain: mild  Post-op Assessment: Patient's Cardiovascular Status Stable, Respiratory Function Stable, No signs of Nausea or vomiting, Pain level controlled and No headache  Post-op Vital Signs: Reviewed and stable  Complications: No apparent anesthesia complications

## 2013-02-01 NOTE — Transfer of Care (Signed)
Immediate Anesthesia Transfer of Care Note  Patient: Tiffany Ayers  Procedure(s) Performed: Procedure(s): REPEAT CESAREAN SECTION WITH BILATERAL TUBAL LIGATION (Bilateral)  Patient Location: PACU  Anesthesia Type:Spinal  Level of Consciousness: awake  Airway & Oxygen Therapy: Patient Spontanous Breathing  Post-op Assessment: Report given to PACU RN and Post -op Vital signs reviewed and stable  Post vital signs: stable  Complications: No apparent anesthesia complications

## 2013-02-02 LAB — CBC
HCT: 28 % — ABNORMAL LOW (ref 36.0–46.0)
Hemoglobin: 9.2 g/dL — ABNORMAL LOW (ref 12.0–15.0)
MCH: 28.2 pg (ref 26.0–34.0)
MCHC: 32.9 g/dL (ref 30.0–36.0)
MCV: 85.9 fL (ref 78.0–100.0)
Platelets: 133 10*3/uL — ABNORMAL LOW (ref 150–400)
RBC: 3.26 MIL/uL — ABNORMAL LOW (ref 3.87–5.11)
RDW: 14.7 % (ref 11.5–15.5)
WBC: 6.1 10*3/uL (ref 4.0–10.5)

## 2013-02-02 NOTE — Progress Notes (Signed)
Subjective: Postpartum Day 1: Cesarean Delivery Patient reports incisional pain, tolerating PO, + flatus, + BM and no problems voiding.    Objective: Vital signs in last 24 hours: Temp:  [97.7 F (36.5 C)-98.9 F (37.2 C)] 97.9 F (36.6 C) (08/31 0350) Pulse Rate:  [59-84] 60 (08/31 0350) Resp:  [16-22] 20 (08/31 0350) BP: (98-120)/(54-97) 98/58 mmHg (08/31 0350) SpO2:  [97 %-100 %] 97 % (08/31 0350)  Physical Exam:  General: alert, cooperative, appears stated age and no distress Lochia: appropriate Uterine Fundus: firm Incision: no significant drainage, no significant erythema, minimal blood at midline of dressing, dried not saturdated DVT Evaluation: No evidence of DVT seen on physical exam. Negative Homan's sign. No cords or calf tenderness. No significant calf/ankle edema.   Recent Labs  01/31/13 1100 02/02/13 0632  HGB 11.6* 9.2*  HCT 34.9* 28.0*    Assessment/Plan: Status post Cesarean section. Doing well postoperatively.  Continue current care Bottle feeding S/p BTL Expect discharge tomorrow.  Tawana Scale 02/02/2013, 9:35 AM

## 2013-02-03 ENCOUNTER — Encounter (HOSPITAL_COMMUNITY): Payer: Self-pay | Admitting: Obstetrics and Gynecology

## 2013-02-03 MED ORDER — OXYCODONE-ACETAMINOPHEN 5-325 MG PO TABS: 1.0000 | ORAL_TABLET | ORAL | Status: DC | PRN

## 2013-02-03 MED ORDER — IBUPROFEN 600 MG PO TABS: 600.0000 mg | ORAL_TABLET | Freq: Four times a day (QID) | ORAL | Status: DC

## 2013-02-03 NOTE — Discharge Summary (Signed)
Obstetric Discharge Summary Reason for Admission: cesarean section Prenatal Procedures: none Intrapartum Procedures: cesarean: low cervical, transverse Postpartum Procedures: none Complications-Operative and Postpartum: none Hemoglobin  Date Value Range Status  02/02/2013 9.2* 12.0 - 15.0 g/dL Final     DELTA CHECK NOTED     REPEATED TO VERIFY     HCT  Date Value Range Status  02/02/2013 28.0* 36.0 - 46.0 % Final  Hospital Course: Tiffany Ayers is a 37 y.o. female [phone] at 37 weeks presenting for scheduled repeat cesarean section and bilateral tubal ligation. Patient with late onset of prenatal care at 31 weeks at [facility]. 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 Patient currently without any complaints  PROCEDURE DATE: 02/01/2013  PREOPERATIVE DIAGNOSIS: Intrauterine pregnancy at [redacted]w[redacted]d weeks gestation; previous uterine incision kerr x2  POSTOPERATIVE DIAGNOSIS: The same  PROCEDURE: Cesarean Section and bilateral tubal ligation using modified Pomeroy method  SURGEON: Dr. Gigi Gin Constant  ASSISTANT: none  INDICATIONS: Tiffany Ayers is a 37 y.o. [id number] at [redacted]w[redacted]d scheduled for cesarean section secondary to previous uterine incision kerr x2. The risks of cesarean section discussed with the patient included but were not limited to: bleeding which may require transfusion or reoperation; infection which may require antibiotics; injury to bowel, bladder, ureters or other surrounding organs; injury to the fetus; need for additional procedures including hysterectomy in the event of a life-threatening hemorrhage; placental abnormalities wth subsequent pregnancies, incisional problems, thromboembolic phenomenon and other postoperative/anesthesia complications. Patient also desires permanent sterilization. Risks and benefits of procedure discussed with patient including permanence of method, bleeding, infection, injury to  surrounding organs and need for additional procedures. Risk failure of 0.5-1% with increased risk of ectopic gestation if pregnancy occurs was also discussed with patient.The patient concurred with the proposed plan, giving informed written consent for the procedure.  FINDINGS: Viable female infant in cephalic presentation. Apgars 8 and 9. Clear amniotic fluid. Intact placenta, three vessel cord. Normal uterus, fallopian tubes and ovaries bilaterally.  ANESTHESIA: Spinal  INTRAVENOUS FLUIDS:3400 ml  ESTIMATED BLOOD LOSS: 900 ml  She has done well post operatively. She is voiding and passing flatus. No problems ambulating. Incision healing well. Wants to go home today.    Physical Exam:  General: alert and no distress Lochia: appropriate Uterine Fundus: firm Incision: healing well, no significant drainage DVT Evaluation: No evidence of DVT seen on physical exam.  Discharge Diagnoses: Term Pregnancy-delivered  Discharge Information: Date: 02/03/2013 Activity: pelvic rest  No driving for 2 weeks Diet: routine Medications: PNV, Ibuprofen and Percocet Condition: stable Instructions: refer to practice specific booklet Discharge to: home   Has appt at [facility] already for [facility] visit   Newborn Data: Live born female  Birth Weight: 7 lb 1.8 oz (3225 g) APGAR: 8, 9  Home with mother.  [facility] 02/03/2013, 9:14 AM

## 2013-02-03 NOTE — Progress Notes (Signed)
UR chart review completed.  

## 2013-02-03 NOTE — Discharge Summary (Signed)
Attestation of Attending Supervision of Advanced Practitioner (CNM/NP): Evaluation and management procedures were performed by the Advanced Practitioner under my supervision and collaboration.  I have reviewed the Advanced Practitioner's note and chart, and I agree with the management and plan.  Giovani Neumeister 02/03/2013 12:01 PM   

## 2013-02-10 ENCOUNTER — Ambulatory Visit (HOSPITAL_COMMUNITY): Admission: RE | Admit: 2013-02-10 | Payer: Medicaid Other | Source: Ambulatory Visit

## 2013-03-06 ENCOUNTER — Ambulatory Visit (INDEPENDENT_AMBULATORY_CARE_PROVIDER_SITE_OTHER): Payer: Self-pay | Admitting: Physician Assistant

## 2013-03-06 ENCOUNTER — Encounter (INDEPENDENT_AMBULATORY_CARE_PROVIDER_SITE_OTHER): Payer: Self-pay

## 2013-03-06 VITALS — BP 118/80 | HR 60 | Temp 97.8°F | Resp 16 | Ht 65.0 in | Wt 223.2 lb

## 2013-03-06 DIAGNOSIS — Z4651 Encounter for fitting and adjustment of gastric lap band: Secondary | ICD-10-CM

## 2013-03-06 NOTE — Patient Instructions (Signed)

## 2013-03-06 NOTE — Progress Notes (Signed)
  HISTORY: Tiffany Ayers is a 37 y.o.female who received an AP-Standard lap-band in August 2011 by Dr. Daphine Deutscher. She comes in after having delivered her child and is ready for getting back on track with weight loss. She's gained about 30 lbs since pregnancy. She definitely feels a lack of restriction as Dr. Ezzard Standing removed 5 mL from the band at her last visit with [place].  VITAL SIGNS: Filed Vitals:   03/06/13 1205  BP: 118/80  Pulse: 60  Temp: 97.8 F (36.6 C)  Resp: 16    PHYSICAL EXAM: Physical exam reveals a very well-appearing 37 y.o.female in no apparent distress Neurologic: Awake, alert, oriented Psych: Bright affect, conversant Respiratory: Breathing even and unlabored. No stridor or wheezing Abdomen: Soft, nontender, nondistended to palpation. Incisions well-healed. No incisional hernias. Port easily palpated. Extremities: Atraumatic, good range of motion.  ASSESMENT: 37 y.o.  female  s/p AP-Standard lap-band.   PLAN: The patient's port was accessed with a 20G Huber needle without difficulty. Clear fluid was aspirated and 3.5 mL saline was added to the port to give a total predicted volume of 4.5 mL. The patient was able to swallow water without difficulty following the procedure and was instructed to take clear liquids for the next 24-48 hours and advance slowly as tolerated. Air was removed from the band as much as possible.

## 2013-03-11 ENCOUNTER — Ambulatory Visit (INDEPENDENT_AMBULATORY_CARE_PROVIDER_SITE_OTHER): Payer: Medicaid Other | Admitting: Obstetrics & Gynecology

## 2013-03-11 ENCOUNTER — Encounter: Payer: Self-pay | Admitting: Obstetrics & Gynecology

## 2013-03-11 VITALS — BP 118/71 | HR 59 | Ht 65.0 in | Wt 221.2 lb

## 2013-03-11 DIAGNOSIS — Z09 Encounter for follow-up examination after completed treatment for conditions other than malignant neoplasm: Secondary | ICD-10-CM

## 2013-03-11 NOTE — Patient Instructions (Signed)

## 2013-03-11 NOTE — Progress Notes (Signed)
  Subjective:    Patient ID: Tiffany Ayers, female    DOB: [birth year], 37 y.o.   MRN: [id number]  HPI  37 yo lady here 4 weeks po s/p RLTCS and PPS. She reports that the baby is growing well with bottle feeding. Her breasts have dried up without problem. She reports normal bowel and bladder function. She denies depression symptoms and scored 1 on her pp test. She reports that the right corner of her scar became sore this morning. She declines a flu vaccination today.    Review of Systems  Her pap was normal 5/14    Objective:   Physical Exam  Normal healing incision. No areas concerning for infection.      Assessment & Plan:  Pp/po - doing well RTC 1 year for a pap smear or prn sooner

## 2013-03-20 ENCOUNTER — Encounter (INDEPENDENT_AMBULATORY_CARE_PROVIDER_SITE_OTHER): Payer: Self-pay

## 2013-03-20 ENCOUNTER — Ambulatory Visit (INDEPENDENT_AMBULATORY_CARE_PROVIDER_SITE_OTHER): Payer: Self-pay | Admitting: Physician Assistant

## 2013-03-20 VITALS — BP 110/70 | HR 68 | Resp 14 | Ht 65.0 in | Wt 220.0 lb

## 2013-03-20 DIAGNOSIS — Z4651 Encounter for fitting and adjustment of gastric lap band: Secondary | ICD-10-CM

## 2013-03-20 NOTE — Progress Notes (Signed)
  HISTORY: Tiffany Ayers is a 37 y.o.female who received an AP-Standard lap-band in August 2011 by Dr. Daphine Deutscher. She comes in with 3 lbs weight loss since her last fill 2 weeks ago. She is in the midst of having fluid replaced after her pregnancy. She denies regurgitation or reflux but she does have persistent hunger. She would like a fill today.  VITAL SIGNS: Filed Vitals:   03/20/13 1053  BP: 110/70  Pulse: 68  Resp: 14    PHYSICAL EXAM: Physical exam reveals a very well-appearing 37 y.o.female in no apparent distress Neurologic: Awake, alert, oriented Psych: Bright affect, conversant Respiratory: Breathing even and unlabored. No stridor or wheezing Abdomen: Soft, nontender, nondistended to palpation. Incisions well-healed. No incisional hernias. Port easily palpated. Extremities: Atraumatic, good range of motion.  ASSESMENT: 37 y.o.  female  s/p AP-Standard lap-band.   PLAN: The patient's port was accessed with a 20G Huber needle without difficulty. Clear fluid was aspirated and 1 mL saline was added to the port to give a total predicted volume of 5.5 mL. The patient was able to swallow water without difficulty following the procedure and was instructed to take clear liquids for the next 24-48 hours and advance slowly as tolerated.

## 2013-03-20 NOTE — Patient Instructions (Signed)

## 2013-03-24 ENCOUNTER — Encounter (INDEPENDENT_AMBULATORY_CARE_PROVIDER_SITE_OTHER): Payer: Self-pay | Admitting: Surgery

## 2013-03-26 ENCOUNTER — Ambulatory Visit (INDEPENDENT_AMBULATORY_CARE_PROVIDER_SITE_OTHER): Payer: Self-pay | Admitting: Surgery

## 2013-03-26 ENCOUNTER — Encounter (INDEPENDENT_AMBULATORY_CARE_PROVIDER_SITE_OTHER): Payer: Self-pay | Admitting: Surgery

## 2013-03-26 VITALS — BP 120/70 | HR 64 | Temp 98.8°F | Resp 14 | Ht 65.0 in | Wt 216.0 lb

## 2013-03-26 DIAGNOSIS — Z9884 Bariatric surgery status: Secondary | ICD-10-CM

## 2013-03-26 NOTE — Progress Notes (Signed)
Lapband Fill Encounter Problem List:   Patient Active Problem List   Diagnosis Date Noted  . Desires Sterilization 10/03/2012  . Advanced maternal age, antepartum 10/03/2012  . Late prenatal care 10/03/2012  . 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 Previous cesarean section x 2 complicating pregnancy, antepartum 08/21/2011    Ronne Binning Body mass index is 35.94 kg/(m^2). Weight loss since surgery  49  Having regurgitation?:  yes  Feel that they need a fill?  unfill  Nocturnal reflux?  yes  Amount of fill  -0.5     Instructions given and weight loss goals discussed.    Was overfilled after band adjustment.  Has already paid for adjustment.  Fluid removed.  Able to drink.  Will see back in 2 months.   Matt B. Daphine Deutscher, MD, FACS

## 2013-03-26 NOTE — Patient Instructions (Signed)

## 2013-04-24 ENCOUNTER — Encounter (INDEPENDENT_AMBULATORY_CARE_PROVIDER_SITE_OTHER): Payer: Self-pay

## 2013-05-22 ENCOUNTER — Encounter (INDEPENDENT_AMBULATORY_CARE_PROVIDER_SITE_OTHER): Payer: Self-pay

## 2014-04-06 ENCOUNTER — Encounter (INDEPENDENT_AMBULATORY_CARE_PROVIDER_SITE_OTHER): Payer: Self-pay | Admitting: Surgery

## 2014-05-17 ENCOUNTER — Encounter (HOSPITAL_COMMUNITY): Payer: Self-pay | Admitting: Family Medicine

## 2014-05-17 ENCOUNTER — Emergency Department (HOSPITAL_COMMUNITY)
Admission: EM | Admit: 2014-05-17 | Discharge: 2014-05-17 | Disposition: A | Discharge status: Home / Self Care | Payer: Medicaid Other | Attending: Emergency Medicine | Admitting: Emergency Medicine

## 2014-05-17 DIAGNOSIS — R109 Unspecified abdominal pain: Secondary | ICD-10-CM | POA: Insufficient documentation

## 2014-05-17 DIAGNOSIS — R110 Nausea: Secondary | ICD-10-CM | POA: Insufficient documentation

## 2014-05-17 DIAGNOSIS — Z3202 Encounter for pregnancy test, result negative: Secondary | ICD-10-CM | POA: Insufficient documentation

## 2014-05-17 LAB — COMPREHENSIVE METABOLIC PANEL
ALT: 7 U/L (ref 0–35)
AST: 11 U/L (ref 0–37)
Albumin: 4 g/dL (ref 3.5–5.2)
Alkaline Phosphatase: 63 U/L (ref 39–117)
Anion gap: 12 (ref 5–15)
BUN: 10 mg/dL (ref 6–23)
CO2: 27 mEq/L (ref 19–32)
Calcium: 8.8 mg/dL (ref 8.4–10.5)
Chloride: 101 mEq/L (ref 96–112)
Creatinine, Ser: 0.61 mg/dL (ref 0.50–1.10)
GFR calc Af Amer: >90 mL/min (ref >90)
GFR calc non Af Amer: >90 mL/min (ref >90)
Glucose, Bld: 88 mg/dL (ref 70–99)
Potassium: 3.5 mEq/L — ABNORMAL LOW (ref 3.7–5.3)
Sodium: 140 mEq/L (ref 137–147)
Total Bilirubin: 0.7 mg/dL (ref 0.3–1.2)
Total Protein: 7.3 g/dL (ref 6.0–8.3)

## 2014-05-17 LAB — CBC WITH DIFFERENTIAL/PLATELET
Basophils Absolute: 0 10*3/uL (ref 0.0–0.1)
Basophils Relative: 0 % (ref 0–1)
Eosinophils Absolute: 0 10*3/uL (ref 0.0–0.7)
Eosinophils Relative: 0 % (ref 0–5)
HCT: 41.1 % (ref 36.0–46.0)
Hemoglobin: 13.6 g/dL (ref 12.0–15.0)
Lymphocytes Relative: 32 % (ref 12–46)
Lymphs Abs: 1.6 10*3/uL (ref 0.7–4.0)
MCH: 28.8 pg (ref 26.0–34.0)
MCHC: 33.1 g/dL (ref 30.0–36.0)
MCV: 87.1 fL (ref 78.0–100.0)
Monocytes Absolute: 0.3 10*3/uL (ref 0.1–1.0)
Monocytes Relative: 7 % (ref 3–12)
Neutro Abs: 2.9 10*3/uL (ref 1.7–7.7)
Neutrophils Relative %: 61 % (ref 43–77)
Platelets: 204 10*3/uL (ref 150–400)
RBC: 4.72 MIL/uL (ref 3.87–5.11)
RDW: 12.7 % (ref 11.5–15.5)
WBC: 4.9 10*3/uL (ref 4.0–10.5)

## 2014-05-17 LAB — URINALYSIS, ROUTINE W REFLEX MICROSCOPIC
Glucose, UA: NEGATIVE mg/dL
Hgb urine dipstick: NEGATIVE
Ketones, ur: 40 mg/dL — AB
Nitrite: NEGATIVE
Protein, ur: NEGATIVE mg/dL
Specific Gravity, Urine: 1.026 (ref 1.005–1.030)
Urobilinogen, UA: 0.2 mg/dL (ref 0.0–1.0)
pH: 5.5 (ref 5.0–8.0)

## 2014-05-17 LAB — POC URINE PREG, ED: Preg Test, Ur: NEGATIVE

## 2014-05-17 LAB — LIPASE, BLOOD: Lipase: 32 U/L (ref 11–59)

## 2014-05-17 LAB — URINE MICROSCOPIC-ADD ON

## 2014-05-17 LAB — TROPONIN I: Troponin I: <0.3 ng/mL (ref <0.30)

## 2014-05-17 MED ORDER — ONDANSETRON HCL 4 MG/2ML IJ SOLN: 4.0000 mg | Freq: Once | INTRAMUSCULAR | Status: DC

## 2014-05-17 MED ORDER — SODIUM CHLORIDE 0.9 % IV BOLUS (SEPSIS)
1000.0000 mL | Freq: Once | INTRAVENOUS | Status: AC
  Administered 2014-05-17: 1000 mL via INTRAVENOUS

## 2014-05-17 MED ORDER — GI COCKTAIL ~~LOC~~
30.0000 mL | Freq: Once | ORAL | Status: AC
  Administered 2014-05-17: 30 mL via ORAL
  Filled 2014-05-17: qty 30

## 2014-05-17 MED ORDER — ONDANSETRON 4 MG PO TBDP: 4.0000 mg | ORAL_TABLET | Freq: Three times a day (TID) | ORAL | Status: DC | PRN

## 2014-05-17 NOTE — ED Notes (Signed)
Per pt sts that she has been having abd pain x 1 week. sts she has lap ban and they removed fluid Friday. sts she cannot eat or drink without pain. sts some nausea.

## 2014-05-17 NOTE — ED Provider Notes (Signed)
CSN: [id number]     Arrival date & time 05/17/14  [id number] History   First MD Initiated Contact with Patient 05/17/14 1102     Chief Complaint  Patient presents with  . Abdominal Pain     (Consider location/radiation/quality/duration/timing/severity/associated sxs/prior Treatment) The history is provided by the patient and medical records.    This is a 38 year old female with past medical history significant for antiphospholipid syndrome, presenting to the ED for epigastric abdominal pain for the past week. Patient currently has a lap band in place, states on Friday she went and saw her surgeon who removed all of her fluid with concern that this was the cause of her symptoms. States she has had continued epigastric pain with nausea but denies vomiting. States she feels hungry, but cannot eat or drink due to nausea and pain with eating.  States she has no difficulty eating or swallowing, but once food settles into her stomach it is very uncomfortable and burning. She denies any fever or chills. No known sick contacts.  States no issues with lap band in the past, does not feel that her pain today is related to lap band.  Only other abdominal surgery is cesarean section x3.  Denies recent travel or changes in diet.  Patient has been taking leftover oxycontin from prior surgery for pain control at home with noted relief.  VS stable on arrival.  Past Medical History  Diagnosis Date  . Anti-phospholipid antibody syndrome   . 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 S/P cesarean section 08/21/2011  . Medical history non-contributory    Past Surgical History  Procedure Laterality Date  . Laparoscopic gastric banding    . Cesarean section  2004  . Cesarean section  08/21/2011    Procedure: CESAREAN SECTION;  Surgeon: Sherron MondayJody Bovard, MD;  Location: [facility];  Service: Gynecology;  Laterality: N/A;  . Knee arthroscopy  06/05/1998  . Tonsillectomy    . Tonsillectomy    . Cesarean section with bilateral tubal  ligation Bilateral 02/01/2013    Procedure: REPEAT CESAREAN SECTION WITH BILATERAL TUBAL LIGATION;  Surgeon: Catalina AntiguaPeggy Constant, MD;  Location: [facility];  Service: Obstetrics;  Laterality: Bilateral;   Family History  Problem Relation Age of Onset  . Diabetes Maternal Grandmother   . Cleft lip Sister   . Heart disease Maternal Grandmother   . Heart disease Maternal Grandfather    History  Substance Use Topics  . Smoking status: Never Smoker   . Smokeless tobacco: Never Used  . Alcohol Use: No     Comment: occasional   OB History    Gravida Para Term Preterm AB TAB SAB Ectopic Multiple Living   7 4 4  3  3   4      Review of Systems  Gastrointestinal: Positive for nausea and abdominal pain.  All other systems reviewed and are negative.     Allergies  Vicodin  Home Medications   Prior to Admission medications   Not on File   BP 109/54 mmHg  Pulse 67  Temp(Src) 98.4 F (36.9 C) (Oral)  Resp 18  SpO2 98%  LMP 05/04/2014   Physical Exam  Constitutional: She is oriented to person, place, and time. She appears well-developed and well-nourished. No distress.  HENT:  Head: Normocephalic and atraumatic.  Mouth/Throat: Oropharynx is clear and moist.  Eyes: Conjunctivae and EOM are normal. Pupils are equal, round, and reactive to light.  Neck: Normal range of motion. Neck supple.  Cardiovascular: Normal rate, regular rhythm  and normal heart sounds.   Pulmonary/Chest: Effort normal and breath sounds normal. No respiratory distress. She has no wheezes.  Abdominal: Soft. Bowel sounds are normal. There is no tenderness. There is no guarding and no CVA tenderness.  Abdomen soft, non-distended, endorses pain of her epigastrium but no focal tenderness or peritoneal signs  Musculoskeletal: Normal range of motion.  Neurological: She is alert and oriented to person, place, and time.  Skin: Skin is warm and dry. She is not diaphoretic.  Psychiatric: She has a normal mood and affect.   Nursing note and vitals reviewed.   ED Course  Procedures (including critical care time) Labs Review Labs Reviewed  COMPREHENSIVE METABOLIC PANEL - Abnormal; Notable for the following:    Potassium 3.5 (*)    All other components within normal limits  URINALYSIS, ROUTINE W REFLEX MICROSCOPIC - Abnormal; Notable for the following:    Color, Urine AMBER (*)    APPearance TURBID (*)    Bilirubin Urine SMALL (*)    Ketones, ur 40 (*)    Leukocytes, UA TRACE (*)    All other components within normal limits  URINE MICROSCOPIC-ADD ON - Abnormal; Notable for the following:    Squamous Epithelial / LPF MANY (*)    All other components within normal limits  CBC WITH DIFFERENTIAL  LIPASE, BLOOD  TROPONIN I  POC URINE PREG, ED    Imaging Review No results found.   EKG Interpretation None      MDM   Final diagnoses:  Abdominal pain, unspecified abdominal location   38 year old female with epigastric abdominal pain. She recently had all of the fluid drained out of her lap band due to pain, however no improvement. Pain worsened with eating.  On review of medical records, patient seems to have been having issues with her lap band over the past 2 months-- has been seen several times to have additional fluid added and then removed a few days later.  On exam, patient afebrile and in no acute distress. She endorses pain of her epigastrium, but exam is overall benign. Will obtain basic labs.  Patient given IV fluids, zofran.   Labwork is reassuring. UA appears contaminated. After fluids and Zofran, patient states nausea has somewhat improved. She was additionally given a GI cocktail which she states improved her pain. She has been able to tolerate sandwich, crackers, and PO fluids in the ED without difficulty. Patient states she is feeling better and would like to go home.  Abdominal exam remains benign.  Symptoms possibly due to gastritis, however doubt acute/surgical pathology.  Feel patient  is appropriate for discharge.  Rx zofran, encouraged to continue OTC antacids as needed.  Encouraged to FU closely with her surgeon regarding her lap band.  Discussed plan with patient, he/she acknowledged understanding and agreed with plan of care.  Return precautions given for new or worsening symptoms.  Garlon HatchetLisa M Twinkle Sockwell, PA-C 05/17/14 1514  Raeford RazorStephen Kohut, MD 05/18/14 [phone]

## 2014-05-17 NOTE — Discharge Instructions (Signed)
Your work-up today was normal.   May take zofran as needed for nausea.  May continue your home oxycontin. You may continue over the counter anatacids (pepcid, prilosec, zantac, tums, maalox) as needed for gastritis type symptoms. Follow-up with Dr. Daphine DeutscherMartin. Return to the ED for new concerns.

## 2014-05-19 ENCOUNTER — Telehealth (INDEPENDENT_AMBULATORY_CARE_PROVIDER_SITE_OTHER): Payer: Self-pay

## 2014-05-19 NOTE — Telephone Encounter (Signed)
Pt called to request pain medication for her stomach.  She states she had fluid removed from lap band on 12/11.  She continued to have pain in her stomach so bad that she went to the ER on 12/13.  They gave her a GI cocktail for the inflammation.  Is there a different medication MD recommends for stomach pain?  She states she cannot eat or drink without pain.  Please advise.  Pharmacy is [facility].

## 2014-08-31 ENCOUNTER — Encounter: Payer: Self-pay | Admitting: *Deleted

## 2014-12-03 ENCOUNTER — Other Ambulatory Visit (INDEPENDENT_AMBULATORY_CARE_PROVIDER_SITE_OTHER): Payer: Self-pay | Admitting: *Deleted

## 2014-12-03 DIAGNOSIS — R319 Hematuria, unspecified: Secondary | ICD-10-CM

## 2014-12-04 MED ORDER — SULFAMETHOXAZOLE-TRIMETHOPRIM 800-160 MG PO TABS: 1.0000 | ORAL_TABLET | Freq: Two times a day (BID) | ORAL | Status: DC

## 2014-12-04 NOTE — Addendum Note (Signed)
Addended by: Arne ClevelandHUTCHINSON, MANDY J on: 12/04/2014 09:11 AM   Modules accepted: Orders

## 2014-12-04 NOTE — Addendum Note (Signed)
Addended by: Arne ClevelandHUTCHINSON, MANDY J on: 12/04/2014 08:33 AM   Modules accepted: Orders

## 2014-12-07 LAB — CULTURE, URINE COMPREHENSIVE: Colony Count: >=100000

## 2014-12-08 ENCOUNTER — Telehealth: Payer: Self-pay | Admitting: *Deleted

## 2014-12-08 DIAGNOSIS — N390 Urinary tract infection, site not specified: Secondary | ICD-10-CM

## 2014-12-08 MED ORDER — CIPROFLOXACIN HCL 500 MG PO TABS
500.0000 mg | ORAL_TABLET | Freq: Two times a day (BID) | ORAL | Status: DC
Start: 2014-12-08 — End: 2015-09-10

## 2014-12-08 NOTE — Telephone Encounter (Signed)
-----   Message from Reva Boresanya S Pratt, MD sent at 12/08/2014  8:04 AM EDT ----- Needs cipro 500 mg bid x 7 d # 14

## 2014-12-08 NOTE — Telephone Encounter (Signed)
LM on voicemail that she needs RX for UTI.  [facility] [address] Per Dr Shawnie PonsPratt.

## 2014-12-18 IMAGING — US US OB FOLLOW-UP
1 series · 12 of 28 positions shown · non-contrast
Comparison: none

[Series 1: us ob follow up · 12 of 38 slices shown]
[im 2/38]
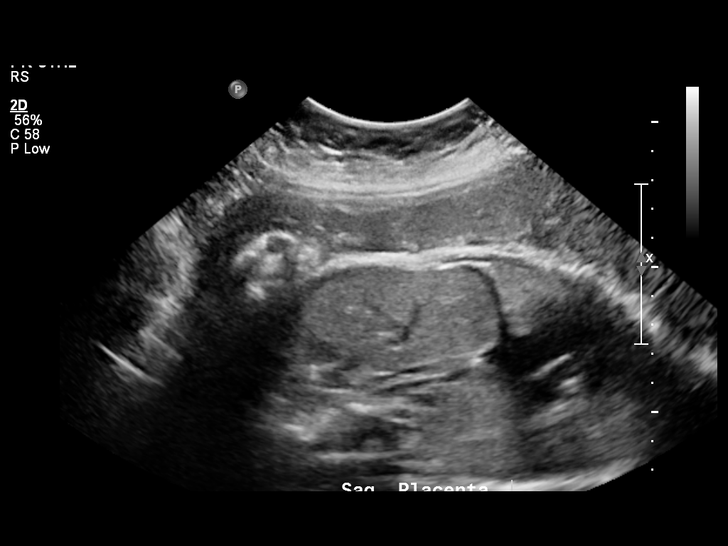
[im 5/38]
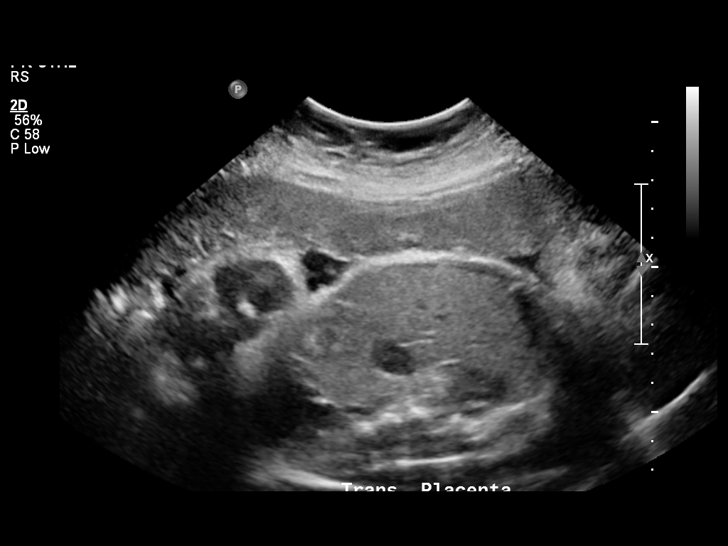
[im 7/38]
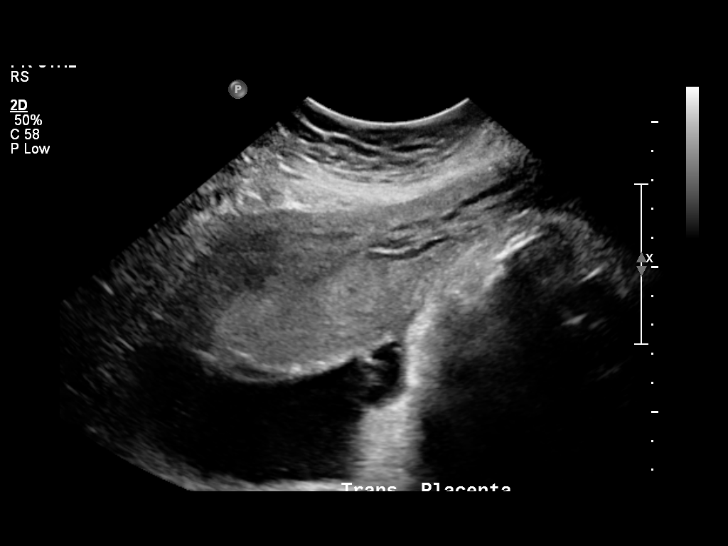
[im 11/38]
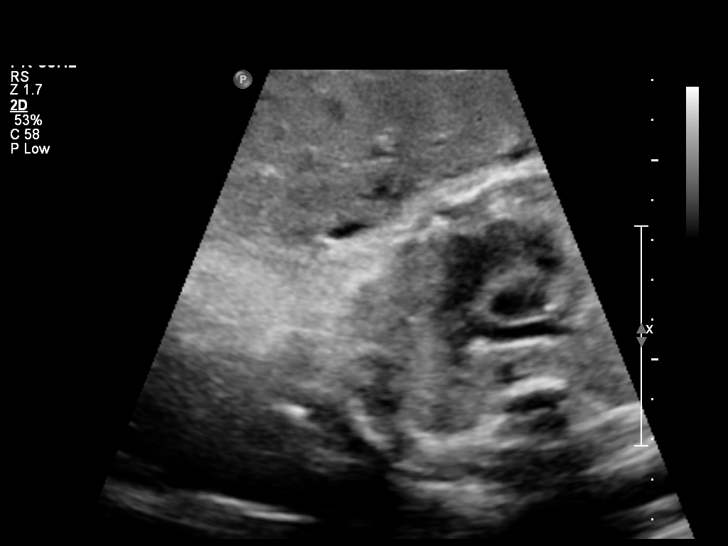
[im 14/38]
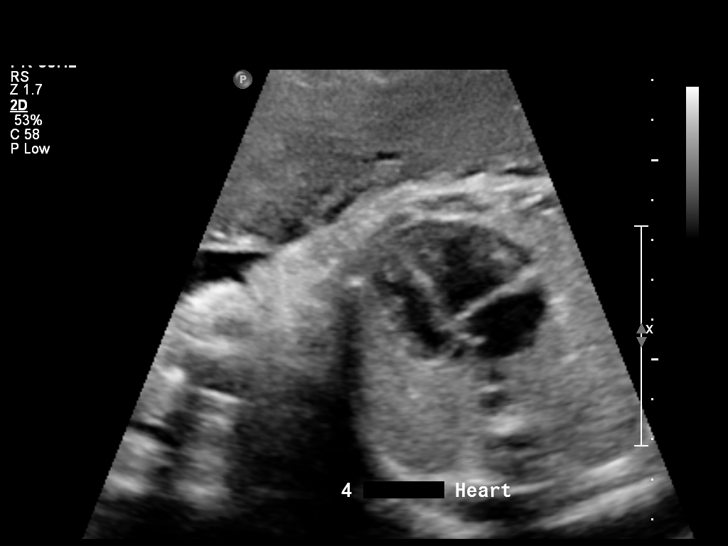
[im 17/38]
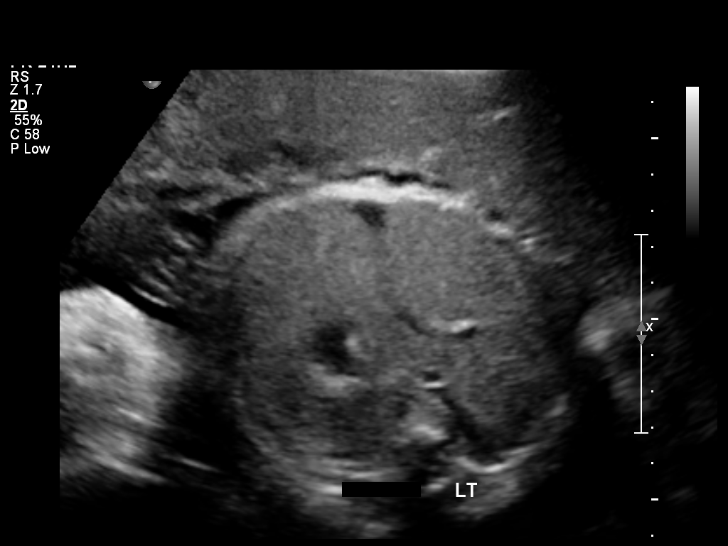
[im 21/38]
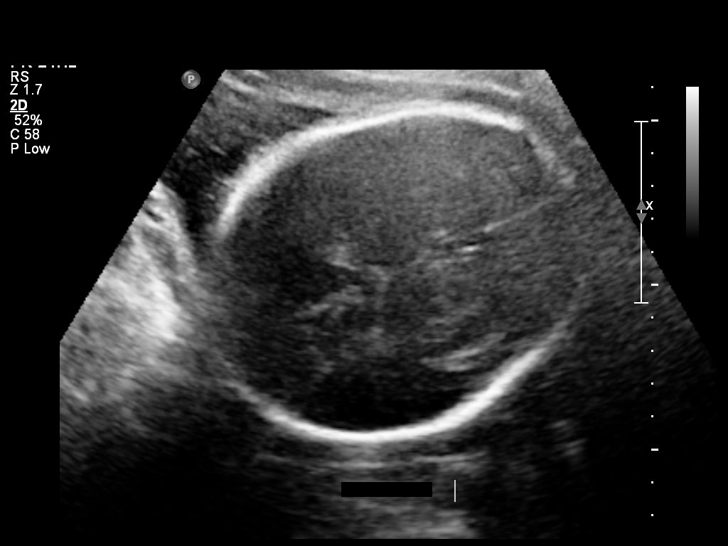
[im 24/38]
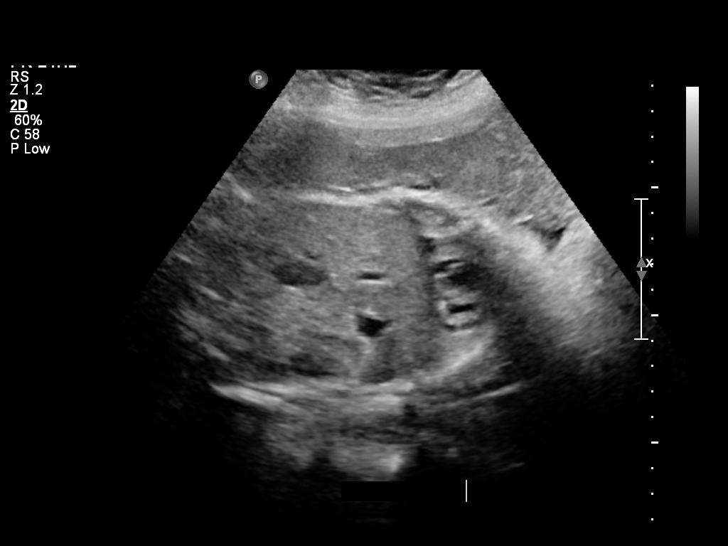
[im 27/38]
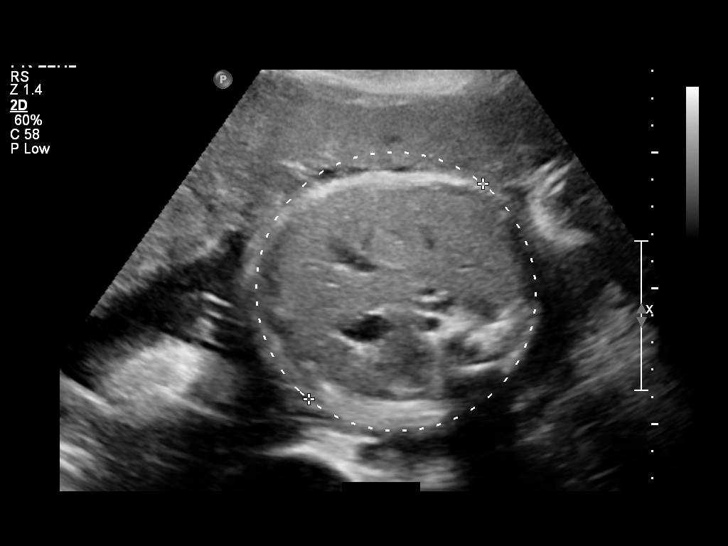
[im 31/38]
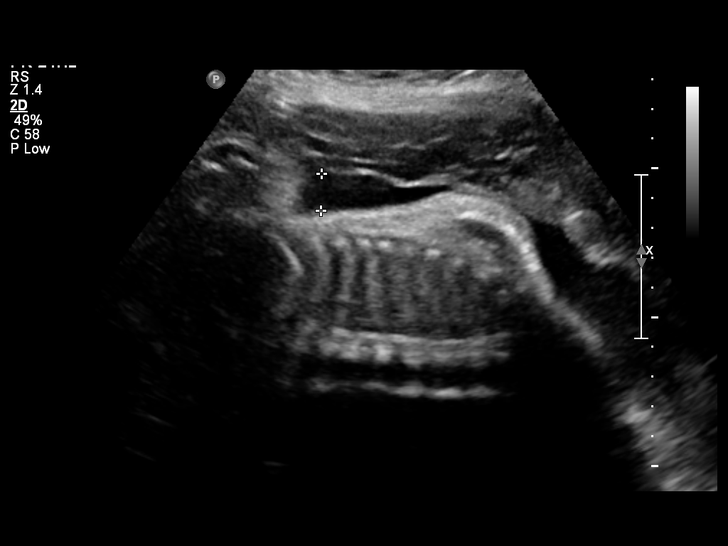
[im 33/38]
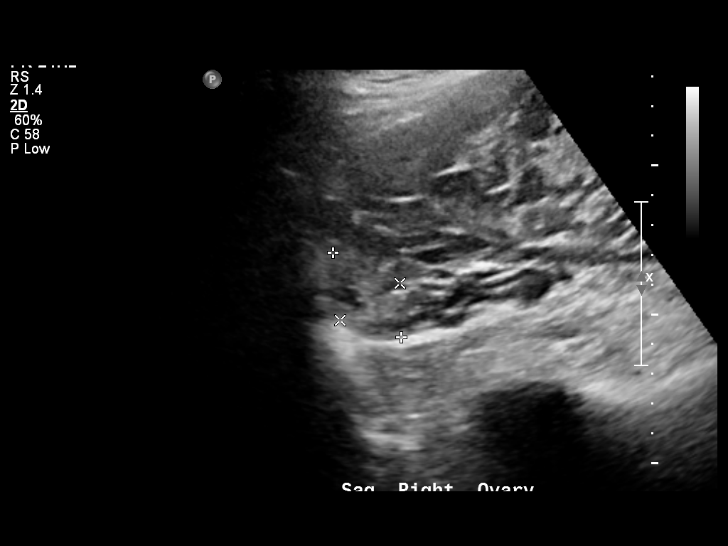
[im 36/38]
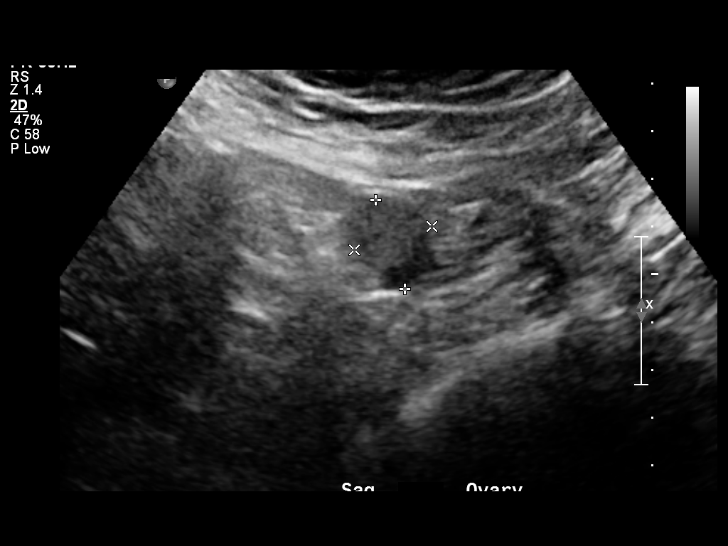

[12 of 28 positions shown; findings below may reference images not displayed]

OBSTETRICS REPORT
                      (Signed Final 01/17/2013 [DATE])

Service(s) Provided

 US OB FOLLOW UP                                       76816.1
Indications

 Size greater than dates (Large for gestational [AGE]
Fetal Evaluation

 Num Of Fetuses:    1
 Fetal Heart Rate:  134                         bpm
 Cardiac Activity:  Observed
 Presentation:      Cephalic
 Placenta:          Anterior, above cervical os
 P. Cord            Previously Visualized
 Insertion:

 Amniotic Fluid
 AFI FV:      Subjectively within normal limits
 AFI Sum:     15.03   cm      56   %Tile     Larg Pckt:   6.12   cm
 RUQ:   6.12   cm    RLQ:    4.93   cm    LUQ:   1.24    cm   LLQ:    2.74   cm
Biometry

 BPD:     93.2  mm    G. Age:   37w 6d                CI:        73.46   70 - 86
                                                      FL/HC:      20.4   20.8 -

 HC:     345.5  mm    G. Age:   40w 0d       90  %    HC/AC:      1.07   0.92 -

 AC:       322  mm    G. Age:   36w 1d       42  %    FL/BPD:     75.6   71 - 87
 FL:      70.5  mm    G. Age:   36w 1d       31  %    FL/AC:      21.9   20 - 24

 Est. FW:    3389  gm    6 lb 10 oz      65  %
Gestational Age

 LMP:           36w 6d       Date:   05/04/12                 EDD:   02/08/13
 U/S Today:     37w 4d                                        EDD:   02/03/13
 Best:          36w 6d    Det. By:   LMP  (05/04/12)          EDD:   02/08/13
Anatomy

 Cranium:          Appears normal         Aortic Arch:      Previously seen
 Fetal Cavum:      Appears normal         Ductal Arch:      Previously seen
 Ventricles:       Appears normal         Diaphragm:        Appears normal
 Choroid Plexus:   Previously seen        Stomach:          Appears normal, left
                                                            sided
 Cerebellum:       Previously seen        Abdomen:          Previously seen
 Posterior Fossa:  Previously seen        Abdominal Wall:   Previously seen
 Nuchal Fold:      Not applicable (>20    Cord Vessels:     Previously seen
                   wks GA)
 Face:             Appears normal         Kidneys:          Appear normal
                   (orbits and profile)
 Lips:             Previously seen        Bladder:          Appears normal
 Heart:            Appears normal         Spine:            Previously seen
                   (4CH, axis, and
                   situs)
 RVOT:             Appears normal         Lower             Previously seen
                                          Extremities:
 LVOT:             Appears normal         Upper             Previously seen
                                          Extremities:

 Other:  Female gender. Heels and 5th digit previously seen.
Cervix Uterus Adnexa

 Cervix:       Not visualized (advanced GA >34 wks)
 Left Ovary:   Within normal limits.
 Right Ovary:  Within normal limits.

 Adnexa:     No abnormality visualized.
Impression

 Single intrauterine gestation demonstrating an estimated
 gestational age by ultrasound of 37w 4d. This is correlated
 with expected estimated gestational age by LMP of 36w 6d.
 EFW is currently at the 65%.

 No late developing fetal anatomic abnormalities are noted
 associated with the lateral ventricles, four chamber heart,
 stomach, kidneys or bladder.

 Subjectively and quantitatively normal amniotic fluid volume.

 questions or concerns.

## 2015-01-26 ENCOUNTER — Encounter (HOSPITAL_COMMUNITY): Payer: Self-pay | Admitting: Emergency Medicine

## 2015-01-26 ENCOUNTER — Emergency Department (HOSPITAL_COMMUNITY)
Admission: EM | Admit: 2015-01-26 | Discharge: 2015-01-26 | Disposition: A | Discharge status: Home / Self Care | Payer: Medicaid Other | Attending: Emergency Medicine | Admitting: Emergency Medicine

## 2015-01-26 DIAGNOSIS — Z79899 Other long term (current) drug therapy: Secondary | ICD-10-CM | POA: Insufficient documentation

## 2015-01-26 DIAGNOSIS — Z792 Long term (current) use of antibiotics: Secondary | ICD-10-CM | POA: Insufficient documentation

## 2015-01-26 DIAGNOSIS — K088 Other specified disorders of teeth and supporting structures: Secondary | ICD-10-CM | POA: Insufficient documentation

## 2015-01-26 DIAGNOSIS — K029 Dental caries, unspecified: Secondary | ICD-10-CM | POA: Insufficient documentation

## 2015-01-26 MED ORDER — PENICILLIN V POTASSIUM 500 MG PO TABS
500.0000 mg | ORAL_TABLET | Freq: Three times a day (TID) | ORAL | Status: DC
Start: 2015-01-26 — End: 2015-09-10

## 2015-01-26 MED ORDER — IBUPROFEN 800 MG PO TABS: 800.0000 mg | ORAL_TABLET | Freq: Three times a day (TID) | ORAL | Status: DC

## 2015-01-26 NOTE — ED Notes (Signed)
C/o bilateral, lower "wisdom tooth" pain, on and off for 2 years, worse last few days.

## 2015-01-26 NOTE — ED Provider Notes (Signed)
CSN: [id number]     Arrival date & time 01/26/15  0915 History  This chart was scribed for non-physician practitioner, Dierdre Forth, PA-C working with Melene Plan, DO, by Jarvis Morgan, ED Scribe. This patient was seen in room TR07C/TR07C and the patient's care was started at 9:40 AM.     Chief Complaint  Patient presents with  . Dental Pain    The history is provided by the patient and medical records. No language interpreter was used.    HPI Comments: Tiffany Ayers is a 39 y.o. female who presents to the Emergency Department complaining of intermittent, bilateral, "sore", lower tooth pain onset 2 years. She states the pain is located in her 3rd molars and that she broke the tooth 2 years ago. She is complaining of associated jaw soreness on the right which began 3 days ago. She reports she tried to get the left tooth pulled 2 years ago but was unable to because she was in her first trimester of pregnancy. Pt states the pain is exacerbated when she bites down. She reports nothing makes the pain better. She has not taken anything for the pain. She denies any fever, chills, facial swelling or trouble swallowing.  Past Medical History  Diagnosis Date  . Anti-phospholipid antibody syndrome   . 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 S/P cesarean section 08/21/2011  . Medical history non-contributory    Past Surgical History  Procedure Laterality Date  . Laparoscopic gastric banding    . Cesarean section  2004  . Cesarean section  08/21/2011    Procedure: CESAREAN SECTION;  Surgeon: Sherron Monday, MD;  Location: [facility];  Service: Gynecology;  Laterality: N/A;  . Knee arthroscopy  06/05/1998  . Tonsillectomy    . Tonsillectomy    . Cesarean section with bilateral tubal ligation Bilateral 02/01/2013    Procedure: REPEAT CESAREAN SECTION WITH BILATERAL TUBAL LIGATION;  Surgeon: Catalina Antigua, MD;  Location: [facility];  Service: Obstetrics;  Laterality: Bilateral;   Family History  Problem  Relation Age of Onset  . Diabetes Maternal Grandmother   . Cleft lip Sister   . Heart disease Maternal Grandmother   . Heart disease Maternal Grandfather    Social History  Substance Use Topics  . Smoking status: Never Smoker   . Smokeless tobacco: Never Used  . Alcohol Use: No     Comment: occasional   OB History    Gravida Para Term Preterm AB TAB SAB Ectopic Multiple Living   Review of Systems  Constitutional: Negative for fever, chills and appetite change.  HENT: Positive for dental problem. Negative for drooling, ear pain, facial swelling, nosebleeds, postnasal drip, rhinorrhea and trouble swallowing.   Eyes: Negative for pain and redness.  Respiratory: Negative for cough and wheezing.   Cardiovascular: Negative for chest pain.  Gastrointestinal: Negative for nausea, vomiting and abdominal pain.  Musculoskeletal: Negative for neck pain and neck stiffness.  Skin: Negative for color change and rash.  Neurological: Negative for weakness, light-headedness and headaches.  All other systems reviewed and are negative.     Allergies  Vicodin  Home Medications   Prior to Admission medications   Medication Sig Start Date End Date Taking? Authorizing Provider  ciprofloxacin (CIPRO) 500 MG tablet Take 1 tablet (500 mg total) by mouth 2 (two) times daily. 12/08/14   Reva Bores, MD  ibuprofen (ADVIL,MOTRIN) 800 MG tablet Take 1 tablet (800  mg total) by mouth 3 (three) times daily. 01/26/15   Lovely Kerins, PA-C  ondansetron (ZOFRAN ODT) 4 MG disintegrating tablet Take 1 tablet (4 mg total) by mouth every 8 (eight) hours as needed for nausea. 05/17/14   Garlon Hatchet, PA-C  penicillin v potassium (VEETID) 500 MG tablet Take 1 tablet (500 mg total) by mouth 3 (three) times daily. 01/26/15   Charlii Yost, PA-C  sulfamethoxazole-trimethoprim (BACTRIM DS,SEPTRA DS) 800-160 MG per tablet Take 1 tablet by mouth 2 (two) times daily. 12/04/14   Reva Bores,  MD   Triage Vitals: BP 144/80 mmHg  Pulse 81  Temp(Src) 98.4 F (36.9 C)  Resp 12  SpO2 100%  LMP 01/11/2015  Physical Exam  Constitutional: She is oriented to person, place, and time. She appears well-developed and well-nourished. No distress.  HENT:  Head: Normocephalic and atraumatic.  Right Ear: Tympanic membrane, external ear and ear canal normal.  Left Ear: Tympanic membrane, external ear and ear canal normal.  Nose: Nose normal. Right sinus exhibits no maxillary sinus tenderness and no frontal sinus tenderness. Left sinus exhibits no maxillary sinus tenderness and no frontal sinus tenderness.  Mouth/Throat: Uvula is midline, oropharynx is clear and moist and mucous membranes are normal. No oral lesions. Abnormal dentition. Dental caries present. No uvula swelling or lacerations. No oropharyngeal exudate, posterior oropharyngeal edema, posterior oropharyngeal erythema or tonsillar abscesses.  No gingival swelling, fluctuance or induration No gross abscess Large dental caries of tooth #17 and 31 No tenderness or induration to the floor of the mouth or gingiva  Eyes: Conjunctivae and EOM are normal. Pupils are equal, round, and reactive to light. Right eye exhibits no discharge. Left eye exhibits no discharge.  Neck: Normal range of motion. Neck supple. No tracheal deviation present.  No stridor Handling secretions without difficulty No nuchal rigidity No cervical lymphadenopathy   Cardiovascular: Normal rate, regular rhythm and normal heart sounds.   Pulmonary/Chest: Effort normal. No respiratory distress.  Equal chest rise  Abdominal: Soft. Bowel sounds are normal. She exhibits no distension. There is no tenderness.  Musculoskeletal: Normal range of motion.  Lymphadenopathy:    She has no cervical adenopathy.  Neurological: She is alert and oriented to person, place, and time.  Skin: Skin is warm and dry.  Psychiatric: She has a normal mood and affect. Her behavior is  normal.  Nursing note and vitals reviewed.   ED Course  Procedures (including critical care time)  DIAGNOSTIC STUDIES: Oxygen Saturation is 100% on RA, normal by my interpretation.    COORDINATION OF CARE:    Labs Review Labs Reviewed - No data to display  Imaging Review No results found. I have personally reviewed and evaluated these images and lab results as part of my medical decision-making.   EKG Interpretation None      MDM   Final diagnoses:  Pain due to dental caries   Ronne Binning presents with dental pain.  Patient with toothache.  No gross abscess.  Exam unconcerning for Ludwig's angina or spread of infection.  Will treat with penicillin and ibuprofen.  Urged patient to follow-up with dentist.    BP 144/80 mmHg  Pulse 81  Temp(Src) 98.4 F (36.9 C)  Resp 12  SpO2 100%  LMP 01/11/2015  I personally performed the services described in this documentation, which was scribed in my presence. The recorded information has been reviewed and is accurate.   Dierdre Forth, PA-C 01/26/15 1010  Melene Plan, DO 01/26/15 1530

## 2015-01-26 NOTE — Discharge Instructions (Signed)
1. Medications: penicillin, ibuprofen, usual home medications 2. Treatment: rest, drink plenty of fluids, take medications as prescribed 3. Follow Up: Please followup with dentistry within 1 week for discussion of your diagnoses and further evaluation after today's visit; if you do not have a primary care doctor use the resource guide provided to find one; Return to the ER for high fevers, difficulty breathing, difficulty swallowing or other concerning symptoms    Dental Pain A tooth ache may be caused by cavities (tooth decay). Cavities expose the nerve of the tooth to air and hot or cold temperatures. It may come from an infection or abscess (also called a boil or furuncle) around your tooth. It is also often caused by dental caries (tooth decay). This causes the pain you are having. DIAGNOSIS  Your caregiver can diagnose this problem by exam. TREATMENT   If caused by an infection, it may be treated with medications which kill germs (antibiotics) and pain medications as prescribed by your caregiver. Take medications as directed.  Only take over-the-counter or prescription medicines for pain, discomfort, or fever as directed by your caregiver.  Whether the tooth ache today is caused by infection or dental disease, you should see your dentist as soon as possible for further care. SEEK MEDICAL CARE IF: The exam and treatment you received today has been provided on an emergency basis only. This is not a substitute for complete medical or dental care. If your problem worsens or new problems (symptoms) appear, and you are unable to meet with your dentist, call or return to this location. SEEK IMMEDIATE MEDICAL CARE IF:   You have a fever.  You develop redness and swelling of your face, jaw, or neck.  You are unable to open your mouth.  You have severe pain uncontrolled by pain medicine. MAKE SURE YOU:   Understand these instructions.  Will watch your condition.  Will get help right away  if you are not doing well or get worse. Document Released: 05/22/2005 Document Revised: 08/14/2011 Document Reviewed: 01/08/2008 [facility] Patient Information 2015 [place], [place]. This information is not intended to replace advice given to you by your health care provider. Make sure you discuss any questions you have with your health care provider.

## 2015-01-26 NOTE — ED Notes (Signed)
Declined W/C at D/C and was escorted to lobby by RN. 

## 2015-08-19 ENCOUNTER — Ambulatory Visit: Payer: Medicaid Other | Admitting: Obstetrics & Gynecology

## 2015-09-10 ENCOUNTER — Ambulatory Visit (INDEPENDENT_AMBULATORY_CARE_PROVIDER_SITE_OTHER): Payer: Medicaid Other | Admitting: Obstetrics & Gynecology

## 2015-09-10 ENCOUNTER — Encounter: Payer: Self-pay | Admitting: Obstetrics & Gynecology

## 2015-09-10 VITALS — BP 115/78 | HR 52 | Resp 16 | Ht 65.0 in | Wt 205.0 lb

## 2015-09-10 DIAGNOSIS — N760 Acute vaginitis: Secondary | ICD-10-CM

## 2015-09-10 DIAGNOSIS — Z124 Encounter for screening for malignant neoplasm of cervix: Secondary | ICD-10-CM

## 2015-09-10 DIAGNOSIS — N762 Acute vulvitis: Secondary | ICD-10-CM

## 2015-09-10 DIAGNOSIS — Z113 Encounter for screening for infections with a predominantly sexual mode of transmission: Secondary | ICD-10-CM

## 2015-09-10 DIAGNOSIS — Z01419 Encounter for gynecological examination (general) (routine) without abnormal findings: Secondary | ICD-10-CM

## 2015-09-10 DIAGNOSIS — N390 Urinary tract infection, site not specified: Secondary | ICD-10-CM

## 2015-09-10 DIAGNOSIS — B9689 Other specified bacterial agents as the cause of diseases classified elsewhere: Secondary | ICD-10-CM

## 2015-09-10 DIAGNOSIS — A499 Bacterial infection, unspecified: Secondary | ICD-10-CM

## 2015-09-10 DIAGNOSIS — Z1151 Encounter for screening for human papillomavirus (HPV): Secondary | ICD-10-CM

## 2015-09-10 LAB — POCT URINALYSIS DIPSTICK
Glucose, UA: NEGATIVE
Ketones, UA: NEGATIVE
Nitrite, UA: POSITIVE
Protein, UA: NEGATIVE
Spec Grav, UA: 1.01
Urobilinogen, UA: 0.2
pH, UA: 7

## 2015-09-10 MED ORDER — FLUCONAZOLE 150 MG PO TABS: 150.0000 mg | ORAL_TABLET | ORAL | Status: DC

## 2015-09-10 MED ORDER — NYSTATIN-TRIAMCINOLONE 100000-0.1 UNIT/GM-% EX OINT: 1.0000 "application " | TOPICAL_OINTMENT | Freq: Two times a day (BID) | CUTANEOUS | Status: DC

## 2015-09-10 MED ORDER — CIPROFLOXACIN HCL 500 MG PO TABS
500.0000 mg | ORAL_TABLET | Freq: Two times a day (BID) | ORAL | Status: DC
Start: 2015-09-10 — End: 2016-04-18

## 2015-09-10 NOTE — Progress Notes (Signed)
GYNECOLOGY CLINIC ANNUAL PREVENTATIVE CARE ENCOUNTER NOTE  Subjective:   Tiffany Ayers is a 40 y.o. [id number] female here for a routine annual gynecologic exam.  Current complaints: dysuria, urgency and polyuria for several days.  No abdominal pain or fevers.  Also reports having itchy vulva area and thick, white discharge for a few weeks. 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   Denies abnormal vaginal bleeding, pelvic pain or other gynecologic concerns.    Gynecologic History Patient's last menstrual period was 08/28/2015. Contraception: tubal ligation Last Pap: 10/03/2012. Results were: Normal pap, negative HRHPV, GC and Chlam.  Obstetric History OB History  Gravida Para Term Preterm AB SAB TAB Ectopic Multiple Living  7 4 4  3 3    4     # Outcome Date GA Lbr Len/2nd Weight Sex Delivery Anes PTL Lv  7 Term 02/01/13 4521w0d  7 lb 1.8 oz (3.225 kg) F CS-Vac Spinal  Y  6 Term 08/21/11 8362w3d  7 lb 11.6 oz (3.505 kg) M CS-Vac Spinal  Y     Comments: ERLTCS  5 SAB 10/2010 4963w0d         4 SAB 2010 5963w0d         3 SAB 2008 2063w0d         2 Term 10/23/02 2872w3d  6 lb 12 oz (3.062 kg) M CS-LTranv EPI  Y     Comments: Emergent  due NRFHR  1 Term     M    Y      Past Medical History  Diagnosis Date  . Anti-phospholipid antibody syndrome (HCC)   . 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 S/P cesarean section 08/21/2011  . Medical history non-contributory     Past Surgical History  Procedure Laterality Date  . Laparoscopic gastric banding    . Cesarean section  2004  . Cesarean section  08/21/2011    Procedure: CESAREAN SECTION;  Surgeon: Sherron MondayJody Bovard, MD;  Location: [facility];  Service: Gynecology;  Laterality: N/A;  . Knee arthroscopy  06/05/1998  . Tonsillectomy    . Tonsillectomy    . Cesarean section with bilateral tubal ligation Bilateral 02/01/2013    Procedure: REPEAT CESAREAN SECTION WITH BILATERAL TUBAL LIGATION;  Surgeon: Catalina AntiguaPeggy Constant, MD;   Location: [facility];  Service: Obstetrics;  Laterality: Bilateral;    Current Outpatient Prescriptions on File Prior to Visit  Medication Sig Dispense Refill  . [DISCONTINUED] enoxaparin (LOVENOX) 40 MG/0.4ML SOLN Inject 40 mg into the skin daily.     No current facility-administered medications on file prior to visit.    Allergies  Allergen Reactions  . Vicodin [Hydrocodone-Acetaminophen] Itching and Nausea Only    Social History   Social History  . Marital Status: Married    Spouse Name: N/A  . Number of Children: N/A  . Years of Education: N/A   Occupational History  . Not on file.   Social History Main Topics  . Smoking status: Never Smoker   . Smokeless tobacco: Never Used  . Alcohol Use: No     Comment: occasional  . Drug Use: No  . Sexual Activity:    Partners: Male    Birth Control/ Protection: None   Other Topics Concern  . Not on file   Social History Narrative    Family History  Problem Relation Age of Onset  . Diabetes Maternal Grandmother   . Cleft lip Sister   . Heart disease Maternal Grandmother   .  Heart disease Maternal Grandfather     The following portions of the patient's history were reviewed and updated as appropriate: allergies, current medications, past family history, past medical history, past social history, past surgical history and problem list.  Review of Systems Pertinent items noted in HPI and remainder of comprehensive ROS otherwise negative.   Objective:  BP 115/78 mmHg  Pulse 52  Resp 16  Ht  (1.651 m)  Wt 205 lb (92.987 kg)  BMI 34.11 kg/m2  LMP 08/28/2015 CONSTITUTIONAL: Well-developed, well-nourished female in no acute distress.  HENT:  Normocephalic, atraumatic, External right and left ear normal. Oropharynx is clear and moist EYES: Conjunctivae and EOM are normal. Pupils are equal, round, and reactive to light. No scleral icterus.  NECK: Normal range of motion, supple, no masses.  Normal thyroid.  SKIN: Skin  is warm and dry. No rash noted. Not diaphoretic. No erythema. No pallor. NEUROLOGIC: Alert and oriented to person, place, and time. Normal reflexes, muscle tone coordination. No cranial nerve deficit noted. PSYCHIATRIC: Normal mood and affect. Normal behavior. Normal judgment and thought content. CARDIOVASCULAR: Normal heart rate noted, regular rhythm RESPIRATORY: Clear to auscultation bilaterally. Effort and breath sounds normal, no problems with respiration noted. BREASTS: Symmetric in size. No masses, skin changes, nipple drainage, or lymphadenopathy. ABDOMEN: Soft, normal bowel sounds, no distention noted.  No tenderness, rebound or guarding.  PELVIC: Normal appearing external genitalia with mild erythema noted on inner parts of bilateral labia and mons pubis, some excoriation noted but no lesions; mildly erythematous vaginal mucosa and normal cervix.  White, curd-like discharge noted.  Pap smear obtained.  Normal uterine size, no other palpable masses, no uterine or adnexal tenderness. MUSCULOSKELETAL: Normal range of motion. No tenderness.  No cyanosis, clubbing, or edema.  2+ distal pulses.  Results for orders placed or performed in visit on 09/10/15 (from the past 24 hour(s))  POCT Urinalysis Dipstick     Status: Abnormal   Collection Time: 09/10/15 11:40 AM  Result Value Ref Range   Color, UA Yellow    Clarity, UA Clear    Glucose, UA Negative    Bilirubin, UA Small    Ketones, UA Negative    Spec Grav, UA 1.010    Blood, UA Trace    pH, UA 7.0    Protein, UA Negative    Urobilinogen, UA 0.2    Nitrite, UA Positive    Leukocytes, UA small (1+) (A) Negative     Assessment and Plan:   1. Encounter for routine gynecological examination - Cytology - PAP Will follow up results of pap smear and manage accordingly. Routine preventative health maintenance measures emphasized.  2. UTI (lower urinary tract infection) Positive UA, Ciprofloxacin prescribed. Will follow up  culture. - POCT Urinalysis Dipstick - Urine culture - ciprofloxacin (CIPRO) 500 MG tablet; Take 1 tablet (500 mg total) by mouth 2 (two) times daily.  Dispense: 6 tablet; Refill: 0   3. Vaginitis/Vulvitis Ancillary testing done off pap.  Diflucan presumptively prescribed.  Mycology cream also prescribed. - Cytology - PAP - fluconazole (DIFLUCAN) 150 MG tablet; Take 1 tablet (150 mg total) by mouth every 3 (three) days. For three doses  Dispense: 3 tablet; Refill: 3 - nystatin-triamcinolone ointment (MYCOLOG); Apply 1 application topically 2 (two) times daily.  Dispense: 30 g; Refill: 0  Please refer to After Visit Summary for other counseling recommendations.   Jaynie Collins, MD, FACOG Attending Obstetrician & Gynecologist, [facility] [employer]

## 2015-09-10 NOTE — Patient Instructions (Signed)
Thank you for enrolling in MyChart. Please follow the instructions below to securely access your online medical record. MyChart allows you to send messages to your doctor, view your test results, manage appointments, and more.   How Do I Sign Up? 1. In your Internet browser, go to the Address Bar and enter [email or web address]. 2. Click on the Sign Up Now link in the Sign In box. You will see the New Member Sign Up page. 3. Enter your MyChart Access Code exactly as it appears below. You will not need to use this code after you've completed the sign-up process. If you do not sign up before the expiration date, you must request a new code.  MyChart Access Code: [email or web address] Expires: 10/10/2015  3:26 PM  4. Enter your Social Security Number ([id number]) and Date of Birth (mm/dd/yyyy) as indicated and click Submit. You will be taken to the next sign-up page. 5. Create a MyChart ID. This will be your MyChart login ID and cannot be changed, so think of one that is secure and easy to remember. 6. Create a MyChart password. You can change your password at any time. 7. Enter your Password Reset Question and Answer. This can be used at a later time if you forget your password.  8. Enter your e-mail address. You will receive e-mail notification when new information is available in MyChart. 9. Click Sign Up. You can now view your medical record.   Additional Information Remember, MyChart is NOT to be used for urgent needs. For medical emergencies, dial 911.  Preventive Care for Adults, Female A healthy lifestyle and preventive care can promote health and wellness. Preventive health guidelines for women include the following key practices.  A routine yearly physical is a good way to check with your health care provider about your health and preventive screening. It is a chance to share any concerns and updates on your health and to receive a thorough exam.  Visit your dentist for a routine  exam and preventive care every 6 months. Brush your teeth twice a day and floss once a day. Good oral hygiene prevents tooth decay and gum disease.  The frequency of eye exams is based on your age, health, family medical history, use of contact lenses, and other factors. Follow your health care provider's recommendations for frequency of eye exams.  Eat a healthy diet. Foods like vegetables, fruits, whole grains, low-fat dairy products, and lean protein foods contain the nutrients you need without too many calories. Decrease your intake of foods high in solid fats, added sugars, and salt. Eat the right amount of calories for you.Get information about a proper diet from your health care provider, if necessary.  Regular physical exercise is one of the most important things you can do for your health. Most adults should get at least 150 minutes of moderate-intensity exercise (any activity that increases your heart rate and causes you to sweat) each week. In addition, most adults need muscle-strengthening exercises on 2 or more days a week.  Maintain a healthy weight. The body mass index (BMI) is a screening tool to identify possible weight problems. It provides an estimate of body fat based on height and weight. Your health care provider can find your BMI and can help you achieve or maintain a healthy weight.For adults 20 years and older:  A BMI below 18.5 is considered underweight.  A BMI of 18.5 to 24.9 is normal.  A BMI of 25 to 29.9 is considered overweight.  A BMI   of 30 and above is considered obese.  Maintain normal blood lipids and cholesterol levels by exercising and minimizing your intake of saturated fat. Eat a balanced diet with plenty of fruit and vegetables. Blood tests for lipids and cholesterol should begin at age 12 and be repeated every 5 years. If your lipid or cholesterol levels are high, you are over 50, or you are at high risk for heart disease, you may need your cholesterol  levels checked more frequently.Ongoing high lipid and cholesterol levels should be treated with medicines if diet and exercise are not working.  If you smoke, find out from your health care provider how to quit. If you do not use tobacco, do not start.  Lung cancer screening is recommended for adults aged 63-80 years who are at high risk for developing lung cancer because of a history of smoking. A yearly low-dose CT scan of the lungs is recommended for people who have at least a 30-pack-year history of smoking and are a current smoker or have quit within the past 15 years. A pack year of smoking is smoking an average of 1 pack of cigarettes a day for 1 year (for example: 1 pack a day for 30 years or 2 packs a day for 15 years). Yearly screening should continue until the smoker has stopped smoking for at least 15 years. Yearly screening should be stopped for people who develop a health problem that would prevent them from having lung cancer treatment.  If you are pregnant, do not drink alcohol. If you are breastfeeding, be very cautious about drinking alcohol. If you are not pregnant and choose to drink alcohol, do not have more than 1 drink per day. One drink is considered to be 12 ounces (355 mL) of beer, 5 ounces (148 mL) of wine, or 1.5 ounces (44 mL) of liquor.  Avoid use of street drugs. Do not share needles with anyone. Ask for help if you need support or instructions about stopping the use of drugs.  High blood pressure causes heart disease and increases the risk of stroke. Your blood pressure should be checked at least every 1 to 2 years. Ongoing high blood pressure should be treated with medicines if weight loss and exercise do not work.  If you are 63-62 years old, ask your health care provider if you should take aspirin to prevent strokes.  Diabetes screening is done by taking a blood sample to check your blood glucose level after you have not eaten for a certain period of time (fasting).  If you are not overweight and you do not have risk factors for diabetes, you should be screened once every 3 years starting at age 55. If you are overweight or obese and you are 61-33 years of age, you should be screened for diabetes every year as part of your cardiovascular risk assessment.  Breast cancer screening is essential preventive care for women. You should practice "breast self-awareness." This means understanding the normal appearance and feel of your breasts and may include breast self-examination. Any changes detected, no matter how small, should be reported to a health care provider. Women in their 62s and 30s should have a clinical breast exam (CBE) by a health care provider as part of a regular health exam every 1 to 3 years. After age 33, women should have a CBE every year. Starting at age 88, women should consider having a mammogram (breast X-ray test) every year. Women who have a family history of breast cancer  should talk to their health care provider about genetic screening. Women at a high risk of breast cancer should talk to their health care providers about having an MRI and a mammogram every year.  Breast cancer gene (BRCA)-related cancer risk assessment is recommended for women who have family members with BRCA-related cancers. BRCA-related cancers include breast, ovarian, tubal, and peritoneal cancers. Having family members with these cancers may be associated with an increased risk for harmful changes (mutations) in the breast cancer genes BRCA1 and BRCA2. Results of the assessment will determine the need for genetic counseling and BRCA1 and BRCA2 testing.  Your health care provider may recommend that you be screened regularly for cancer of the pelvic organs (ovaries, uterus, and vagina). This screening involves a pelvic examination, including checking for microscopic changes to the surface of your cervix (Pap test). You may be encouraged to have this screening done every 3 years,  beginning at age 59.  For women ages 71-65, health care providers may recommend pelvic exams and Pap testing every 3 years, or they may recommend the Pap and pelvic exam, combined with testing for human papilloma virus (HPV), every 5 years. Some types of HPV increase your risk of cervical cancer. Testing for HPV may also be done on women of any age with unclear Pap test results.  Other health care providers may not recommend any screening for nonpregnant women who are considered low risk for pelvic cancer and who do not have symptoms. Ask your health care provider if a screening pelvic exam is right for you.  If you have had past treatment for cervical cancer or a condition that could lead to cancer, you need Pap tests and screening for cancer for at least 20 years after your treatment. If Pap tests have been discontinued, your risk factors (such as having a new sexual partner) need to be reassessed to determine if screening should resume. Some women have medical problems that increase the chance of getting cervical cancer. In these cases, your health care provider may recommend more frequent screening and Pap tests.  Colorectal cancer can be detected and often prevented. Most routine colorectal cancer screening begins at the age of 70 years and continues through age 28 years. However, your health care provider may recommend screening at an earlier age if you have risk factors for colon cancer. On a yearly basis, your health care provider may provide home test kits to check for hidden blood in the stool. Use of a small camera at the end of a tube, to directly examine the colon (sigmoidoscopy or colonoscopy), can detect the earliest forms of colorectal cancer. Talk to your health care provider about this at age 31, when routine screening begins. Direct exam of the colon should be repeated every 5-10 years through age 5 years, unless early forms of precancerous polyps or small growths are found.  People  who are at an increased risk for hepatitis B should be screened for this virus. You are considered at high risk for hepatitis B if:  You were born in a country where hepatitis B occurs often. Talk with your health care provider about which countries are considered high risk.  Your parents were born in a high-risk country and you have not received a shot to protect against hepatitis B (hepatitis B vaccine).  You have HIV or AIDS.  You use needles to inject street drugs.  You live with, or have sex with, someone who has hepatitis B.  You get hemodialysis treatment.  You take certain medicines for conditions like cancer, organ transplantation, and autoimmune conditions.  Hepatitis C blood testing is recommended for all people born from 1945 through 1965 and any individual with known risks for hepatitis C.  Practice safe sex. Use condoms and avoid high-risk sexual practices to reduce the spread of sexually transmitted infections (STIs). STIs include gonorrhea, chlamydia, syphilis, trichomonas, herpes, HPV, and human immunodeficiency virus (HIV). Herpes, HIV, and HPV are viral illnesses that have no cure. They can result in disability, cancer, and death.  You should be screened for sexually transmitted illnesses (STIs) including gonorrhea and chlamydia if:  You are sexually active and are younger than 24 years.  You are older than 24 years and your health care provider tells you that you are at risk for this type of infection.  Your sexual activity has changed since you were last screened and you are at an increased risk for chlamydia or gonorrhea. Ask your health care provider if you are at risk.  If you are at risk of being infected with HIV, it is recommended that you take a prescription medicine daily to prevent HIV infection. This is called preexposure prophylaxis (PrEP). You are considered at risk if:  You are sexually active and do not regularly use condoms or know the HIV status of  your partner(s).  You take drugs by injection.  You are sexually active with a partner who has HIV.  Talk with your health care provider about whether you are at high risk of being infected with HIV. If you choose to begin PrEP, you should first be tested for HIV. You should then be tested every 3 months for as long as you are taking PrEP.  Osteoporosis is a disease in which the bones lose minerals and strength with aging. This can result in serious bone fractures or breaks. The risk of osteoporosis can be identified using a bone density scan. Women ages 65 years and over and women at risk for fractures or osteoporosis should discuss screening with their health care providers. Ask your health care provider whether you should take a calcium supplement or vitamin D to reduce the rate of osteoporosis.  Menopause can be associated with physical symptoms and risks. Hormone replacement therapy is available to decrease symptoms and risks. You should talk to your health care provider about whether hormone replacement therapy is right for you.  Use sunscreen. Apply sunscreen liberally and repeatedly throughout the day. You should seek shade when your shadow is shorter than you. Protect yourself by wearing long sleeves, pants, a wide-brimmed hat, and sunglasses year round, whenever you are outdoors.  Once a month, do a whole body skin exam, using a mirror to look at the skin on your back. Tell your health care provider of new moles, moles that have irregular borders, moles that are larger than a pencil eraser, or moles that have changed in shape or color.  Stay current with required vaccines (immunizations).  Influenza vaccine. All adults should be immunized every year.  Tetanus, diphtheria, and acellular pertussis (Td, Tdap) vaccine. Pregnant women should receive 1 dose of Tdap vaccine during each pregnancy. The dose should be obtained regardless of the length of time since the last dose. Immunization is  preferred during the 27th-36th week of gestation. An adult who has not previously received Tdap or who does not know her vaccine status should receive 1 dose of Tdap. This initial dose should be followed by tetanus and diphtheria toxoids (Td) booster doses every   10 years. Adults with an unknown or incomplete history of completing a 3-dose immunization series with Td-containing vaccines should begin or complete a primary immunization series including a Tdap dose. Adults should receive a Td booster every 10 years.  Varicella vaccine. An adult without evidence of immunity to varicella should receive 2 doses or a second dose if she has previously received 1 dose. Pregnant females who do not have evidence of immunity should receive the first dose after pregnancy. This first dose should be obtained before leaving the health care facility. The second dose should be obtained 4-8 weeks after the first dose.  Human papillomavirus (HPV) vaccine. Females aged 13-26 years who have not received the vaccine previously should obtain the 3-dose series. The vaccine is not recommended for use in pregnant females. However, pregnancy testing is not needed before receiving a dose. If a female is found to be pregnant after receiving a dose, no treatment is needed. In that case, the remaining doses should be delayed until after the pregnancy. Immunization is recommended for any person with an immunocompromised condition through the age of 26 years if she did not get any or all doses earlier. During the 3-dose series, the second dose should be obtained 4-8 weeks after the first dose. The third dose should be obtained 24 weeks after the first dose and 16 weeks after the second dose.  Zoster vaccine. One dose is recommended for adults aged 60 years or older unless certain conditions are present.  Measles, mumps, and rubella (MMR) vaccine. Adults born before 1957 generally are considered immune to measles and mumps. Adults born in 1957  or later should have 1 or more doses of MMR vaccine unless there is a contraindication to the vaccine or there is laboratory evidence of immunity to each of the three diseases. A routine second dose of MMR vaccine should be obtained at least 28 days after the first dose for students attending postsecondary schools, health care workers, or international travelers. People who received inactivated measles vaccine or an unknown type of measles vaccine during 1963-1967 should receive 2 doses of MMR vaccine. People who received inactivated mumps vaccine or an unknown type of mumps vaccine before 1979 and are at high risk for mumps infection should consider immunization with 2 doses of MMR vaccine. For females of childbearing age, rubella immunity should be determined. If there is no evidence of immunity, females who are not pregnant should be vaccinated. If there is no evidence of immunity, females who are pregnant should delay immunization until after pregnancy. Unvaccinated health care workers born before 1957 who lack laboratory evidence of measles, mumps, or rubella immunity or laboratory confirmation of disease should consider measles and mumps immunization with 2 doses of MMR vaccine or rubella immunization with 1 dose of MMR vaccine.  Pneumococcal 13-valent conjugate (PCV13) vaccine. When indicated, a person who is uncertain of his immunization history and has no record of immunization should receive the PCV13 vaccine. All adults 65 years of age and older should receive this vaccine. An adult aged 19 years or older who has certain medical conditions and has not been previously immunized should receive 1 dose of PCV13 vaccine. This PCV13 should be followed with a dose of pneumococcal polysaccharide (PPSV23) vaccine. Adults who are at high risk for pneumococcal disease should obtain the PPSV23 vaccine at least 8 weeks after the dose of PCV13 vaccine. Adults older than 39 years of age who have normal immune system  function should obtain the PPSV23 vaccine   dose at least 1 year after the dose of PCV13 vaccine.  Pneumococcal polysaccharide (PPSV23) vaccine. When PCV13 is also indicated, PCV13 should be obtained first. All adults aged 65 years and older should be immunized. An adult younger than age 65 years who has certain medical conditions should be immunized. Any person who resides in a nursing home or long-term care facility should be immunized. An adult smoker should be immunized. People with an immunocompromised condition and certain other conditions should receive both PCV13 and PPSV23 vaccines. People with human immunodeficiency virus (HIV) infection should be immunized as soon as possible after diagnosis. Immunization during chemotherapy or radiation therapy should be avoided. Routine use of PPSV23 vaccine is not recommended for American Indians, [place] Natives, or people younger than 65 years unless there are medical conditions that require PPSV23 vaccine. When indicated, people who have unknown immunization and have no record of immunization should receive PPSV23 vaccine. One-time revaccination 5 years after the first dose of PPSV23 is recommended for people aged 19-64 years who have chronic kidney failure, nephrotic syndrome, asplenia, or immunocompromised conditions. People who received 1-2 doses of PPSV23 before age 65 years should receive another dose of PPSV23 vaccine at age 65 years or later if at least 5 years have passed since the previous dose. Doses of PPSV23 are not needed for people immunized with PPSV23 at or after age 65 years.  Meningococcal vaccine. Adults with asplenia or persistent complement component deficiencies should receive 2 doses of quadrivalent meningococcal conjugate (MenACWY-D) vaccine. The doses should be obtained at least 2 months apart. Microbiologists working with certain meningococcal bacteria, military recruits, people at risk during an outbreak, and people who travel to or live  in countries with a high rate of meningitis should be immunized. A first-year college student up through age 21 years who is living in a residence hall should receive a dose if she did not receive a dose on or after her 16th birthday. Adults who have certain high-risk conditions should receive one or more doses of vaccine.  Hepatitis A vaccine. Adults who wish to be protected from this disease, have certain high-risk conditions, work with hepatitis A-infected animals, work in hepatitis A research labs, or travel to or work in countries with a high rate of hepatitis A should be immunized. Adults who were previously unvaccinated and who anticipate close contact with an international adoptee during the first 60 days after arrival in the [place] from a country with a high rate of hepatitis A should be immunized.  Hepatitis B vaccine. Adults who wish to be protected from this disease, have certain high-risk conditions, may be exposed to blood or other infectious body fluids, are household contacts or sex partners of hepatitis B positive people, are clients or workers in certain care facilities, or travel to or work in countries with a high rate of hepatitis B should be immunized.  Haemophilus influenzae type b (Hib) vaccine. A previously unvaccinated person with asplenia or sickle cell disease or having a scheduled splenectomy should receive 1 dose of Hib vaccine. Regardless of previous immunization, a recipient of a hematopoietic stem cell transplant should receive a 3-dose series 6-12 months after her successful transplant. Hib vaccine is not recommended for adults with HIV infection. Preventive Services / Frequency Ages 19 to 39 years  Blood pressure check.** / Every 3-5 years.  Lipid and cholesterol check.** / Every 5 years beginning at age 20.  Clinical breast exam.** / Every 3 years for women in their 20s   and 30s.  BRCA-related cancer risk assessment.** / For women who have family members with  a BRCA-related cancer (breast, ovarian, tubal, or peritoneal cancers).  Pap test.** / Every 2 years from ages 16 through 57. Every 3 years starting at age 30 through age 76 or 32 with a history of 3 consecutive normal Pap tests.  HPV screening.** / Every 3 years from ages 75 through ages 2 to 26 with a history of 3 consecutive normal Pap tests.  Hepatitis C blood test.** / For any individual with known risks for hepatitis C.  Skin self-exam. / Monthly.  Influenza vaccine. / Every year.  Tetanus, diphtheria, and acellular pertussis (Tdap, Td) vaccine.** / Consult your health care provider. Pregnant women should receive 1 dose of Tdap vaccine during each pregnancy. 1 dose of Td every 10 years.  Varicella vaccine.** / Consult your health care provider. Pregnant females who do not have evidence of immunity should receive the first dose after pregnancy.  HPV vaccine. / 3 doses over 6 months, if 13 and younger. The vaccine is not recommended for use in pregnant females. However, pregnancy testing is not needed before receiving a dose.  Measles, mumps, rubella (MMR) vaccine.** / You need at least 1 dose of MMR if you were born in 1957 or later. You may also need a 2nd dose. For females of childbearing age, rubella immunity should be determined. If there is no evidence of immunity, females who are not pregnant should be vaccinated. If there is no evidence of immunity, females who are pregnant should delay immunization until after pregnancy.  Pneumococcal 13-valent conjugate (PCV13) vaccine.** / Consult your health care provider.  Pneumococcal polysaccharide (PPSV23) vaccine.** / 1 to 2 doses if you smoke cigarettes or if you have certain conditions.  Meningococcal vaccine.** / 1 dose if you are age 32 to 23 years and a Market researcher living in a residence hall, or have one of several medical conditions, you need to get vaccinated against meningococcal disease. You may also need  additional booster doses.  Hepatitis A vaccine.** / Consult your health care provider.  Hepatitis B vaccine.** / Consult your health care provider.  Haemophilus influenzae type b (Hib) vaccine.** / Consult your health care provider. Ages 58 to 59 years  Blood pressure check.** / Every year.  Lipid and cholesterol check.** / Every 5 years beginning at age 62 years.  Lung cancer screening. / Every year if you are aged 27-80 years and have a 30-pack-year history of smoking and currently smoke or have quit within the past 15 years. Yearly screening is stopped once you have quit smoking for at least 15 years or develop a health problem that would prevent you from having lung cancer treatment.  Clinical breast exam.** / Every year after age 64 years.  BRCA-related cancer risk assessment.** / For women who have family members with a BRCA-related cancer (breast, ovarian, tubal, or peritoneal cancers).  Mammogram.** / Every year beginning at age 61 years and continuing for as long as you are in good health. Consult with your health care provider.  Pap test.** / Every 3 years starting at age 46 years through age 49 or 29 years with a history of 3 consecutive normal Pap tests.  HPV screening.** / Every 3 years from ages 56 years through ages 1 to 66 years with a history of 3 consecutive normal Pap tests.  Fecal occult blood test (FOBT) of stool. / Every year beginning at age 50 years and continuing  until age 75 years. You may not need to do this test if you get a colonoscopy every 10 years.  Flexible sigmoidoscopy or colonoscopy.** / Every 5 years for a flexible sigmoidoscopy or every 10 years for a colonoscopy beginning at age 50 years and continuing until age 75 years.  Hepatitis C blood test.** / For all people born from 1945 through 1965 and any individual with known risks for hepatitis C.  Skin self-exam. / Monthly.  Influenza vaccine. / Every year.  Tetanus, diphtheria, and acellular  pertussis (Tdap/Td) vaccine.** / Consult your health care provider. Pregnant women should receive 1 dose of Tdap vaccine during each pregnancy. 1 dose of Td every 10 years.  Varicella vaccine.** / Consult your health care provider. Pregnant females who do not have evidence of immunity should receive the first dose after pregnancy.  Zoster vaccine.** / 1 dose for adults aged 60 years or older.  Measles, mumps, rubella (MMR) vaccine.** / You need at least 1 dose of MMR if you were born in 1957 or later. You may also need a second dose. For females of childbearing age, rubella immunity should be determined. If there is no evidence of immunity, females who are not pregnant should be vaccinated. If there is no evidence of immunity, females who are pregnant should delay immunization until after pregnancy.  Pneumococcal 13-valent conjugate (PCV13) vaccine.** / Consult your health care provider.  Pneumococcal polysaccharide (PPSV23) vaccine.** / 1 to 2 doses if you smoke cigarettes or if you have certain conditions.  Meningococcal vaccine.** / Consult your health care provider.  Hepatitis A vaccine.** / Consult your health care provider.  Hepatitis B vaccine.** / Consult your health care provider.  Haemophilus influenzae type b (Hib) vaccine.** / Consult your health care provider. Ages 65 years and over  Blood pressure check.** / Every year.  Lipid and cholesterol check.** / Every 5 years beginning at age 20 years.  Lung cancer screening. / Every year if you are aged 55-80 years and have a 30-pack-year history of smoking and currently smoke or have quit within the past 15 years. Yearly screening is stopped once you have quit smoking for at least 15 years or develop a health problem that would prevent you from having lung cancer treatment.  Clinical breast exam.** / Every year after age 40 years.  BRCA-related cancer risk assessment.** / For women who have family members with a BRCA-related  cancer (breast, ovarian, tubal, or peritoneal cancers).  Mammogram.** / Every year beginning at age 40 years and continuing for as long as you are in good health. Consult with your health care provider.  Pap test.** / Every 3 years starting at age 30 years through age 65 or 70 years with 3 consecutive normal Pap tests. Testing can be stopped between 65 and 70 years with 3 consecutive normal Pap tests and no abnormal Pap or HPV tests in the past 10 years.  HPV screening.** / Every 3 years from ages 30 years through ages 65 or 70 years with a history of 3 consecutive normal Pap tests. Testing can be stopped between 65 and 70 years with 3 consecutive normal Pap tests and no abnormal Pap or HPV tests in the past 10 years.  Fecal occult blood test (FOBT) of stool. / Every year beginning at age 50 years and continuing until age 75 years. You may not need to do this test if you get a colonoscopy every 10 years.  Flexible sigmoidoscopy or colonoscopy.** / Every 5 years   for a flexible sigmoidoscopy or every 10 years for a colonoscopy beginning at age 61 years and continuing until age 94 years.  Hepatitis C blood test.** / For all people born from [birth year] through 1964 and any individual with known risks for hepatitis C.  Osteoporosis screening.** / A one-time screening for women ages 22 years and over and women at risk for fractures or osteoporosis.  Skin self-exam. / Monthly.  Influenza vaccine. / Every year.  Tetanus, diphtheria, and acellular pertussis (Tdap/Td) vaccine.** / 1 dose of Td every 10 years.  Varicella vaccine.** / Consult your health care provider.  Zoster vaccine.** / 1 dose for adults aged 66 years or older.  Pneumococcal 13-valent conjugate (PCV13) vaccine.** / Consult your health care provider.  Pneumococcal polysaccharide (PPSV23) vaccine.** / 1 dose for all adults aged 14 years and older.  Meningococcal vaccine.** / Consult your health care provider.  Hepatitis A vaccine.** /  Consult your health care provider.  Hepatitis B vaccine.** / Consult your health care provider.  Haemophilus influenzae type b (Hib) vaccine.** / Consult your health care provider. ** Family history and personal history of risk and conditions may change your health care provider's recommendations.   This information is not intended to replace advice given to you by your health care provider. Make sure you discuss any questions you have with your health care provider.   Document Released: 07/18/2001 Document Revised: 06/12/2014 Document Reviewed: 10/17/2010 Elsevier Interactive Patient Education [employer].

## 2015-09-11 LAB — URINE CULTURE
Colony Count: NO GROWTH
Organism ID, Bacteria: NO GROWTH

## 2015-09-13 LAB — CYTOLOGY - PAP

## 2015-09-16 LAB — CERVICOVAGINAL ANCILLARY ONLY: Candida vaginitis: POSITIVE — AB

## 2015-09-16 MED ORDER — METRONIDAZOLE 500 MG PO TABS: 500.0000 mg | ORAL_TABLET | Freq: Two times a day (BID) | ORAL | Status: DC

## 2015-09-16 NOTE — Addendum Note (Signed)
Addended by: Jaynie CollinsANYANWU, Jeet Shough A on: 09/16/2015 04:59 PM   Modules accepted: Orders

## 2015-09-18 ENCOUNTER — Encounter: Payer: Self-pay | Admitting: Obstetrics & Gynecology

## 2015-09-29 ENCOUNTER — Encounter: Payer: Self-pay | Admitting: Obstetrics & Gynecology

## 2015-09-30 ENCOUNTER — Telehealth: Payer: Self-pay | Admitting: *Deleted

## 2015-09-30 DIAGNOSIS — N760 Acute vaginitis: Secondary | ICD-10-CM

## 2015-09-30 MED ORDER — FLUCONAZOLE 150 MG PO TABS: 150.0000 mg | ORAL_TABLET | Freq: Once | ORAL | Status: DC

## 2015-09-30 NOTE — Telephone Encounter (Signed)
rx for yeast sent to pharmacy.

## 2016-04-18 ENCOUNTER — Encounter: Payer: Self-pay | Admitting: Family Medicine

## 2016-04-18 ENCOUNTER — Ambulatory Visit (INDEPENDENT_AMBULATORY_CARE_PROVIDER_SITE_OTHER): Payer: Medicaid Other | Admitting: Family Medicine

## 2016-04-18 DIAGNOSIS — R519 Headache, unspecified: Secondary | ICD-10-CM | POA: Insufficient documentation

## 2016-04-18 DIAGNOSIS — R51 Headache: Secondary | ICD-10-CM

## 2016-04-18 DIAGNOSIS — G4489 Other headache syndrome: Secondary | ICD-10-CM

## 2016-04-18 NOTE — Assessment & Plan Note (Signed)
Endorses history of tension, migraine, and hormonal headaches. Has tried multiple OTCs and OCPs in the past. Neuro exam normal. Explained rebound headaches, asked patient to keep headache diary, and RTC in 1 month. Will consider referral to neurology vs additional prophylactic meds like TCAs vs propranolol. Of note, patient endorses that all of these are worsened by stress. When questioned about self-care and time for herself, she scoffs. Refused behavioral health warm handoff.

## 2016-04-18 NOTE — Patient Instructions (Signed)
It was nice to meet you today! I'm sorry you are dealing with these headaches. For the next month, please try to keep a log of your headaches and if anything in particular caused them, including food or stress. I would recommend trying to decrease your use of ibuprofen in order to try to avoid rebound headaches. You can also try tylenol for headaches.   Analgesic Rebound Headache An analgesic rebound headache, sometimes called a medication overuse headache, is a headache that comes after pain medicine (analgesic) taken to treat the original (primary) headache has worn off. Any type of primary headache can return as a rebound headache if a person regularly takes analgesics more than three times a week to treat it. The types of primary headaches that are commonly associated with rebound headaches include:  Migraines.  Headaches that arise from tense muscles in the head and neck area (tension headaches).  Headaches that develop and happen again (recur) on one side of the head and around the eye (cluster headaches). If rebound headaches continue, they become chronic daily headaches. What are the causes? This condition may be caused by frequent use of:  Over-the-counter medicines such as aspirin, ibuprofen, and acetaminophen.  Sinus relief medicines and other medicines that contain caffeine.  Narcotic pain medicines such as codeine and oxycodone. What are the signs or symptoms? The symptoms of a rebound headache are the same as the symptoms of the original headache. Some of the symptoms of specific types of headaches include: Migraine headache  Pulsing or throbbing pain on one or both sides of the head.  Severe pain that interferes with daily activities.  Pain that is worsened by physical activity.  Nausea, vomiting, or both.  Pain with exposure to bright light, loud noises, or strong smells.  General sensitivity to bright light, loud noises, or strong smells.  Visual  changes.  Numbness of one or both arms. Tension headache  Pressure around the head.  Dull, aching head pain.  Pain felt over the front and sides of the head.  Tenderness in the muscles of the head, neck, and shoulders. Cluster headache  Severe pain that begins in or around one eye or temple.  Redness and tearing in the eye on the same side as the pain.  Droopy or swollen eyelid.  One-sided head pain.  Nausea.  Runny nose.  Sweaty, pale facial skin.  Restlessness. How is this diagnosed? This condition is diagnosed by:  Reviewing your medical history. This includes the nature of your primary headaches.  Reviewing the types of pain medicines that you have been using to treat your headaches and how often you take them. How is this treated? This condition may be treated or managed by:  Discontinuing frequent use of the analgesic medicine. Doing this may worsen your headaches at first, but the pain should eventually become more manageable, less frequent, and less severe.  Seeing a headache specialist. He or she may be able to help you manage your headaches and help make sure there is not another cause of the headaches.  Using methods of stress relief, such as acupuncture, counseling, biofeedback, and massage. Talk with your health care provider about which methods might be good for you. Follow these instructions at home:  Take over-the-counter and prescription medicines only as told by your health care provider.  Stop the repeated use of pain medicine as told by your health care provider. Stopping can be difficult. Carefully follow instructions from your health care provider.  Avoid triggers that  are known to cause your primary headaches.  Keep all follow-up visits as told by your health care provider. This is important. Contact a health care provider if:  You continue to experience headaches after following treatments that your health care provider recommended. Get  help right away if:  You develop new headache pain.  You develop headache pain that is different than what you have experienced in the past.  You develop numbness or tingling in your arms or legs.  You develop changes in your speech or vision. This information is not intended to replace advice given to you by your health care provider. Make sure you discuss any questions you have with your health care provider. Document Released: 08/12/2003 Document Revised: 12/10/2015 Document Reviewed: 10/25/2015 Elsevier Interactive Patient Education  2017 [employer]

## 2016-04-18 NOTE — Progress Notes (Signed)
   CC: new patient, headaches  HPI PMH: Hx of 4 miscarriages. [facility] and [facility]. Not a confirmed diagnosis of antiphospholipid syndrome. Used clomid to conceive and heparin SQ during second pregnancy. Last was surprise pregnancy. Used leftover heraprin SQ for a few weeks for last pregnancy.  Had abnormal PAP - patient can't recall the details, q6 mo testing for 316m after this. Most recents normal with WOC.    Headaches since 13, been to neuro and headache clinics, had MRI as a teenager. Has tension, stress, and migraine types. Last saw neuro a long time ago. Hormonal headaches since last birth. Gets them last few days of menses, qday x 1 week after. Takes ibuprofen for headaches - q4-6 hrs. Takes it probably 5-6 days per week. Drinks 1 cup coffee in am. Sleep - 6-8 hrs per night. Triggers for headaches of all kinds include stress. Has done diet changes, no effect. Has also tried triptans without help. Naproxen did not help. OCPs reportedly caused additional headaches.   Sporadic pain in L occiput x 65mo. No injury. Feels like burning pressure. Off and on, worsened by stress. No known triggers.   Mom had a stroke in June. Stress over this.   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 Homeschools her oldest, so all 3 kids are at home at all times.   Had fluid taken out of her lapband a few months ago because she felt like it was tighter due to stress - sees [facility].    CC, SH/smoking status, and VS noted  Objective: BP (!) 112/48   Pulse 62   Temp 98.6 F (37 C) (Oral)   Ht 5\' 5"  (1.651 m)   Wt 200 lb (90.7 kg)   LMP 03/28/2016   BMI 33.28 kg/m  Gen: NAD, alert, cooperative. HEENT: NCAT, EOMI. No maxillary tenderness. No tenderness over L occiput. No rashes, lesions, or sores.  CV: RRR, no murmur Resp: CTAB, no wheezes, non-labored Abd: SNTND, BS present, no guarding or organomegaly Ext: No edema, warm Neuro: Alert and oriented, Speech  clear, No gross deficits. Strength, sensation symmetric and intact over face, all extremities.   Assessment and plan:  Headache Endorses history of tension, migraine, and hormonal headaches. Has tried multiple OTCs and OCPs in the past. Neuro exam normal. Explained rebound headaches, asked patient to keep headache diary, and RTC in 1 month. Will consider referral to neurology vs additional prophylactic meds like TCAs vs propranolol. Of note, patient endorses that all of these are worsened by stress. When questioned about self-care and time for herself, she scoffs. Refused behavioral health warm handoff.   L occiput pain: Not present today. Likely a component of tension headaches, as patient mentions that this is worsened by stress. No rash or lesion, so decreased concern for shingles or overlying skin condition.   Health maintenance:  PAP on q3 year, normal 09/2015 Declined flu shot.  Recommended exercise and self care.   Tiffany MuseKate Breydon Senters, MD, PGY1 04/18/2016 11:53 AM

## 2016-05-23 ENCOUNTER — Ambulatory Visit: Payer: Medicaid Other | Admitting: Family Medicine

## 2016-12-04 ENCOUNTER — Ambulatory Visit: Payer: Medicaid Other | Admitting: Internal Medicine

## 2016-12-05 ENCOUNTER — Ambulatory Visit (INDEPENDENT_AMBULATORY_CARE_PROVIDER_SITE_OTHER): Payer: Medicaid Other | Admitting: Internal Medicine

## 2016-12-05 VITALS — BP 110/60 | Temp 98.0°F | Wt 206.4 lb

## 2016-12-05 DIAGNOSIS — R6889 Other general symptoms and signs: Secondary | ICD-10-CM | POA: Insufficient documentation

## 2016-12-05 NOTE — Assessment & Plan Note (Signed)
Mental testing does not indicate any degree of cognitive. Neuro exam is benign. Low suspicion for organic or degenerative process given normal cognitive testing, lack of focal neurological findings, and young age. Will check labs to ensure no abnormalities contributing. Patient does appear quite anxious and distressed about a variety of things during office visit today. Suspect she may be "worried well". Did discuss possibility of neuro-psychology evaluation. Patient elected to await lab results and consider further. Would recommend referral to Dr. W[facility]ailer-[facility]ath at [facility] if patient decides she wishes to be seen by specialist. Follow up with PCP prn.  - RPR - HIV antibody (with reflex) - Folate - Vitamin B12 - Comprehensive metabolic panel - CBC - TSH

## 2016-12-05 NOTE — Progress Notes (Signed)
   Subjective:    Tiffany BinningMary E Ayers - 41 y.o. female MRN [id number]  Date of birth: 04/08/1976  HPI  Tiffany BinningMary E Ayers is here for forgetfullness.  Forgetfulness: Patient presenting for concerns about memory. Reports that since birth of her daughters four and five years ago that she has had trouble with short term memory. When her children were very young she attributed this to the fact that she was only sleeping about 2-3 hours at a time due to waking up for feeds. Now that her children are older and she is sleeping at least 6-8 hours per night, she feels that she can no longer attribute these symptoms to sleep deprivation or distractions of caring for infants. She reports that she has problems remembering tasks she needs to complete. Finds that she needs to leave reminders for herself. Shows me that she has such a reminder on a post-it note on back of her phone telling her to call her husband at 10 am and a reminder of what she needs to discuss. She does not forget where she is going, names of close friends/relatives, or details of who she is/where she is. She has no problems with long term memory reporting that she remembers clearly events such as first date with her husband. 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 She has never seen a doctor for this. Reports history of migraines. Denies history of head trauma. No focal weakness or slurring of speech.   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 She has started taking a Probiotic and feels this has helped. Wants to know if I  "think this is crazy".    -  reports that she has never smoked. She has never used smokeless tobacco. - Review of Systems: Per HPI. - Past Medical History: Patient Active Problem List   Diagnosis Date Noted  . Headache 04/18/2016  . 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 Strength 5/5 in all extremities. Sensation to extremities grossly intact. Gait normal. Speech clear and normal.   MMSE Score of 30      Assessment & Plan:   1. Forgetfulness Mental testing does not indicate any degree of cognitive. Neuro exam is benign. Low suspicion for organic or degenerative process given normal cognitive testing, lack of focal neurological findings, and young age. Will check labs to ensure no abnormalities contributing. Patient does appear quite anxious and distressed about a variety of things during office visit today. Suspect she may be "worried well". Did discuss possibility of neuro-psychology evaluation. Patient elected to await lab results and consider further. Would recommend referral to [facility]. [facility] at [facility] if patient decides she wishes to be seen by specialist. Follow up with PCP prn.  - RPR - HIV antibody (with reflex) - Folate - Vitamin B12 - Comprehensive metabolic panel - CBC - TSH   Marcy Sirenatherine Brenson Hartman, D.O. 12/05/2016, 5:09 PM PGY-3, [facility]

## 2016-12-05 NOTE — Patient Instructions (Signed)
We will call you with your lab results. You can think about if you would like a referral placed to Neuro-pyschology for further evaluation. Please follow up with your PCP Dr. Chanetta Marshallimberlake.

## 2016-12-06 LAB — COMPREHENSIVE METABOLIC PANEL
ALT: 15 IU/L (ref 0–32)
AST: 16 IU/L (ref 0–40)
Albumin/Globulin Ratio: 1.8 (ref 1.2–2.2)
Albumin: 3.9 g/dL (ref 3.5–5.5)
Alkaline Phosphatase: 54 IU/L (ref 39–117)
BUN/Creatinine Ratio: 18 (ref 9–23)
BUN: 12 mg/dL (ref 6–20)
Bilirubin Total: 0.4 mg/dL (ref 0.0–1.2)
CO2: 24 mmol/L (ref 20–29)
Calcium: 8.8 mg/dL (ref 8.7–10.2)
Chloride: 105 mmol/L (ref 96–106)
Creatinine, Ser: 0.65 mg/dL (ref 0.57–1.00)
GFR calc Af Amer: 129 mL/min/{1.73_m2} (ref >59)
GFR calc non Af Amer: 112 mL/min/{1.73_m2} (ref >59)
Globulin, Total: 2.2 g/dL (ref 1.5–4.5)
Glucose: 96 mg/dL (ref 65–99)
Potassium: 4.1 mmol/L (ref 3.5–5.2)
Sodium: 142 mmol/L (ref 134–144)
Total Protein: 6.1 g/dL (ref 6.0–8.5)

## 2016-12-06 LAB — CBC
Hematocrit: 38.5 % (ref 34.0–46.6)
Hemoglobin: 13 g/dL (ref 11.1–15.9)
MCH: 28.6 pg (ref 26.6–33.0)
MCHC: 33.8 g/dL (ref 31.5–35.7)
MCV: 85 fL (ref 79–97)
Platelets: 248 10*3/uL (ref 150–379)
RBC: 4.54 x10E6/uL (ref 3.77–5.28)
RDW: 13.9 % (ref 12.3–15.4)
WBC: 6.2 10*3/uL (ref 3.4–10.8)

## 2016-12-06 LAB — VITAMIN B12: Vitamin B-12: 291 pg/mL (ref 232–1245)

## 2016-12-06 LAB — HIV ANTIBODY (ROUTINE TESTING W REFLEX): HIV Screen 4th Generation wRfx: NONREACTIVE

## 2016-12-06 LAB — TSH: TSH: 2.26 u[IU]/mL (ref 0.450–4.500)

## 2016-12-06 LAB — RPR: RPR Ser Ql: NONREACTIVE

## 2016-12-06 LAB — FOLATE: Folate: 5.4 ng/mL (ref >3.0)

## 2016-12-08 ENCOUNTER — Telehealth: Payer: Self-pay

## 2016-12-08 NOTE — Telephone Encounter (Signed)
-----   Message from Arvilla Marketatherine Lauren Wallace, DO sent at 12/07/2016  9:24 PM EDT ----- Please call patient to let her know that all of her labs were normal. She should follow up with her PCP.   Marcy Sirenatherine Wallace, D.O. 12/07/2016, 9:24 PM PGY-3, [facility]

## 2016-12-08 NOTE — Telephone Encounter (Signed)
Pt informed. Pt would like a neuro-psychology evaluation.  Please advise. Tiffany Ayers, CMA

## 2016-12-11 ENCOUNTER — Telehealth: Payer: Self-pay | Admitting: Internal Medicine

## 2016-12-11 DIAGNOSIS — R6889 Other general symptoms and signs: Secondary | ICD-10-CM

## 2016-12-11 NOTE — Telephone Encounter (Signed)
Have placed referral for neuropsychology evaluation.   Marcy Sirenatherine Kimya Mccahill, D.O. 12/11/2016, 9:19 AM PGY-3, [facility]

## 2017-08-29 ENCOUNTER — Encounter (HOSPITAL_COMMUNITY): Payer: Self-pay

## 2018-02-04 ENCOUNTER — Encounter (HOSPITAL_COMMUNITY): Payer: Self-pay | Admitting: Emergency Medicine

## 2018-02-04 ENCOUNTER — Other Ambulatory Visit: Payer: Self-pay

## 2018-02-04 ENCOUNTER — Ambulatory Visit (HOSPITAL_COMMUNITY)
Admission: EM | Admit: 2018-02-04 | Discharge: 2018-02-04 | Disposition: A | Discharge status: Home / Self Care | Payer: Self-pay | Attending: Family Medicine | Admitting: Family Medicine

## 2018-02-04 DIAGNOSIS — R300 Dysuria: Secondary | ICD-10-CM | POA: Insufficient documentation

## 2018-02-04 DIAGNOSIS — R319 Hematuria, unspecified: Secondary | ICD-10-CM | POA: Insufficient documentation

## 2018-02-04 DIAGNOSIS — D6861 Antiphospholipid syndrome: Secondary | ICD-10-CM | POA: Insufficient documentation

## 2018-02-04 DIAGNOSIS — Z8279 Family history of other congenital malformations, deformations and chromosomal abnormalities: Secondary | ICD-10-CM | POA: Insufficient documentation

## 2018-02-04 DIAGNOSIS — R350 Frequency of micturition: Secondary | ICD-10-CM | POA: Insufficient documentation

## 2018-02-04 DIAGNOSIS — Z3202 Encounter for pregnancy test, result negative: Secondary | ICD-10-CM

## 2018-02-04 DIAGNOSIS — M545 Low back pain: Secondary | ICD-10-CM | POA: Insufficient documentation

## 2018-02-04 DIAGNOSIS — Z8249 Family history of ischemic heart disease and other diseases of the circulatory system: Secondary | ICD-10-CM | POA: Insufficient documentation

## 2018-02-04 DIAGNOSIS — Z9889 Other specified postprocedural states: Secondary | ICD-10-CM | POA: Insufficient documentation

## 2018-02-04 DIAGNOSIS — Z833 Family history of diabetes mellitus: Secondary | ICD-10-CM | POA: Insufficient documentation

## 2018-02-04 LAB — POCT URINALYSIS DIP (DEVICE)
Bilirubin Urine: NEGATIVE
Glucose, UA: NEGATIVE mg/dL
Hgb urine dipstick: NEGATIVE
Ketones, ur: NEGATIVE mg/dL
Leukocytes, UA: NEGATIVE
Nitrite: NEGATIVE
Protein, ur: NEGATIVE mg/dL
Specific Gravity, Urine: 1.02 (ref 1.005–1.030)
Urobilinogen, UA: 0.2 mg/dL (ref 0.0–1.0)
pH: 6 (ref 5.0–8.0)

## 2018-02-04 MED ORDER — NITROFURANTOIN MONOHYD MACRO 100 MG PO CAPS: 100.0000 mg | ORAL_CAPSULE | Freq: Two times a day (BID) | ORAL | 0 refills | Status: DC

## 2018-02-04 NOTE — Discharge Instructions (Addendum)
It was nice meeting you!!  Your urine did not show any signs of infection. We will have you monitor your symptoms and increase your fluid intake. We will send your urine for culture. If your symptoms are not better over the next 2 to 3 days I will give you a printed prescription and you can go ahead and start that.  Be aware that sugary drinks can be bladder irritants. Try to avoid those. This could also have been a kidney stone that passed or dehydration.  Follow up as needed for continued or worsening symptoms

## 2018-02-04 NOTE — ED Notes (Signed)
Bed: UC01 Expected date: 02/04/18 Expected time:  Means of arrival:  Comments: For APPTS

## 2018-02-04 NOTE — ED Triage Notes (Signed)
Lower back pain for two weeks.  Noted blood in urine yesterday.  Patient has burning urination and frequent urination

## 2018-02-04 NOTE — ED Provider Notes (Signed)
[facility]    CSN: [id number] Arrival date & time: 02/04/18  1149     History   Chief Complaint Chief Complaint  Patient presents with  . Appointment    11:57  . Urinary Tract Infection    HPI Tiffany Ayers is a 42 y.o. female.   Patient is a 42 year old female that presents for 2 weeks of lower back pain and 3 days of urinary frequency, dysuria, hematuria.  She describes the back pain is a dull ache similar to menstrual pain.  The pain has been constant and nagging.  She has been taking ibuprofen with minimal relief.  She was concerned that it was a urinary infection when she started to have the urinary symptoms.  She does have a history of UTIs. Denies any fever, chills, body aches, night sweats.  She denies any vaginal bleeding, discharge, itching, irritation.  Last menstrual period was 01/11/2018 and normal.  He has had her tubes tied. She did recently get back from a vacation to [place] and reports did not drink a lot of water while she was there.  She started increasing her water when she started having the urinary symptoms.  ROS per HPI      Past Medical History:  Diagnosis Date  . Anti-phospholipid antibody syndrome (HCC)   . Medical history non-contributory   . 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 S/P cesarean section 08/21/2011    Patient Active Problem List   Diagnosis Date Noted  . Forgetfulness 12/05/2016  . Headache 04/18/2016  . 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 CESAREAN SECTION  2004  . CESAREAN SECTION  08/21/2011   Procedure: CESAREAN SECTION;  Surgeon: Sherron Monday, MD;  Location: [facility];  Service: Gynecology;  Laterality: N/A;  . CESAREAN SECTION WITH BILATERAL TUBAL LIGATION Bilateral 02/01/2013   Procedure: REPEAT CESAREAN SECTION WITH BILATERAL TUBAL LIGATION;  Surgeon: Catalina Antigua, MD;  Location: [facility];  Service:  Obstetrics;  Laterality: Bilateral;  . KNEE ARTHROSCOPY  06/05/1998  . LAPAROSCOPIC GASTRIC BANDING    . TONSILLECTOMY    . TONSILLECTOMY      OB History    Gravida  7   Para  4   Term  4   Preterm      AB  3   Living  4     SAB  3   TAB      Ectopic      Multiple      Live Births  4            Home Medications    Prior to Admission medications   Medication Sig Start Date End Date Taking? Authorizing Provider  nitrofurantoin, macrocrystal-monohydrate, (MACROBID) 100 MG capsule Take 1 capsule (100 mg total) by mouth 2 (two) times daily. 02/04/18   Janace Aris, NP    Family History Family History  Problem Relation Age of Onset  . Diabetes Maternal Grandmother   . Heart disease Maternal Grandmother   . Cleft lip Sister   . Heart disease Maternal Grandfather     Social History Social History   Tobacco Use  . Smoking status: Never Smoker  . Smokeless tobacco: Never Used  Substance Use Topics  . Alcohol use: No    Comment: occasional  . Drug use: No     Allergies   Vicodin [hydrocodone-acetaminophen]   Review of Systems Review of Systems  Physical Exam Triage Vital Signs ED Triage Vitals  Enc Vitals Group     BP 02/04/18 1207 122/81     Pulse Rate 02/04/18 1207 70     Resp 02/04/18 1207 18     Temp 02/04/18 1207 98.4 F (36.9 C)     Temp Source 02/04/18 1207 Oral     SpO2 02/04/18 1207 99 %     Weight --      Height --      Head Circumference --      Peak Flow --      Pain Score 02/04/18 1203 4     Pain Loc --      Pain Edu? --      Excl. in GC? --    No data found.  Updated Vital Signs BP 122/81 (BP Location: Right Arm)   Pulse 70   Temp 98.4 F (36.9 C) (Oral)   Resp 18   LMP 01/11/2018   SpO2 99%   Visual Acuity Right Eye Distance:   Left Eye Distance:   Bilateral Distance:    Right Eye Near:   Left Eye Near:    Bilateral Near:     Physical Exam  Constitutional: She is oriented to person, place, and time.  She appears well-developed and well-nourished.  Nontoxic or ill-appearing  HENT:  Nose: Nose normal.  Eyes: Conjunctivae are normal.  Neck: Normal range of motion.  Pulmonary/Chest: Effort normal.  Abdominal: Soft. She exhibits no distension and no mass. There is no tenderness. There is no rebound and no guarding. No hernia.  Abdomen soft non tender.  No CVA tenderness  Musculoskeletal: Normal range of motion. She exhibits no edema, tenderness or deformity.  Neurological: She is alert and oriented to person, place, and time.  Skin: Skin is warm and dry.  Psychiatric: She has a normal mood and affect.  Nursing note and vitals reviewed.    UC Treatments / Results  Labs (all labs ordered are listed, but only abnormal results are displayed) Labs Reviewed  POCT URINALYSIS DIP (DEVICE)    EKG None  Radiology No results found.  Procedures Procedures (including critical care time)  Medications Ordered in UC Medications - No data to display  Initial Impression / Assessment and Plan / UC Course  I have reviewed the triage vital signs and the nursing notes.  Pertinent labs & imaging results that were available during my care of the patient were reviewed by me and considered in my medical decision making (see chart for details).     Urine negative for infection or pregnancy. Differential diagnosis to include kidney stone, dehydration, cystitis. We will go ahead and send urine for culture. Instructed to take Aleve twice daily to see if this helps. Increase water intake. If she is still having the urinary symptoms in the next couple days she can go ahead and fill the Macrobid prescription that was handed to her. Patient understanding and agreeable to plan. Final Clinical Impressions(s) / UC Diagnoses   Final diagnoses:  Dysuria     Discharge Instructions     It was nice meeting you!!  Your urine did not show any signs of infection. We will have you monitor your symptoms  and increase your fluid intake. We will send your urine for culture. If your symptoms are not better over the next 2 to 3 days I will give you a printed prescription and you can go ahead and start that.  Be aware that sugary drinks can  be bladder irritants. Try to avoid those. This could also have been a kidney stone that passed or dehydration.  Follow up as needed for continued or worsening symptoms     ED Prescriptions    Medication Sig Dispense Auth. Provider   nitrofurantoin, macrocrystal-monohydrate, (MACROBID) 100 MG capsule Take 1 capsule (100 mg total) by mouth 2 (two) times daily. 10 capsule Dahlia Byes A, NP     Controlled Substance Prescriptions [place] Controlled Substance Registry consulted? Not Applicable   Janace Aris, NP 02/04/18 1344

## 2018-02-05 LAB — URINE CULTURE: Culture: <10000 — AB

## 2018-07-05 ENCOUNTER — Ambulatory Visit (INDEPENDENT_AMBULATORY_CARE_PROVIDER_SITE_OTHER): Payer: Self-pay | Admitting: Family Medicine

## 2018-07-05 ENCOUNTER — Other Ambulatory Visit: Payer: Self-pay

## 2018-07-05 VITALS — BP 108/74 | HR 68 | Temp 98.2°F | Ht 65.0 in | Wt 222.0 lb

## 2018-07-05 DIAGNOSIS — M5441 Lumbago with sciatica, right side: Secondary | ICD-10-CM

## 2018-07-05 DIAGNOSIS — G8929 Other chronic pain: Secondary | ICD-10-CM

## 2018-07-05 MED ORDER — MELOXICAM 15 MG PO TABS: 15.0000 mg | ORAL_TABLET | Freq: Every day | ORAL | 1 refills | Status: DC

## 2018-07-05 MED ORDER — METHOCARBAMOL 500 MG PO TABS: 500.0000 mg | ORAL_TABLET | Freq: Four times a day (QID) | ORAL | 0 refills | Status: AC

## 2018-07-05 NOTE — Progress Notes (Signed)
Acute Office Visit  Subjective:    Patient ID: Tiffany Ayers, female    DOB: [birth year], 43 y.o.   MRN: [id number]  Chief Complaint  Patient presents with  . left hip pain    Patient denies trauma. Pain has been present for 4 months. It is not worsening. It is now associated with left hip numbness.   Back Pain  This is a chronic problem. Episode onset: 4 months. The problem occurs constantly. The problem is unchanged. The pain is present in the lumbar spine and gluteal. The quality of the pain is described as aching. The pain radiates to the left knee. The pain is at a severity of 7/10. The pain is moderate. The pain is worse during the day. The symptoms are aggravated by bending, position, standing and sitting. Stiffness is present in the morning. Pertinent negatives include no abdominal pain, bladder incontinence, bowel incontinence, chest pain, dysuria, fever, headaches, leg pain, numbness, paresis, paresthesias, pelvic pain, perianal numbness, tingling, weakness or weight loss. (Leg numbness ) Risk factors include poor posture, sedentary lifestyle and obesity. She has tried analgesics, ice and NSAIDs for the symptoms. The treatment provided mild relief.     Past Medical History:  Diagnosis Date  . Anti-phospholipid antibody syndrome (HCC)   . Medical history non-contributory   . 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 S/P cesarean section 08/21/2011    Past Surgical History:  Procedure Laterality Date  . CESAREAN SECTION  2004  . CESAREAN SECTION  08/21/2011   Procedure: CESAREAN SECTION;  Surgeon: Sherron Monday, MD;  Location: [facility];  Service: Gynecology;  Laterality: N/A;  . CESAREAN SECTION WITH BILATERAL TUBAL LIGATION Bilateral 02/01/2013   Procedure: REPEAT CESAREAN SECTION WITH BILATERAL TUBAL LIGATION;  Surgeon: Catalina Antigua, MD;  Location: [facility];  Service: Obstetrics;  Laterality: Bilateral;  . KNEE ARTHROSCOPY  06/05/1998  . LAPAROSCOPIC GASTRIC BANDING    .  TONSILLECTOMY    . TONSILLECTOMY      Family History  Problem Relation Age of Onset  . Diabetes Maternal Grandmother   . Heart disease Maternal Grandmother   . Cleft lip Sister   . Heart disease Maternal Grandfather     Social History   Socioeconomic History  . Marital status: Married    Spouse name: Not on file  . Number of children: Not on file  . Years of education: Not on file  . Highest education level: Not on file  Occupational History  . Not on file  Social Needs  . Financial resource strain: Not on file  . Food insecurity:    Worry: Not on file    Inability: Not on file  . Transportation needs:    Medical: Not on file    Non-medical: Not on file  Tobacco Use  . Smoking status: Never Smoker  . Smokeless tobacco: Never Used  Substance and Sexual Activity  . Alcohol use: No    Comment: occasional  . Drug use: No  . Sexual activity: Yes    Partners: Male    Birth control/protection: None  Lifestyle  . Physical activity:    Days per week: Not on file    Minutes per session: Not on file  . Stress: Not on file  Relationships  . Social connections:    Talks on phone: Not on file    Gets together: Not on file    Attends religious service: Not on file    Active member of club or organization: Not on  file    Attends meetings of clubs or organizations: Not on file    Relationship status: Not on file  . Intimate partner violence:    Fear of current or ex partner: Not on file    Emotionally abused: Not on file    Physically abused: Not on file    Forced sexual activity: Not on file  Other Topics Concern  . Not on file  Social History Narrative  . Not on file    Outpatient Medications Prior to Visit  Medication Sig Dispense Refill  . nitrofurantoin, macrocrystal-monohydrate, (MACROBID) 100 MG capsule Take 1 capsule (100 mg total) by mouth 2 (two) times daily. (Patient not taking: Reported on 07/05/2018) 10 capsule 0   No facility-administered medications  prior to visit.     Allergies  Allergen Reactions  . Vicodin [Hydrocodone-Acetaminophen] Itching and Nausea Only    Review of Systems  Constitutional: Negative for fever and weight loss.  Cardiovascular: Negative for chest pain.  Gastrointestinal: Negative for abdominal pain and bowel incontinence.  Genitourinary: Negative for bladder incontinence, dysuria and pelvic pain.  Musculoskeletal: Positive for back pain.  Neurological: Negative for tingling, weakness, numbness, headaches and paresthesias.       Objective:    Physical Exam  Constitutional: She appears well-developed and well-nourished.  HENT:  Head: Normocephalic and atraumatic.  Eyes: Conjunctivae are normal. No scleral icterus.  Cardiovascular: Normal rate and regular rhythm.  Pulmonary/Chest: Effort normal and breath sounds normal.  Abdominal: Soft. She exhibits no distension. There is no abdominal tenderness. There is no rebound.  Musculoskeletal: Normal range of motion.     Lumbar back: She exhibits tenderness. She exhibits no bony tenderness, no swelling, no edema, no deformity, no pain and no spasm.       Legs:     Comments: Patient has 5 of 5 strength in all lower extremities bilaterally.  There is no bruising or effusion noted.  Patient has normal range of motion in hip external and internal rotation, flexion extension, abduction and abduction.  There is no pain with hyperextension of the back.  Negative straight leg raise.  Skin: Skin is warm and dry.  Psychiatric: She has a normal mood and affect. Her behavior is normal.    BP 108/74 (BP Location: Right Arm, Patient Position: Sitting, Cuff Size: Large)   Pulse 68   Temp 98.2 F (36.8 C) (Oral)   Ht 5\' 5"  (1.651 m)   Wt 222 lb (100.7 kg)   SpO2 99%   BMI 36.94 kg/m  Wt Readings from Last 3 Encounters:  07/05/18 222 lb (100.7 kg)  12/05/16 206 lb 6.4 oz (93.6 kg)  04/18/16 200 lb (90.7 kg)    There are no preventive care reminders to display for  this patient.  There are no preventive care reminders to display for this patient.   Lab Results  Component Value Date   TSH 2.260 12/05/2016   Lab Results  Component Value Date   WBC 6.2 12/05/2016   HGB 13.0 12/05/2016   HCT 38.5 12/05/2016   MCV 85 12/05/2016   PLT 248 12/05/2016   Lab Results  Component Value Date   NA 142 12/05/2016   K 4.1 12/05/2016   CO2 24 12/05/2016   GLUCOSE 96 12/05/2016   BUN 12 12/05/2016   CREATININE 0.65 12/05/2016   BILITOT 0.4 12/05/2016   ALKPHOS 54 12/05/2016   AST 16 12/05/2016   ALT 15 12/05/2016   PROT 6.1 12/05/2016   ALBUMIN 3.9  12/05/2016   CALCIUM 8.8 12/05/2016   ANIONGAP 12 05/17/2014   No results found for: CHOL No results found for: HDL No results found for: LDLCALC No results found for: TRIG No results found for: CHOLHDL No results found for: [id number]     Assessment & Plan:   Problem List Items Addressed This Visit      Nervous and Auditory   Chronic right-sided low back pain with right-sided sciatica - Primary    Patient presenting with chronic lumbago with new onset numbness.  Likely radicular given the sciatic component and new onset numbness.  Patient has normal strength and sensation throughout lower extremities, so this is reassuring.  No red flag symptoms such as loss of bowel or bladder function.  Will treat with prescription NSAIDs and muscle relaxants.  Patient to follow-up in 2 weeks if not improving.      Relevant Medications   meloxicam (MOBIC) 15 MG tablet   methocarbamol (ROBAXIN) 500 MG tablet       Meds ordered this encounter  Medications  . meloxicam (MOBIC) 15 MG tablet    Sig: Take 1 tablet (15 mg total) by mouth daily.    Dispense:  30 tablet    Refill:  1  . methocarbamol (ROBAXIN) 500 MG tablet    Sig: Take 1 tablet (500 mg total) by mouth 4 (four) times daily for 14 days.    Dispense:  56 tablet    Refill:  0     Garnette GunnerAaron B Thompson, MD

## 2018-07-05 NOTE — Assessment & Plan Note (Signed)
Patient presenting with chronic lumbago with new onset numbness.  Likely radicular given the sciatic component and new onset numbness.  Patient has normal strength and sensation throughout lower extremities, so this is reassuring.  No red flag symptoms such as loss of bowel or bladder function.  Will treat with prescription NSAIDs and muscle relaxants.  Patient to follow-up in 2 weeks if not improving.

## 2018-07-05 NOTE — Patient Instructions (Addendum)
Back Pain Acute back pain is sudden and usually short-lived. It is often caused by an injury to the muscles and tissues in the back. The injury may result from:  A muscle or ligament getting overstretched or torn (strained). Ligaments are tissues that connect bones to each other. Lifting something improperly can cause a back strain.  Wear and tear (degeneration) of the spinal disks. Spinal disks are circular tissue that provides cushioning between the bones of the spine (vertebrae).  Twisting motions, such as while playing sports or doing yard work.  A hit to the back.  Arthritis. You may have a physical exam, lab tests, and imaging tests to find the cause of your pain. Acute back pain usually goes away with rest and home care. Follow these instructions at home: Managing pain, stiffness, and swelling  Take over-the-counter and prescription medicines only as told by your health care provider.  Your health care provider may recommend applying ice during the first 24-48 hours after your pain starts. To do this: ? Put ice in a plastic bag. ? Place a towel between your skin and the bag. ? Leave the ice on for 20 minutes, 2-3 times a day.  If directed, apply heat to the affected area as often as told by your health care provider. Use the heat source that your health care provider recommends, such as a moist heat pack or a heating pad. ? Place a towel between your skin and the heat source. ? Leave the heat on for 20-30 minutes. ? Remove the heat if your skin turns bright red. This is especially important if you are unable to feel pain, heat, or cold. You have a greater risk of getting burned. Activity   Do not stay in bed. Staying in bed for more than 1-2 days can delay your recovery.  Sit up and stand up straight. Avoid leaning forward when you sit, or hunching over when you stand. ? If you work at a desk, sit close to it so you do not need to lean over. Keep your chin tucked in. Keep  your neck drawn back, and keep your elbows bent at a right angle. Your arms should look like the letter "L." ? Sit high and close to the steering wheel when you drive. Add lower back (lumbar) support to your car seat, if needed.  Take short walks on even surfaces as soon as you are able. Try to increase the length of time you walk each day.  Do not sit, drive, or stand in one place for more than 30 minutes at a time. Sitting or standing for long periods of time can put stress on your back.  Do not drive or use heavy machinery while taking prescription pain medicine.  Use proper lifting techniques. When you bend and lift, use positions that put less stress on your back: ? [place] your knees. ? Keep the load close to your body. ? Avoid twisting.  Exercise regularly as told by your health care provider. Exercising helps your back heal faster and helps prevent back injuries by keeping muscles strong and flexible.  Work with a physical therapist to make a safe exercise program, as recommended by your health care provider. Do any exercises as told by your physical therapist. Lifestyle  Maintain a healthy weight. Extra weight puts stress on your back and makes it difficult to have good posture.  Avoid activities or situations that make you feel anxious or stressed. Stress and anxiety increase muscle tension  and can make back pain worse. Learn ways to manage anxiety and stress, such as through exercise. General instructions  Sleep on a firm mattress in a comfortable position. Try lying on your side with your knees slightly bent. If you lie on your back, put a pillow under your knees.  Follow your treatment plan as told by your health care provider. This may include: ? Cognitive or behavioral therapy. ? Acupuncture or massage therapy. ? Meditation or yoga. Contact a health care provider if:  You have pain that is not relieved with rest or medicine.  You have increasing pain going down into your  legs or buttocks.  Your pain does not improve after 2 weeks.  You have pain at night.  You lose weight without trying.  You have a fever or chills. Get help right away if:  You develop new bowel or bladder control problems.  You have unusual weakness or numbness in your arms or legs.  You develop nausea or vomiting.  You develop abdominal pain.  You feel faint. Summary  Acute back pain is sudden and usually short-lived.  Use proper lifting techniques. When you bend and lift, use positions that put less stress on your back.  Take over-the-counter and prescription medicines and apply heat or ice as directed by your health care provider. This information is not intended to replace advice given to you by your health care provider. Make sure you discuss any questions you have with your health care provider. Document Released: 05/22/2005 Document Revised: 12/27/2017 Document Reviewed: 01/03/2017 Elsevier Interactive Patient Education  2019 [employer]

## 2018-09-28 ENCOUNTER — Encounter (HOSPITAL_COMMUNITY): Payer: Self-pay | Admitting: Emergency Medicine

## 2018-09-28 ENCOUNTER — Ambulatory Visit (HOSPITAL_COMMUNITY)
Admission: EM | Admit: 2018-09-28 | Discharge: 2018-09-28 | Disposition: A | Discharge status: Home / Self Care | Payer: Medicaid Other | Attending: Family Medicine | Admitting: Family Medicine

## 2018-09-28 DIAGNOSIS — S61213A Laceration without foreign body of left middle finger without damage to nail, initial encounter: Secondary | ICD-10-CM

## 2018-09-28 DIAGNOSIS — Y93H2 Activity, gardening and landscaping: Secondary | ICD-10-CM

## 2018-09-28 DIAGNOSIS — W269XXA Contact with unspecified sharp object(s), initial encounter: Secondary | ICD-10-CM

## 2018-09-28 NOTE — Discharge Instructions (Signed)
You can remove current dressing in 24 hours. Keep wound clean and dry. You can dress the wound with neosporin for the next 2-3 days. You can clean gently with soap and water. Do not soak area in water. Monitor for spreading redness, increased warmth, increased swelling, fever, follow up for reevaluation needed.

## 2018-09-28 NOTE — ED Provider Notes (Signed)
[facility]    CSN: [id number] Arrival date & time: 09/28/18  1147     History   Chief Complaint Chief Complaint  Patient presents with  . Finger Injury  . Laceration    HPI Tiffany Ayers is a 43 y.o. female.   43 year old female comes in for left middle finger lacerations that was sustained while trimming bushes.  She wrapped the wound with paper towel, and came in for evaluation.  She has intermittent numbness and tingling to the fingertips.  Bleeding controlled by pressure.  Denies decrease in range of motion.  Tetanus up-to-date.     Past Medical History:  Diagnosis Date  . Anti-phospholipid antibody syndrome (HCC)   . Medical history non-contributory   . 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 S/P cesarean section 08/21/2011    Patient Active Problem List   Diagnosis Date Noted  . Chronic right-sided low back pain with right-sided sciatica 07/05/2018  . Forgetfulness 12/05/2016  . Headache 04/18/2016  . 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 CESAREAN SECTION  2004  . CESAREAN SECTION  08/21/2011   Procedure: CESAREAN SECTION;  Surgeon: Sherron Monday, MD;  Location: [facility];  Service: Gynecology;  Laterality: N/A;  . CESAREAN SECTION WITH BILATERAL TUBAL LIGATION Bilateral 02/01/2013   Procedure: REPEAT CESAREAN SECTION WITH BILATERAL TUBAL LIGATION;  Surgeon: Catalina Antigua, MD;  Location: [facility];  Service: Obstetrics;  Laterality: Bilateral;  . KNEE ARTHROSCOPY  06/05/1998  . LAPAROSCOPIC GASTRIC BANDING    . TONSILLECTOMY    . TONSILLECTOMY      OB History    Gravida  7   Para  4   Term  4   Preterm      AB  3   Living  4     SAB  3   TAB      Ectopic      Multiple      Live Births  4            Home Medications    Prior to Admission medications   Medication Sig Start Date End Date Taking? Authorizing Provider   meloxicam (MOBIC) 15 MG tablet Take 1 tablet (15 mg total) by mouth daily. 07/05/18   Garnette Gunner, MD  nitrofurantoin, macrocrystal-monohydrate, (MACROBID) 100 MG capsule Take 1 capsule (100 mg total) by mouth 2 (two) times daily. Patient not taking: Reported on 07/05/2018 02/04/18   Janace Aris, NP    Family History Family History  Problem Relation Age of Onset  . Diabetes Maternal Grandmother   . Heart disease Maternal Grandmother   . Cleft lip Sister   . Heart disease Maternal Grandfather     Social History Social History   Tobacco Use  . Smoking status: Never Smoker  . Smokeless tobacco: Never Used  Substance Use Topics  . Alcohol use: No    Comment: occasional  . Drug use: No     Allergies   Vicodin [hydrocodone-acetaminophen]   Review of Systems Review of Systems  Reason unable to perform ROS: See HPI as above.     Physical Exam Triage Vital Signs ED Triage Vitals  Enc Vitals Group     BP 09/28/18 1200 (!) 141/77     Pulse Rate 09/28/18 1159 68     Resp 09/28/18 1159 16     Temp 09/28/18 1159 98.4 F (36.9 C)  Temp src --      SpO2 09/28/18 1159 98 %     Weight --      Height --      Head Circumference --      Peak Flow --      Pain Score 09/28/18 1200 3     Pain Loc --      Pain Edu? --      Excl. in GC? --    No data found.  Updated Vital Signs BP (!) 141/77   Pulse 68   Temp 98.4 F (36.9 C)   Resp 16   LMP 09/28/2018   SpO2 98%   Physical Exam Constitutional:      General: She is not in acute distress.    Appearance: She is well-developed. She is not diaphoretic.  HENT:     Head: Normocephalic and atraumatic.  Eyes:     Conjunctiva/sclera: Conjunctivae normal.     Pupils: Pupils are equal, round, and reactive to light.  Musculoskeletal:     Comments: See picture below.  3 horizontal superficial lacerations to the DIP of left middle finger.  Bleeding controlled.  Full range of motion.  Sensation intact.  Radial pulse 2+,  cap refill less than 2 seconds.  Neurological:     Mental Status: She is alert and oriented to person, place, and time.        UC Treatments / Results  Labs (all labs ordered are listed, but only abnormal results are displayed) Labs Reviewed - No data to display  EKG None  Radiology No results found.  Procedures Procedures (including critical care time)  Medications Ordered in UC Medications - No data to display  Initial Impression / Assessment and Plan / UC Course  I have reviewed the triage vital signs and the nursing notes.  Pertinent labs & imaging results that were available during my care of the patient were reviewed by me and considered in my medical decision making (see chart for details).    Discussed treatment options with patient including no repair vs dermabond.  Patient would like to avoid Dermabond due to finances.  Wound irrigated with saline, dressed with bacitracin. Wound care instructions given.  Return precautions given.  Patient expresses understanding and agrees to plan.  Final Clinical Impressions(s) / UC Diagnoses   Final diagnoses:  Laceration of left middle finger without foreign body without damage to nail, initial encounter    ED Prescriptions    None        Belinda FisherYu, Rosalee Tolley V, PA-C 09/28/18 1224

## 2018-09-28 NOTE — ED Triage Notes (Signed)
Pt states she was trimming the bushes today and accidentally trimmed her L middle finger.

## 2018-11-29 ENCOUNTER — Other Ambulatory Visit: Payer: Self-pay

## 2018-11-29 ENCOUNTER — Encounter (HOSPITAL_COMMUNITY): Payer: Self-pay

## 2018-11-29 ENCOUNTER — Ambulatory Visit (HOSPITAL_COMMUNITY)
Admission: EM | Admit: 2018-11-29 | Discharge: 2018-11-29 | Disposition: A | Discharge status: Home / Self Care | Payer: Medicaid Other | Attending: Family Medicine | Admitting: Family Medicine

## 2018-11-29 DIAGNOSIS — R1011 Right upper quadrant pain: Secondary | ICD-10-CM

## 2018-11-29 MED ORDER — TRAMADOL HCL 50 MG PO TABS: 50.0000 mg | ORAL_TABLET | Freq: Four times a day (QID) | ORAL | 0 refills | Status: DC | PRN

## 2018-11-29 NOTE — Discharge Instructions (Addendum)
I believe it is most likely that you have gallbladder disease/stones I am unable to order an ultrasound from the urgent care Follow up with your PCP Take tylenol for mild pain If your pain becomes more severe - go to the ER

## 2018-11-29 NOTE — ED Triage Notes (Signed)
Patient presents to Urgent Care with complaints of right rib/flank pain since almost a week ago. Patient reports the pain is now radiating to her back, pt has had fatigue recently and just feels "off".

## 2018-11-29 NOTE — ED Provider Notes (Signed)
[facility]    CSN: [id number] Arrival date & time: 11/29/18  1604      History   Chief Complaint Chief Complaint  Patient presents with  . Appointment    4:10  . Flank Pain    HPI Tiffany Ayers is a 43 y.o. female.   HPI  Patient has pain in her right side.  It is in her right upper quadrant.  Radiates back to the shoulder blade.  Comes and goes.  Feels like it is briefly better with eating.  No noticeable increased pain after eating or with fatty foods.  Patient has had a LAP-BAND procedure.  She eats frequent small meals with protein.  Tries not to eat very many fatty foods.  No nausea or vomiting.  No fever.  The pain is getting worse over a 1 week period of time.  No urinary symptoms hematuria.  No history of kidney stones or kidney infections.  Past Medical History:  Diagnosis Date  . Anti-phospholipid antibody syndrome (HCC)   . Medical history non-contributory   . 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 S/P cesarean section 08/21/2011    Patient Active Problem List   Diagnosis Date Noted  . Chronic right-sided low back pain with right-sided sciatica 07/05/2018  . Forgetfulness 12/05/2016  . Headache 04/18/2016  . 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 CESAREAN SECTION  2004  . CESAREAN SECTION  08/21/2011   Procedure: CESAREAN SECTION;  Surgeon: Sherron MondayJody Bovard, MD;  Location: [facility];  Service: Gynecology;  Laterality: N/A;  . CESAREAN SECTION WITH BILATERAL TUBAL LIGATION Bilateral 02/01/2013   Procedure: REPEAT CESAREAN SECTION WITH BILATERAL TUBAL LIGATION;  Surgeon: Catalina AntiguaPeggy Constant, MD;  Location: [facility];  Service: Obstetrics;  Laterality: Bilateral;  . KNEE ARTHROSCOPY  06/05/1998  . LAPAROSCOPIC GASTRIC BANDING    . TONSILLECTOMY    . TONSILLECTOMY      OB History    Gravida  7   Para  4   Term  4   Preterm      AB  3   Living  4      SAB  3   TAB      Ectopic      Multiple      Live Births  4            Home Medications    Prior to Admission medications   Not on File    Family History Family History  Problem Relation Age of Onset  . Healthy Mother   . Healthy Father   . Diabetes Maternal Grandmother   . Heart disease Maternal Grandmother   . Cleft lip Sister   . Heart disease Maternal Grandfather     Social History Social History   Tobacco Use  . Smoking status: Never Smoker  . Smokeless tobacco: Never Used  Substance Use Topics  . Alcohol use: No    Comment: occasional  . Drug use: No     Allergies   Tramadol and Vicodin [hydrocodone-acetaminophen]   Review of Systems Review of Systems  Constitutional: Negative for chills and fever.  HENT: Negative for ear pain and sore throat.   Eyes: Negative for pain and visual disturbance.  Respiratory: Negative for cough and shortness of breath.   Cardiovascular: Negative for chest pain and palpitations.  Gastrointestinal: Positive for abdominal pain. Negative for vomiting.  Genitourinary: Negative for dysuria  and hematuria.  Musculoskeletal: Negative for arthralgias and back pain.  Skin: Negative for color change and rash.  Neurological: Negative for seizures and syncope.  All other systems reviewed and are negative.    Physical Exam Triage Vital Signs ED Triage Vitals  Enc Vitals Group     BP 11/29/18 1622 132/87     Pulse Rate 11/29/18 1622 67     Resp 11/29/18 1622 16     Temp 11/29/18 1622 98.8 F (37.1 C)     Temp Source 11/29/18 1622 Oral     SpO2 11/29/18 1622 100 %     Weight --      Height --      Head Circumference --      Peak Flow --      Pain Score 11/29/18 1620 7     Pain Loc --      Pain Edu? --      Excl. in GC? --    No data found.  Updated Vital Signs BP 132/87 (BP Location: Right Arm)   Pulse 67   Temp 98.8 F (37.1 C) (Oral)   Resp 16   SpO2 100%      Physical Exam Constitutional:       General: She is not in acute distress.    Appearance: She is well-developed. She is obese.  HENT:     Head: Normocephalic and atraumatic.  Eyes:     Conjunctiva/sclera: Conjunctivae normal.     Pupils: Pupils are equal, round, and reactive to light.  Neck:     Musculoskeletal: Normal range of motion and neck supple.  Cardiovascular:     Rate and Rhythm: Normal rate and regular rhythm.     Heart sounds: Normal heart sounds.  Pulmonary:     Effort: Pulmonary effort is normal. No respiratory distress.     Breath sounds: Normal breath sounds.  Abdominal:     General: There is no distension.     Palpations: Abdomen is soft.     Tenderness: There is abdominal tenderness. There is no right CVA tenderness or left CVA tenderness.     Comments: Tenderness to deep palpation of the right upper quadrant.  Tenderness with palpation of liver edge.  Liver does not appear to be enlarged.  Musculoskeletal: Normal range of motion.        General: No tenderness.  Skin:    General: Skin is warm and dry.  Neurological:     Mental Status: She is alert.  Psychiatric:        Mood and Affect: Mood normal.        Behavior: Behavior normal.      UC Treatments / Results  Labs (all labs ordered are listed, but only abnormal results are displayed) Labs Reviewed - No data to display  EKG None  Radiology No results found.  Procedures Procedures (including critical care time)  Medications Ordered in UC Medications - No data to display  Initial Impression / Assessment and Plan / UC Course  I have reviewed the triage vital signs and the nursing notes.  Pertinent labs & imaging results that were available during my care of the patient were reviewed by me and considered in my medical decision making (see chart for details).     She has history and physical exam findings consistent with gallbladder disease.  I offered her tramadol for pain.  She states this keeps her awake.  She states Vicodin  makes her sick.  She does  not appear to be in enough discomfort for me to consider oxycodone.  In addition I do not want to hide any serious symptoms.  I told her if she gets worse instead of better over the ER.  Otherwise she needs a primary care doctor to order ultrasound right upper quadrant Final Clinical Impressions(s) / UC Diagnoses   Final diagnoses:  Abdominal pain, right upper quadrant     Discharge Instructions     I believe it is most likely that you have gallbladder disease/stones I am unable to order an ultrasound from the urgent care Follow up with your PCP Take tylenol for mild pain If your pain becomes more severe - go to the ER    ED Prescriptions    Medication Sig Dispense Auth. Provider     Controlled Substance Prescriptions [place] Controlled Substance Registry consulted? y   Raylene Everts, MD 11/29/18 2019

## 2019-06-17 ENCOUNTER — Other Ambulatory Visit: Payer: Self-pay

## 2019-06-17 ENCOUNTER — Encounter: Payer: Self-pay | Admitting: Family Medicine

## 2019-06-17 ENCOUNTER — Ambulatory Visit (INDEPENDENT_AMBULATORY_CARE_PROVIDER_SITE_OTHER): Payer: 59 | Admitting: Family Medicine

## 2019-06-17 VITALS — BP 120/72 | HR 80 | Wt 222.2 lb

## 2019-06-17 DIAGNOSIS — M7581 Other shoulder lesions, right shoulder: Secondary | ICD-10-CM | POA: Diagnosis not present

## 2019-06-17 DIAGNOSIS — M7062 Trochanteric bursitis, left hip: Secondary | ICD-10-CM

## 2019-06-17 MED ORDER — NAPROXEN 500 MG PO TABS: 500.0000 mg | ORAL_TABLET | Freq: Two times a day (BID) | ORAL | 0 refills | Status: DC

## 2019-06-17 NOTE — Progress Notes (Signed)
Subjective: Chief Complaint  Patient presents with  . Shoulder Pain     HPI: Tiffany Ayers is a 44 y.o. presenting to clinic today to discuss the following:  1 Right shoulder 6-12 months ago Generalized right shoulder pain with aches and pains, no specific location No known injury Sometimes she has to push herself up when she is sitting, and this aggravates the pain, when she first noticed it Also notices the pain when she is flexing her biceps and picking up heavy things Does not notice the pain at rest, denies neck pain or right elbow pain Pain resolves when she avoids aggravating movements No radiation Has tried heat and ibuprofen, but they haven't helped her Has full ROM per her report  2 Chronic Left Hip Pain Hx sciatica in the past Pain lateral to PSIS on Left Worse when she is sleeping or on her feet for more than 6-7 hrs 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 Chief complaint noted.  Other Pertinent PMH: Hx Gastric Banding Past Medical, Surgical, Social, and Family History Reviewed & Updated per EMR.      Social History   Tobacco Use  Smoking Status Never Smoker  Smokeless Tobacco Never Used   Smoking status noted.    Objective: BP 120/72   Pulse 80   Wt 222 lb 3.2 oz (100.8 kg)   LMP 06/16/2019 (Exact Date)   SpO2 96%   BMI 36.98 kg/m  Vitals and nursing notes reviewed  Physical  Exam:  General: 44 y.o. female in NAD Lungs: Breathing comfortably on room air Skin: warm and dry Right shoulder: Inspection reveals no obvious deformity, atrophy, or asymmetry bilaterally. No bruising. No swelling Palpation is normal with no TTP over [facility] joint or bicipital groove. Full ROM in flexion, abduction, internal/external rotation NV intact distally Normal scapular function observed. Special Tests:  - Impingement: Positive Hawkins, equivocal empty can with pain, no weakness on right.  Hawkins and empty can negative on left. - Supraspinatous: Pain with empty can on right, negative on left.  - Infraspinatous/Teres Minor: 5/5 strength with ER - Subscapularis: negative belly press. 5/5 strength with IR - Biceps tendon: Negative Speeds  - Labrum: Equivocal Obriens on right with pain, mild weakness, negative clunk, good stability bilaterally. - AC Joint: Negative cross arm - Negative apprehension test - No painful arc and no drop arm sign bilaterally Left hip:  - Inspection: No gross deformity, no swelling, erythema, or ecchymosis bilaterally - Palpation: Tenderness palpation of the left piriformis region as well as left greater trochanter.  Most of tenderness was over greater trochanter.  No tenderness palpation of right hip. - ROM: Normal range of motion on Flexion, extension, abduction, internal and external rotation - Strength: Normal strength bilaterally aside from 4/5 strength left hip abductors. - Neuro/vasc: NV intact distally - Special Tests: Negative  FABER and FADIR bilaterally.   Mild tightness with piriformis stretch.      No results found for this or any previous visit (from the past 72 hour(s)).  Assessment/Plan:  Rotator cuff tendonitis, right No evidence of obvious tear, but history and exam are consistent with rotator cuff tendinitis.  Discussed with patient that she could have a slight tear, but treatment for both at this time would be the same.  Declines  steroid injection at this time.  Will refer patient to physical therapy and Rx sent for naproxen twice daily to use for the next 30 days.  Also advised to ice at least twice a day.  Avoid provocative maneuvers.  Advised to follow-up in 1 month if no improvement.  At that time could consider imaging and corticosteroid injection.  Greater trochanteric bursitis of left hip Patient does have a history of left-sided sciatica and has some complaints in the piriformis region with occasional radiation of pain down the back of her leg, likely secondary to sciatica.  Her main problem today however seems to be left greater trochanteric bursitis, she has extreme tenderness to palpation over the left greater trochanter.  Also consistent with history of waking her up when rolling on it.  Naproxen prescribed as per above, to take twice a day for the next 30 days.  Also advised icing at least twice a day.  Order physical therapy, also suspect the patient to be weak hip abductors are contributing.  Discussed weight loss and consistent exercise regimen to help prevent further recurrence of this as well as other injuries and pain.  Advised to follow-up in 1 month if no improvement.     PATIENT EDUCATION PROVIDED: See AVS    Diagnosis and plan along with any newly prescribed medication(s) were discussed in detail with this patient today. The patient verbalized understanding and agreed with the plan. Patient advised if symptoms worsen return to clinic or ER.     Orders Placed This Encounter  Procedures  . Ambulatory referral to Physical Therapy    Referral Priority:   Routine    Referral Type:   Physical Medicine    Referral Reason:   Specialty Services Required    Requested Specialty:   Physical Therapy    Number of Visits Requested:   1    Meds ordered this encounter  Medications  . naproxen (NAPROSYN) 500 MG tablet    Sig: Take 1 tablet (500 mg total) by mouth 2 (two) times daily with a meal.    Dispense:   30 tablet    Refill:  0     [place] Constable, DO 06/18/2019, 3:04 PM PGY-2 [place]

## 2019-06-17 NOTE — Patient Instructions (Addendum)
Thank you for coming to see me today. It was a pleasure. Today we talked about:   Your shoulder pain: I think you have a partial tear or inflammation in one of your rotator cuff tendons.  I will refer you to physical therapy for this.  I will also prescribe naproxen uses twice a day.  Take it every day twice a day for the next 30 days.  He can also use ice on this, do this at least twice a day.  Devote yourself to your exercises and this should improve.  Your left hip pain: I think that you have the sciatica as well as greater trochanteric bursitis, or inflammation of the sac of fluid that is on your hip.  You also have very weak hip abductors, which can be contributing.  I will send you to physical therapy for this as well.  You can also use the naproxen for this twice a day.  Continue to work out as much as you can.  I have placed a referral to Physical Therapy.  If you do not hear from them in the next week, please give [place] a call or send me a MyChart message.  Please follow-up with me in 1 month if continue to have pain.  If you have any questions or concerns, please do not hesitate to call the office at [phone].  Best,   Luis Abed, DO

## 2019-06-18 DIAGNOSIS — M7062 Trochanteric bursitis, left hip: Secondary | ICD-10-CM | POA: Insufficient documentation

## 2019-06-18 DIAGNOSIS — M7581 Other shoulder lesions, right shoulder: Secondary | ICD-10-CM | POA: Insufficient documentation

## 2019-06-18 NOTE — Assessment & Plan Note (Signed)
Patient does have a history of left-sided sciatica and has some complaints in the piriformis region with occasional radiation of pain down the back of her leg, likely secondary to sciatica.  Her main problem today however seems to be left greater trochanteric bursitis, she has extreme tenderness to palpation over the left greater trochanter.  Also consistent with history of waking her up when rolling on it.  Naproxen prescribed as per above, to take twice a day for the next 30 days.  Also advised icing at least twice a day.  Order physical therapy, also suspect the patient to be weak hip abductors are contributing.  Discussed weight loss and consistent exercise regimen to help prevent further recurrence of this as well as other injuries and pain.  Advised to follow-up in 1 month if no improvement.

## 2019-06-18 NOTE — Assessment & Plan Note (Addendum)
No evidence of obvious tear, but history and exam are consistent with rotator cuff tendinitis.  Discussed with patient that she could have a slight tear, but treatment for both at this time would be the same.  Declines steroid injection at this time.  Will refer patient to physical therapy and Rx sent for naproxen twice daily to use for the next 30 days.  Also advised to ice at least twice a day.  Avoid provocative maneuvers.  Advised to follow-up in 1 month if no improvement.  At that time could consider imaging and corticosteroid injection.

## 2019-07-07 ENCOUNTER — Other Ambulatory Visit: Payer: Self-pay | Admitting: Surgery

## 2019-07-07 DIAGNOSIS — K9509 Other complications of gastric band procedure: Secondary | ICD-10-CM

## 2019-07-15 ENCOUNTER — Ambulatory Visit
Admission: RE | Admit: 2019-07-15 | Discharge: 2019-07-15 | Disposition: A | Discharge status: Home / Self Care | Payer: 59 | Source: Ambulatory Visit | Attending: Surgery | Admitting: Surgery

## 2019-07-15 ENCOUNTER — Other Ambulatory Visit: Payer: Self-pay | Admitting: Surgery

## 2019-07-15 DIAGNOSIS — K9509 Other complications of gastric band procedure: Secondary | ICD-10-CM

## 2019-07-18 ENCOUNTER — Ambulatory Visit (HOSPITAL_COMMUNITY)
Admission: EM | Admit: 2019-07-18 | Discharge: 2019-07-18 | Disposition: A | Discharge status: Home / Self Care | Payer: 59 | Attending: Family Medicine | Admitting: Family Medicine

## 2019-07-18 ENCOUNTER — Other Ambulatory Visit: Payer: Self-pay

## 2019-07-18 ENCOUNTER — Encounter (HOSPITAL_COMMUNITY): Payer: Self-pay

## 2019-07-18 DIAGNOSIS — R1011 Right upper quadrant pain: Secondary | ICD-10-CM | POA: Diagnosis not present

## 2019-07-18 LAB — CBC WITH DIFFERENTIAL/PLATELET
Abs Immature Granulocytes: 0.03 10*3/uL (ref 0.00–0.07)
Basophils Absolute: 0 10*3/uL (ref 0.0–0.1)
Basophils Relative: 1 %
Eosinophils Absolute: 0.1 10*3/uL (ref 0.0–0.5)
Eosinophils Relative: 1 %
HCT: 40.3 % (ref 36.0–46.0)
Hemoglobin: 13.4 g/dL (ref 12.0–15.0)
Immature Granulocytes: 0 %
Lymphocytes Relative: 27 %
Lymphs Abs: 1.8 10*3/uL (ref 0.7–4.0)
MCH: 28.9 pg (ref 26.0–34.0)
MCHC: 33.3 g/dL (ref 30.0–36.0)
MCV: 86.9 fL (ref 80.0–100.0)
Monocytes Absolute: 0.4 10*3/uL (ref 0.1–1.0)
Monocytes Relative: 5 %
Neutro Abs: 4.4 10*3/uL (ref 1.7–7.7)
Neutrophils Relative %: 66 %
Platelets: 276 10*3/uL (ref 150–400)
RBC: 4.64 MIL/uL (ref 3.87–5.11)
RDW: 12.5 % (ref 11.5–15.5)
WBC: 6.7 10*3/uL (ref 4.0–10.5)
nRBC: 0 % (ref 0.0–0.2)

## 2019-07-18 LAB — COMPREHENSIVE METABOLIC PANEL
ALT: 16 U/L (ref 0–44)
AST: 15 U/L (ref 15–41)
Albumin: 3.7 g/dL (ref 3.5–5.0)
Alkaline Phosphatase: 59 U/L (ref 38–126)
Anion gap: 10 (ref 5–15)
BUN: 9 mg/dL (ref 6–20)
CO2: 22 mmol/L (ref 22–32)
Calcium: 8.6 mg/dL — ABNORMAL LOW (ref 8.9–10.3)
Chloride: 107 mmol/L (ref 98–111)
Creatinine, Ser: 0.66 mg/dL (ref 0.44–1.00)
GFR calc Af Amer: >60 mL/min (ref >60)
GFR calc non Af Amer: >60 mL/min (ref >60)
Glucose, Bld: 94 mg/dL (ref 70–99)
Potassium: 4.1 mmol/L (ref 3.5–5.1)
Sodium: 139 mmol/L (ref 135–145)
Total Bilirubin: 0.6 mg/dL (ref 0.3–1.2)
Total Protein: 6.3 g/dL — ABNORMAL LOW (ref 6.5–8.1)

## 2019-07-18 LAB — POCT URINALYSIS DIP (DEVICE)
Bilirubin Urine: NEGATIVE
Glucose, UA: NEGATIVE mg/dL
Ketones, ur: NEGATIVE mg/dL
Leukocytes,Ua: NEGATIVE
Nitrite: NEGATIVE
Protein, ur: NEGATIVE mg/dL
Specific Gravity, Urine: 1.02 (ref 1.005–1.030)
Urobilinogen, UA: 0.2 mg/dL (ref 0.0–1.0)
pH: 6 (ref 5.0–8.0)

## 2019-07-18 LAB — LIPASE, BLOOD: Lipase: 29 U/L (ref 11–51)

## 2019-07-18 MED ORDER — KETOROLAC TROMETHAMINE 30 MG/ML IJ SOLN
INTRAMUSCULAR | Status: AC
  Filled 2019-07-18: qty 1

## 2019-07-18 MED ORDER — KETOROLAC TROMETHAMINE 30 MG/ML IJ SOLN
30.0000 mg | Freq: Once | INTRAMUSCULAR | Status: AC
  Administered 2019-07-18: 15:00:00 30 mg via INTRAMUSCULAR

## 2019-07-18 NOTE — Discharge Instructions (Signed)
I am hopeful the medication we have given you today is helpful with your immediate pain.  Low fat diet will help in prevention of flares, see provided information about gall stones and eating plan.  I will notify you this evening/tonight if there are any immediate concerns with labs which may indicate need for imaging this weekend at the er.  If labs remain normal then I recommend following up with primary care or general surgery in order to image your gallbladder to evaluate status of potential stones.  If significant pain, nausea or vomiting, fevers, or otherwise worsening please go to the ER.

## 2019-07-18 NOTE — ED Notes (Signed)
Bed: UC07 Expected date:  Expected time:  Means of arrival:  Comments: appt

## 2019-07-18 NOTE — ED Triage Notes (Signed)
Pt states she has recurring flare up issues and pain with her gall bladder since last year; pt states she has not followed up with anyone since her visit last year about it.

## 2019-07-18 NOTE — ED Provider Notes (Signed)
[place]    CSN: [id number] Arrival date & time: 07/18/19  1408      History   Chief Complaint Chief Complaint  Patient presents with  . Gall Bladder Issues    HPI Tiffany Ayers is a 44 y.o. female.   Tiffany Ayers presents with complaints of RUQ abdominal pain. Has had flares of this for the past year now. She was seen here 11/29/2018 and was told likely gallstones/ gall bladder disease. She did not have insurance at the time, has not sough further evaluation since. Ate fried food last night, pain woke her last night and has persistent. No nausea or vomiting. Sometimes she experiences indigestion as well, tums does help. Has tried ibuprofen which hasn't helped with pain. No fevers. Pain is dull constant to RUQ. History  Of gastric banding. Unable to tolerate tramdol or vicodin.     ROS per HPI, negative if not otherwise mentioned.      Past Medical History:  Diagnosis Date  . Anti-phospholipid antibody syndrome (HCC)   . Medical history non-contributory   . 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 S/P cesarean section 08/21/2011    Patient Active Problem List   Diagnosis Date Noted  . Rotator cuff tendonitis, right 06/18/2019  . Greater trochanteric bursitis of left hip 06/18/2019  . Chronic right-sided low back pain with right-sided sciatica 07/05/2018  . Forgetfulness 12/05/2016  . Headache 04/18/2016  . 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 CESAREAN SECTION  2004  . CESAREAN SECTION  08/21/2011   Procedure: CESAREAN SECTION;  Surgeon: Thornell Sartorius, MD;  Location: [facility];  Service: Gynecology;  Laterality: N/A;  . CESAREAN SECTION WITH BILATERAL TUBAL LIGATION Bilateral 02/01/2013   Procedure: REPEAT CESAREAN SECTION WITH BILATERAL TUBAL LIGATION;  Surgeon: Mora Bellman, MD;  Location: [facility];  Service: Obstetrics;  Laterality: Bilateral;  . KNEE  ARTHROSCOPY  06/05/1998  . LAPAROSCOPIC GASTRIC BANDING    . TONSILLECTOMY    . TONSILLECTOMY      OB History    Gravida  7   Para  4   Term  4   Preterm      AB  3   Living  4     SAB  3   TAB      Ectopic      Multiple      Live Births  4            Home Medications    Prior to Admission medications   Medication Sig Start Date End Date Taking? Authorizing Provider  naproxen (NAPROSYN) 500 MG tablet Take 1 tablet (500 mg total) by mouth 2 (two) times daily with a meal. 06/17/19   Meccariello, Bernita Raisin, DO    Family History Family History  Problem Relation Age of Onset  . Healthy Mother   . Healthy Father   . Diabetes Maternal Grandmother   . Heart disease Maternal Grandmother   . Cleft lip Sister   . Heart disease Maternal Grandfather     Social History Social History   Tobacco Use  . Smoking status: Never Smoker  . Smokeless tobacco: Never Used  Substance Use Topics  . Alcohol use: No    Comment: occasional  . Drug use: No     Allergies   Tramadol and Vicodin [hydrocodone-acetaminophen]   Review of Systems Review of Systems   Physical Exam  Triage Vital Signs ED Triage Vitals  Enc Vitals Group     BP 07/18/19 1425 135/83     Pulse Rate 07/18/19 1425 66     Resp 07/18/19 1425 17     Temp 07/18/19 1425 98.5 F (36.9 C)     Temp Source 07/18/19 1425 Oral     SpO2 07/18/19 1425 97 %     Weight --      Height --      Head Circumference --      Peak Flow --      Pain Score 07/18/19 1424 7     Pain Loc --      Pain Edu? --      Excl. in GC? --    No data found.  Updated Vital Signs BP 135/83 (BP Location: Right Arm)   Pulse 66   Temp 98.5 F (36.9 C) (Oral)   Resp 17   LMP 07/14/2019   SpO2 97%    Physical Exam Constitutional:      General: She is not in acute distress.    Appearance: She is well-developed.  Cardiovascular:     Rate and Rhythm: Normal rate.  Pulmonary:     Effort: Pulmonary effort is normal.    Abdominal:     Tenderness: There is abdominal tenderness in the right upper quadrant.  Skin:    General: Skin is warm and dry.  Neurological:     Mental Status: She is alert and oriented to person, place, and time.      UC Treatments / Results  Labs (all labs ordered are listed, but only abnormal results are displayed) Labs Reviewed  POCT URINALYSIS DIP (DEVICE) - Abnormal; Notable for the following components:      Result Value   Hgb urine dipstick LARGE (*)    All other components within normal limits  CBC WITH DIFFERENTIAL/PLATELET  COMPREHENSIVE METABOLIC PANEL  LIPASE, BLOOD    EKG   Radiology No results found.  Procedures Procedures (including critical care time)  Medications Ordered in UC Medications  ketorolac (TORADOL) 30 MG/ML injection 30 mg (has no administration in time range)    Initial Impression / Assessment and Plan / UC Course  I have reviewed the triage vital signs and the nursing notes.  Pertinent labs & imaging results that were available during my care of the patient were reviewed by me and considered in my medical decision making (see chart for details).     RUQ pain, has been intermittent for basically a year. Worse today after eating fried food last night. Ibuprofen last at 11a, states she is unable to tolerate tramadol, vicodin also listed as allergy. toradol IM provided here today for pain relief. Basic labs and lipase collected and will notify patient if these are significant. Discussed diet plan, use of omeprazole for prevention of indigestion. Encouraged outpatient ultrasound with PCP and/or general surgery if labs normal here today and pain is managed. Er if worsening of symptoms. Patient verbalized understanding and agreeable to plan.   Final Clinical Impressions(s) / UC Diagnoses   Final diagnoses:  Abdominal pain, right upper quadrant     Discharge Instructions     I am hopeful the medication we have given you today is helpful  with your immediate pain.  Low fat diet will help in prevention of flares, see provided information about gall stones and eating plan.  I will notify you this evening/tonight if there are any immediate concerns with labs which may  indicate need for imaging this weekend at the er.  If labs remain normal then I recommend following up with primary care or general surgery in order to image your gallbladder to evaluate status of potential stones.  If significant pain, nausea or vomiting, fevers, or otherwise worsening please go to the ER.    ED Prescriptions    None     PDMP not reviewed this encounter.   Georgetta Haber, NP 07/18/19 1529

## 2019-07-25 ENCOUNTER — Encounter: Payer: Self-pay | Admitting: Family Medicine

## 2019-07-25 ENCOUNTER — Other Ambulatory Visit: Payer: Self-pay

## 2019-07-25 ENCOUNTER — Ambulatory Visit: Payer: 59 | Admitting: Family Medicine

## 2019-07-25 ENCOUNTER — Telehealth: Payer: Self-pay

## 2019-07-25 VITALS — BP 120/76 | HR 65 | Temp 96.9°F | Ht 66.25 in | Wt 225.2 lb

## 2019-07-25 DIAGNOSIS — R1011 Right upper quadrant pain: Secondary | ICD-10-CM | POA: Diagnosis not present

## 2019-07-25 DIAGNOSIS — M25511 Pain in right shoulder: Secondary | ICD-10-CM

## 2019-07-25 DIAGNOSIS — Z9884 Bariatric surgery status: Secondary | ICD-10-CM | POA: Diagnosis not present

## 2019-07-25 DIAGNOSIS — M7581 Other shoulder lesions, right shoulder: Secondary | ICD-10-CM | POA: Diagnosis not present

## 2019-07-25 DIAGNOSIS — G8929 Other chronic pain: Secondary | ICD-10-CM | POA: Insufficient documentation

## 2019-07-25 NOTE — Progress Notes (Signed)
Subjective:    Patient ID: Tiffany Ayers, female    DOB: 03/19/1976, 44 y.o.   MRN: [id number]  HPI Chief Complaint  Patient presents with  . other    gallbladder issues    She is new to the practice and here to establish care.  She goes by [name]"  Previous medical care- no regular health insurance recently.   Other providers:  [facility]   Complains of a one year history of intermittent RUQ pain that radiates around to her right upper back. States the pain is occasionally associated with eating but not always.  Some bloating and "gassy".  Pain started while sleeping last night and is still present. States pain is dull and constant currently.   Denies fever, chills, unexplained weight loss,  nausea or vomiting. No changes in bowel habits.   She was evaluated for this pain at urgent care on 07/18/2019. She was referred to PCP to get an [place].  No previous imaging of her gallbladder per patient.   History of gastric band surgery and normal upper GI series on 07/15/2019. This was ordered by Dr. Daphine Deutscher at [facility].    Chronic right shoulder pain. No known injury but initial onset of pain occurred while she was reaching back and lifting. PT was recommended in the past but did not ever get it.  Takes ibuprofen for pain as needed.  No numbness, tingling or weakness.   LMP: 07/14/2019 Tubal ligation   Married with 3 kids. She is a Environmental manager.   Occasional alcohol. No smoking or drug use.   Reviewed allergies, medications, past medical, surgical,  and social history.   Review of Systems Pertinent positives and negatives in the history of present illness.     Objective:   Physical Exam Constitutional:      General: She is not in acute distress.    Appearance: Normal appearance. She is not ill-appearing.  Eyes:     Conjunctiva/sclera: Conjunctivae normal.  Cardiovascular:     Rate and Rhythm: Normal rate and regular rhythm.     Pulses: Normal  pulses.     Heart sounds: Normal heart sounds.  Pulmonary:     Effort: Pulmonary effort is normal.     Breath sounds: Normal breath sounds.  Abdominal:     General: Abdomen is flat. Bowel sounds are normal. There is no distension.     Palpations: Abdomen is soft. There is no hepatomegaly or mass.     Tenderness: There is no abdominal tenderness. There is no right CVA tenderness, left CVA tenderness, guarding or rebound. Negative signs include Murphy's sign, Rovsing's sign and McBurney's sign.  Musculoskeletal:     Right shoulder: Tenderness present. No deformity. Decreased range of motion. Normal strength. Normal pulse.     Left shoulder: Normal.     Cervical back: Normal range of motion and neck supple.  Lymphadenopathy:     Cervical: No cervical adenopathy.  Skin:    General: Skin is warm and dry.  Neurological:     General: No focal deficit present.     Mental Status: She is alert and oriented to person, place, and time.     Cranial Nerves: Cranial nerves are intact.     Sensory: Sensation is intact.     Motor: Motor function is intact.     Coordination: Coordination is intact.    BP 120/76 (BP Location: Left Arm, Patient Position: Sitting)   Pulse 65   Temp (!) 96.9  F (36.1 C)   Ht 5' 6.25" (1.683 m)   Wt 225 lb 3.2 oz (102.2 kg)   LMP 07/14/2019   SpO2 98%   BMI 36.07 kg/m        Assessment & Plan:  Chronic RUQ pain - Plan: US Abdomen Limited RUQ  Chronic right shoulder pain - Plan: Ambulatory referral to Physical Therapy  Rotator cuff tendonitis, right - Plan: Ambulatory referral to Physical Therapy  History of laparoscopic adjustable gastric banding, APS, 02/01/2010+ HH repair  She is new to the practice.  Reviewed recent notes, labs from [facility] and surgeon.  No red flag symptoms and benign abdominal exam.  RUQ [place] ordered.  Refer to PT for chronic right shoulder pain.  Follow up pending results. Also needs CPE in future.

## 2019-07-25 NOTE — Telephone Encounter (Signed)
LVM for pt advising her of her U/S Tuesday 07-29-19 at 7:45 am. Pt needs to go to [place] for imaging. KH

## 2019-07-29 ENCOUNTER — Encounter: Payer: Self-pay | Admitting: Family Medicine

## 2019-07-29 ENCOUNTER — Ambulatory Visit (HOSPITAL_COMMUNITY)
Admission: RE | Admit: 2019-07-29 | Discharge: 2019-07-29 | Disposition: A | Discharge status: Home / Self Care | Payer: 59 | Source: Ambulatory Visit | Attending: Family Medicine | Admitting: Family Medicine

## 2019-07-29 ENCOUNTER — Other Ambulatory Visit: Payer: Self-pay

## 2019-07-29 DIAGNOSIS — R1011 Right upper quadrant pain: Secondary | ICD-10-CM | POA: Diagnosis present

## 2019-07-29 DIAGNOSIS — G8929 Other chronic pain: Secondary | ICD-10-CM | POA: Diagnosis present

## 2019-07-29 DIAGNOSIS — R932 Abnormal findings on diagnostic imaging of liver and biliary tract: Secondary | ICD-10-CM | POA: Insufficient documentation

## 2019-07-29 HISTORY — DX: Abnormal findings on diagnostic imaging of liver and biliary tract: R93.2

## 2019-08-06 ENCOUNTER — Other Ambulatory Visit: Payer: Self-pay | Admitting: Gastroenterology

## 2019-08-06 ENCOUNTER — Other Ambulatory Visit (HOSPITAL_COMMUNITY): Payer: Self-pay | Admitting: Gastroenterology

## 2019-08-06 ENCOUNTER — Other Ambulatory Visit: Payer: Self-pay | Admitting: Internal Medicine

## 2019-08-06 DIAGNOSIS — R1011 Right upper quadrant pain: Secondary | ICD-10-CM

## 2019-08-06 DIAGNOSIS — M25511 Pain in right shoulder: Secondary | ICD-10-CM

## 2019-08-06 DIAGNOSIS — G8929 Other chronic pain: Secondary | ICD-10-CM

## 2019-08-06 DIAGNOSIS — M7581 Other shoulder lesions, right shoulder: Secondary | ICD-10-CM

## 2019-08-14 ENCOUNTER — Other Ambulatory Visit: Payer: Self-pay

## 2019-08-14 ENCOUNTER — Encounter (HOSPITAL_COMMUNITY)
Admission: RE | Admit: 2019-08-14 | Discharge: 2019-08-14 | Disposition: A | Discharge status: Home / Self Care | Payer: 59 | Source: Ambulatory Visit | Attending: Gastroenterology | Admitting: Gastroenterology

## 2019-08-14 DIAGNOSIS — R1011 Right upper quadrant pain: Secondary | ICD-10-CM | POA: Insufficient documentation

## 2019-08-14 MED ORDER — TECHNETIUM TC 99M MEBROFENIN IV KIT
5.0000 | PACK | Freq: Once | INTRAVENOUS | Status: AC | PRN
  Administered 2019-08-14: 5 via INTRAVENOUS

## 2019-08-20 ENCOUNTER — Other Ambulatory Visit: Payer: Self-pay

## 2019-08-20 ENCOUNTER — Encounter: Payer: Self-pay | Admitting: Physical Therapy

## 2019-08-20 ENCOUNTER — Ambulatory Visit: Payer: 59 | Attending: Family Medicine | Admitting: Physical Therapy

## 2019-08-20 DIAGNOSIS — M25511 Pain in right shoulder: Secondary | ICD-10-CM | POA: Insufficient documentation

## 2019-08-20 DIAGNOSIS — M6281 Muscle weakness (generalized): Secondary | ICD-10-CM | POA: Diagnosis present

## 2019-08-20 DIAGNOSIS — G8929 Other chronic pain: Secondary | ICD-10-CM | POA: Diagnosis present

## 2019-08-20 NOTE — Therapy (Signed)
[facility] [address] [place], [place], [address] Phone: [phone]   Fax:  [phone]  Physical Therapy Evaluation  Patient Details  Name: Tiffany Ayers MRN: [id number] Date of Birth: 1975/07/05 Referring Provider (PT): Beather Arbour, NP   Encounter Date: 08/20/2019  PT End of Session - 08/20/19 1057    Visit Number  1    Number of Visits  12    Date for PT Re-Evaluation  10/15/19    Authorization Type  [facility]    PT Start Time  0915    PT Stop Time  1006    PT Time Calculation (min)  51 min    Activity Tolerance  Patient tolerated treatment well    Behavior During Therapy  [facility] for tasks assessed/performed       Past Medical History:  Diagnosis Date  . Abnormal liver ultrasound 07/29/2019  . Anti-phospholipid antibody syndrome (HCC)   . Medical history non-contributory   . 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 S/P cesarean section 08/21/2011    Past Surgical History:  Procedure Laterality Date  . CESAREAN SECTION  2004  . CESAREAN SECTION  08/21/2011   Procedure: CESAREAN SECTION;  Surgeon: Sherron Monday, MD;  Location: [facility];  Service: Gynecology;  Laterality: N/A;  . CESAREAN SECTION WITH BILATERAL TUBAL LIGATION Bilateral 02/01/2013   Procedure: REPEAT CESAREAN SECTION WITH BILATERAL TUBAL LIGATION;  Surgeon: Catalina Antigua, MD;  Location: [facility];  Service: Obstetrics;  Laterality: Bilateral;  . KNEE ARTHROSCOPY  06/05/1998  . LAPAROSCOPIC GASTRIC BANDING    . TONSILLECTOMY    . TONSILLECTOMY      There were no vitals filed for this visit.   Subjective Assessment - 08/20/19 0920    Subjective  Patient has had shoulder pain "forever" (1 year).  I think I hurt it by using my Rt arm to help me change positions. 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 She has pain and notices weakness at times. Denies sensory changes.    Pertinent History  plantar fasciitis, gall  bladder surgery removal upcoming    Limitations  Lifting;House hold activities;Other (comment)    Diagnostic tests  none yet    Patient Stated Goals  I want to sleep on my right side .    Currently in Pain?  Yes    Pain Score  2     Pain Location  Shoulder    Pain Orientation  Right;Proximal;Anterior    Pain Descriptors / Indicators  Discomfort    Pain Type  Chronic pain    Pain Onset  More than a month ago    Pain Frequency  Intermittent    Aggravating Factors   laying on it , reaching back , lifting    Pain Relieving Factors  rest    Effect of Pain on Daily Activities  uncomfortable to use for work, rest, home activities         [facility] PT Assessment - 08/20/19 0001      Assessment   Medical Diagnosis  Rt shoulder pain, tendinopathy     Referring Provider (PT)  Beather Arbour, NP    Onset Date/Surgical Date  --   1 year +   Hand Dominance  Right    Prior Therapy  No       Precautions   Precautions  None      Restrictions   Weight Bearing Restrictions  No      Balance Screen  Has the patient fallen in the past 6 months  No      [place] residence    Living Arrangements  Spouse/significant other    Type of [place]    Additional Comments  3 children      Prior Function   Level of Independence  Independent    Vocation  Self employed    Building surveyor, lifting equipment, loading car/trunk     Leisure  "We're boring"       Cognition   Overall Cognitive Status  Within Functional Limits for tasks assessed      Observation/Other Assessments   Focus on Therapeutic Outcomes (FOTO)   NT did not have set up       Sensation   Light Touch  Appears Intact      Posture/Postural Control   Posture/Postural Control  Postural limitations    Postural Limitations  Rounded Shoulders;Forward head    Posture Comments  Rt shoulder elevated       AROM   Right Shoulder Extension  45 Degrees    Right Shoulder Flexion   160 Degrees   pain at 120 deg    Right Shoulder ABduction  160 Degrees   pain 90 deg and up    Right Shoulder Internal Rotation  --   FR to T6    Right Shoulder External Rotation  --   FR to T2   Left Shoulder Internal Rotation  --   Fr to T4   Left Shoulder External Rotation  --   Fr to T4     PROM   Overall PROM Comments  Pain with passive flexion and abduction of Rt shoulder, ROM WNL. min pain end range IR at 90 deg abd. Passive ER >90 deg       Strength   Right Shoulder Flexion  4/5    Right Shoulder ABduction  4/5    Right Shoulder Internal Rotation  5/5    Right Shoulder External Rotation  5/5    Left Shoulder Flexion  5/5    Left Shoulder ABduction  5/5    Left Shoulder Internal Rotation  5/5    Left Shoulder External Rotation  5/5      Palpation   Palpation comment  pain along Rt supraspinatus tendon, min anterior shoulder  and sore with palpation to infraspinatus , post cuff                 Objective measurements completed on examination: See above findings.      [facility] Adult PT Treatment/Exercise - 08/20/19 0001      Shoulder Exercises: Standing   Horizontal ABduction  Strengthening;Both;10 reps    Theraband Level (Shoulder Horizontal ABduction)  Level 3 (Green)    External Rotation  Strengthening;Both;12 reps    Theraband Level (Shoulder External Rotation)  Level 3 (Green)    Flexion  Strengthening    Theraband Level (Shoulder Flexion)  Level 3 (Green)      Modalities   Modalities  Cryotherapy      Cryotherapy   Number Minutes Cryotherapy  8 Minutes    Cryotherapy Location  Shoulder    Type of Cryotherapy  Ice pack             PT Education - 08/20/19 1056    Education Details  PT/POC, HEP, posture and rotator cuff muscle function. Repetitive stress.    Person(s) Educated  Patient    Methods  Explanation;Demonstration;Handout    Comprehension  Verbalized understanding;Returned demonstration          PT Long Term Goals -  08/20/19 1058      PT LONG TERM GOAL #1   Title  Pt will be able to show Independence with HEP for Rt UE strength, posture    Baseline  given on eval    Time  6    Period  Weeks    Status  New    Target Date  10/01/19      PT LONG TERM GOAL #2   Title  Pt will be able to lift Rt UE through AROM and report no increase in pain (flexion and abduction)    Baseline  painful arc    Time  6    Period  Weeks    Status  New    Target Date  10/01/19      PT LONG TERM GOAL #3   Title  Pt will demo Rt UE strength to 5/5 in flexion and abduction to maximize Rt UE function for work.    Baseline  4/5 , pain    Time  6    Period  Weeks    Status  New    Target Date  10/01/19      PT LONG TERM GOAL #4   Title  Pt will be able to lie on Rt shoulder at night (2-3 hours) with only min pain    Baseline  unable to do at all , pain can be 6/10    Time  6    Period  Weeks    Status  New    Target Date  10/01/19             Plan - 08/20/19 1102    Clinical Impression Statement  Patient presents for low complexity eval of Rt shoulder pain which is consistent with rotator cuff tendinopathy.  She was very tender along supraspinatus and showed pain with abduction, full/empty can. She was given exercises for improving posture and scapular position to aid in reducing inflammation.  Her repetitive work habits may have contributed over the year as well, despite having a reduced workload.  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    Examination-Activity Limitations  Reach Overhead;Lift;Sleep;Carry    Examination-Participation Restrictions  Interpersonal Relationship    Stability/Clinical Decision Making  Stable/Uncomplicated    Clinical Decision Making  Low    Rehab Potential  Excellent    PT Frequency  2x / week    PT Duration  6 weeks   allow 8 weeks for scheduling   PT Treatment/Interventions  ADLs/Self Care Home Management;Electrical  Stimulation;Ultrasound;Moist Heat;Iontophoresis 4mg /ml Dexamethasone;Functional mobility training;Therapeutic activities;Neuromuscular re-education;Therapeutic exercise;Patient/family education;Manual techniques;Dry needling;Taping    PT Next Visit Plan  check HEP , ionto/US? posture    PT Home Exercise Plan  supine scapular stab with green band    Consulted and Agree with Plan of Care  Patient       Patient will benefit from skilled therapeutic intervention in order to improve the following deficits and impairments:  Decreased strength, Increased fascial restricitons, Decreased mobility, Postural dysfunction, Pain, Improper body mechanics, Impaired UE functional use  Visit Diagnosis: Chronic right shoulder pain  Muscle weakness (generalized)     Problem List Patient Active Problem List   Diagnosis Date Noted  . Abnormal liver ultrasound 07/29/2019  . Chronic RUQ pain 07/25/2019  . Chronic  right shoulder pain 07/25/2019  . Rotator cuff tendonitis, right 06/18/2019  . Greater trochanteric bursitis of left hip 06/18/2019  . Chronic right-sided low back pain with right-sided sciatica 07/05/2018  . Forgetfulness 12/05/2016  . Headache 04/18/2016  . History of laparoscopic adjustable gastric banding, APS, 02/01/2010+ HH repair 07/10/2012    Niccolas Loeper 08/20/2019, 11:16 AM  [facility] [address] [place], [place], [address] Phone: [phone]   Fax:  [phone]  Name: Tiffany Ayers MRN: [id number] Date of Birth: August 12, 1975   Karie Mainland, PT 08/20/19 11:16 AM Phone: [phone] Fax: [phone]

## 2019-08-20 NOTE — Patient Instructions (Signed)
Over Head Pull: Narrow Grip       On back, knees bent, feet flat, band across thighs, elbows straight but relaxed. Pull hands apart (start). Keeping elbows straight, bring arms up and over head, hands toward floor. Keep pull steady on band. Hold momentarily. Return slowly, keeping pull steady, back to start. Repeat __10-20_ times. Band color ____green__   Side Pull: Double Arm   On back, knees bent, feet flat. Arms perpendicular to body, shoulder level, elbows straight but relaxed. Pull arms out to sides, elbows straight. Resistance band comes across collarbones, hands toward floor. Hold momentarily. Slowly return to starting position. Repeat __10-20_ times. Band color ___GREEN__   Sash   On back, knees bent, feet flat, left hand on left hip, right hand above left. Pull right arm DIAGONALLY (hip to shoulder) across chest. Bring right arm along head toward floor. Hold momentarily. Slowly return to starting position. Repeat __10_ times. Do with left arm. Band color __GREEN____   Shoulder Rotation: Double Arm   On back, knees bent, feet flat, elbows tucked at sides, bent 90, hands palms up. Pull hands apart and down toward floor, keeping elbows near sides. Hold momentarily. Slowly return to starting position. Repeat _10-20__ times. Band color __GREEN____

## 2019-08-25 ENCOUNTER — Encounter: Payer: Self-pay | Admitting: Physical Therapy

## 2019-08-25 ENCOUNTER — Other Ambulatory Visit: Payer: Self-pay

## 2019-08-25 ENCOUNTER — Ambulatory Visit: Payer: 59 | Admitting: Physical Therapy

## 2019-08-25 DIAGNOSIS — G8929 Other chronic pain: Secondary | ICD-10-CM

## 2019-08-25 DIAGNOSIS — M6281 Muscle weakness (generalized): Secondary | ICD-10-CM

## 2019-08-25 DIAGNOSIS — M25511 Pain in right shoulder: Secondary | ICD-10-CM | POA: Diagnosis not present

## 2019-08-25 NOTE — Patient Instructions (Signed)

## 2019-08-25 NOTE — Therapy (Signed)
[place], [place], [address] Phone: [phone]   Fax:  [phone]  Physical Therapy Treatment  Patient Details  Name: Tiffany Ayers MRN: [id number] Date of Birth: 01/08/1976 Referring Provider (PT): Mack Hook, NP   Encounter Date: 08/25/2019  PT End of Session - 08/25/19 1148    Visit Number  2    Number of Visits  12    Date for PT Re-Evaluation  10/15/19    Authorization Type  [employer]    PT Start Time  1130    PT Stop Time  1220    PT Time Calculation (min)  50 min    Activity Tolerance  Patient tolerated treatment well    Behavior During Therapy  [facility] for tasks assessed/performed       Past Medical History:  Diagnosis Date  . Abnormal liver ultrasound 07/29/2019  . Anti-phospholipid antibody syndrome (HCC)   . Medical history non-contributory   . 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 S/P cesarean section 08/21/2011    Past Surgical History:  Procedure Laterality Date  . CESAREAN SECTION  2004  . CESAREAN SECTION  08/21/2011   Procedure: CESAREAN SECTION;  Surgeon: Thornell Sartorius, MD;  Location: [facility];  Service: Gynecology;  Laterality: N/A;  . CESAREAN SECTION WITH BILATERAL TUBAL LIGATION Bilateral 02/01/2013   Procedure: REPEAT CESAREAN SECTION WITH BILATERAL TUBAL LIGATION;  Surgeon: Mora Bellman, MD;  Location: [facility];  Service: Obstetrics;  Laterality: Bilateral;  . KNEE ARTHROSCOPY  06/05/1998  . LAPAROSCOPIC GASTRIC BANDING    . TONSILLECTOMY    . TONSILLECTOMY      There were no vitals filed for this visit.  Subjective Assessment - 08/25/19 1134    Subjective  I am sore in my chest, problems increased at night.    Currently in Pain?  Yes    Pain Score  5     Pain Location  Shoulder   also chest      [facility] Adult PT Treatment/Exercise - 08/25/19 0001      Shoulder Exercises: Supine   Horizontal ABduction  Strengthening;Both;20 reps    Theraband Level (Shoulder  Horizontal ABduction)  Level 2 (Red)    Horizontal ABduction Weight (lbs)  modified to red due to soreness     External Rotation  Strengthening;Both;20 reps;Theraband    Theraband Level (Shoulder External Rotation)  Level 2 (Red)    Diagonals  Strengthening;Right;12 reps    Theraband Level (Shoulder Diagonals)  Level 2 (Red)      Shoulder Exercises: Standing   Extension  Strengthening;Both;15 reps    Theraband Level (Shoulder Extension)  Level 3 (Green)    Row  Strengthening;Both;15 reps;Theraband    Theraband Level (Shoulder Row)  Level 3 (Green)      Shoulder Exercises: ROM/Strengthening   UBE (Upper Arm Bike)  5 min L1 , 10 rep intervals to challenge Rt UE    in reverse    Other ROM/Strengthening Exercises  quadruped cat/camel x 5 to childs pose , thoracic rotation       Shoulder Exercises: Stretch   Corner Stretch  3 reps;30 seconds      Modalities   Modalities  Iontophoresis      Iontophoresis   Type of Iontophoresis  Dexamethasone    Location  Rt suprapsinatus     Dose  1 cc     Time  6 hr      Manual Therapy   Manual Therapy  Soft tissue mobilization  Soft tissue mobilization  Rt upper trap, superior anterior shoulder              PT Education - 08/25/19 1320    Education Details  anatomy, HEP modifcation    Person(s) Educated  Patient    Methods  Explanation;Demonstration    Comprehension  Verbalized understanding          PT Long Term Goals - 08/20/19 1058      PT LONG TERM GOAL #1   Title  Pt will be able to show Independence with HEP for Rt UE strength, posture    Baseline  given on eval    Time  6    Period  Weeks    Status  New    Target Date  10/01/19      PT LONG TERM GOAL #2   Title  Pt will be able to lift Rt UE through AROM and report no increase in pain (flexion and abduction)    Baseline  painful arc    Time  6    Period  Weeks    Status  New    Target Date  10/01/19      PT LONG TERM GOAL #3   Title  Pt will demo Rt UE  strength to 5/5 in flexion and abduction to maximize Rt UE function for work.    Baseline  4/5 , pain    Time  6    Period  Weeks    Status  New    Target Date  10/01/19      PT LONG TERM GOAL #4   Title  Pt will be able to lie on Rt shoulder at night (2-3 hours) with only min pain    Baseline  unable to do at all , pain can be 6/10    Time  6    Period  Weeks    Status  New    Target Date  10/01/19            Plan - 08/25/19 1148    Clinical Impression Statement  Pt with increased soreness in chest, modified exercises to ensure correct techniqiue.  No AROM limitations. Pain with cross body adduction and flexion combined. Localized pain and given ionto to target supraspinatus.    PT Treatment/Interventions  ADLs/Self Care Home Management;Electrical Stimulation;Ultrasound;Moist Heat;Iontophoresis 4mg /ml Dexamethasone;Functional mobility training;Therapeutic activities;Neuromuscular re-education;Therapeutic exercise;Patient/family education;Manual techniques;Dry needling;Taping    PT Next Visit Plan  manual/DN , ionto/US? posture    PT Home Exercise Plan  supine scapular stab with green/red band    Consulted and Agree with Plan of Care  Patient       Patient will benefit from skilled therapeutic intervention in order to improve the following deficits and impairments:  Decreased strength, Increased fascial restricitons, Decreased mobility, Postural dysfunction, Pain, Improper body mechanics, Impaired UE functional use  Visit Diagnosis: Chronic right shoulder pain  Muscle weakness (generalized)     Problem List Patient Active Problem List   Diagnosis Date Noted  . Abnormal liver ultrasound 07/29/2019  . Chronic RUQ pain 07/25/2019  . Chronic right shoulder pain 07/25/2019  . Rotator cuff tendonitis, right 06/18/2019  . Greater trochanteric bursitis of left hip 06/18/2019  . Chronic right-sided low back pain with right-sided sciatica 07/05/2018  . Forgetfulness 12/05/2016   . Headache 04/18/2016  . 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

## 2019-09-03 ENCOUNTER — Other Ambulatory Visit: Payer: Self-pay

## 2019-09-03 ENCOUNTER — Encounter: Payer: Self-pay | Admitting: Physical Therapy

## 2019-09-03 ENCOUNTER — Ambulatory Visit: Payer: 59 | Admitting: Physical Therapy

## 2019-09-03 DIAGNOSIS — G8929 Other chronic pain: Secondary | ICD-10-CM

## 2019-09-03 DIAGNOSIS — M6281 Muscle weakness (generalized): Secondary | ICD-10-CM

## 2019-09-03 DIAGNOSIS — M25511 Pain in right shoulder: Secondary | ICD-10-CM | POA: Diagnosis not present

## 2019-09-03 NOTE — Therapy (Signed)
[facility] [address] [place], [place], [address] Phone: [phone]   Fax:  [phone]  Physical Therapy Treatment  Patient Details  Name: Tiffany Ayers MRN: [id number] Date of Birth: Feb 23, 1976 Referring Provider (PT): Beather Arbour, NP   Encounter Date: 09/03/2019  PT End of Session - 09/03/19 1141    Visit Number  3    Number of Visits  12    Date for PT Re-Evaluation  10/15/19    Authorization Type  [employer]    PT Start Time  1058    PT Stop Time  1155   time spent for hot pack and dry needling, estim not included in direct minutes   PT Time Calculation (min)  57 min    Activity Tolerance  Patient tolerated treatment well    Behavior During Therapy  [facility] for tasks assessed/performed       Past Medical History:  Diagnosis Date  . Abnormal liver ultrasound 07/29/2019  . Anti-phospholipid antibody syndrome (HCC)   . Medical history non-contributory   . 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 S/P cesarean section 08/21/2011    Past Surgical History:  Procedure Laterality Date  . CESAREAN SECTION  2004  . CESAREAN SECTION  08/21/2011   Procedure: CESAREAN SECTION;  Surgeon: Sherron Monday, MD;  Location: [facility];  Service: Gynecology;  Laterality: N/A;  . CESAREAN SECTION WITH BILATERAL TUBAL LIGATION Bilateral 02/01/2013   Procedure: REPEAT CESAREAN SECTION WITH BILATERAL TUBAL LIGATION;  Surgeon: Catalina Antigua, MD;  Location: [facility];  Service: Obstetrics;  Laterality: Bilateral;  . KNEE ARTHROSCOPY  06/05/1998  . LAPAROSCOPIC GASTRIC BANDING    . TONSILLECTOMY    . TONSILLECTOMY      There were no vitals filed for this visit.  Subjective Assessment - 09/03/19 1059    Subjective  Shoulder still sore, may have overdone it last week with activity. Hurts "up neck" with HEP into upper trapezius region. No pain at rest but reports pain 3-4/10 with activity.    Currently in Pain?  No/denies                        [facility] Adult PT Treatment/Exercise - 09/03/19 0001      Shoulder Exercises: Supine   Horizontal ABduction  Strengthening;Both;20 reps    Theraband Level (Shoulder Horizontal ABduction)  Level 2 (Red)    External Rotation  Strengthening;Both;20 reps;Theraband    Theraband Level (Shoulder External Rotation)  Level 2 (Red)      Shoulder Exercises: Seated   Row  AROM;Strengthening;Both;20 reps    Theraband Level (Shoulder Row)  Level 2 (Red)      Shoulder Exercises: Sidelying   External Rotation  AROM;Strengthening;Right;20 reps    External Rotation Weight (lbs)  1    ABduction Limitations  scaption 1 lb. 2x10      Modalities   Modalities  Moist Heat      Moist Heat Therapy   Number Minutes Moist Heat  10 Minutes    Moist Heat Location  Shoulder   right upper trapezius region     Manual Therapy   Soft tissue mobilization  Right posterior shoulder/periscapular region and deltoid       Trigger Point Dry Needling - 09/03/19 0001    Consent Given?  Yes    Education Handout Provided  Yes    Muscles Treated Head and Neck  Upper trapezius    Muscles Treated Upper Quadrant  Supraspinatus;Infraspinatus;Deltoid  Dry Needling Comments  needling in left sidelying with 32 gauge 30 and 50 mm needles    Electrical Stimulation Performed with Dry Needling  Yes    E-stim with Dry Needling Details  TENS 20 pps x 10 minutes           PT Education - 09/03/19 1141    Education Details  dry needling, exercises    Person(s) Educated  Patient    Methods  Explanation;Demonstration    Comprehension  Verbalized understanding;Returned demonstration          PT Long Term Goals - 08/20/19 1058      PT LONG TERM GOAL #1   Title  Pt will be able to show Independence with HEP for Rt UE strength, posture    Baseline  given on eval    Time  6    Period  Weeks    Status  New    Target Date  10/01/19      PT LONG TERM GOAL #2   Title  Pt will be able to  lift Rt UE through AROM and report no increase in pain (flexion and abduction)    Baseline  painful arc    Time  6    Period  Weeks    Status  New    Target Date  10/01/19      PT LONG TERM GOAL #3   Title  Pt will demo Rt UE strength to 5/5 in flexion and abduction to maximize Rt UE function for work.    Baseline  4/5 , pain    Time  6    Period  Weeks    Status  New    Target Date  10/01/19      PT LONG TERM GOAL #4   Title  Pt will be able to lie on Rt shoulder at night (2-3 hours) with only min pain    Baseline  unable to do at all , pain can be 6/10    Time  6    Period  Weeks    Status  New    Target Date  10/01/19            Plan - 09/03/19 1141    Clinical Impression Statement  Continues with right shoulder pain with rotator cuff tendinopathy symptoms. Continued gentle rotator cuff strengthening and manual with also trial dry needling today. Some soreness (with needling) particularly in upper trapezius region-expect may take 1-2 days to note results so will await response by next visit.    Examination-Activity Limitations  Reach Overhead;Lift;Sleep;Carry    Examination-Participation Restrictions  Interpersonal Relationship    Stability/Clinical Decision Making  Stable/Uncomplicated    Rehab Potential  Excellent    PT Frequency  2x / week    PT Duration  6 weeks    PT Treatment/Interventions  ADLs/Self Care Home Management;Electrical Stimulation;Ultrasound;Moist Heat;Iontophoresis 4mg /ml Dexamethasone;Functional mobility training;Therapeutic activities;Neuromuscular re-education;Therapeutic exercise;Patient/family education;Manual techniques;Dry needling;Taping    PT Next Visit Plan  check response dry needling, further ionto/US, manual, progress exercises as tolerated    PT Home Exercise Plan  supine scapular stab with green/red band    Consulted and Agree with Plan of Care  Patient       Patient will benefit from skilled therapeutic intervention in order to  improve the following deficits and impairments:  Decreased strength, Increased fascial restricitons, Decreased mobility, Postural dysfunction, Pain, Improper body mechanics, Impaired UE functional use  Visit Diagnosis: Chronic right shoulder pain  Muscle  weakness (generalized)     Problem List Patient Active Problem List   Diagnosis Date Noted  . Abnormal liver ultrasound 07/29/2019  . Chronic RUQ pain 07/25/2019  . Chronic right shoulder pain 07/25/2019  . Rotator cuff tendonitis, right 06/18/2019  . Greater trochanteric bursitis of left hip 06/18/2019  . Chronic right-sided low back pain with right-sided sciatica 07/05/2018  . Forgetfulness 12/05/2016  . Headache 04/18/2016  . History of laparoscopic adjustable gastric banding, APS, 02/01/2010+ HH repair 07/10/2012    Lazarus Gowda, PT, DPT 09/03/19 11:59 AM  [facility] [address] [place], [place], [address] Phone: [phone]   Fax:  [phone]  Name: Tiffany Ayers MRN: [id number] Date of Birth: 1976-02-26

## 2019-09-03 NOTE — Patient Instructions (Signed)

## 2019-09-08 ENCOUNTER — Ambulatory Visit: Payer: 59 | Admitting: Physical Therapy

## 2019-09-10 ENCOUNTER — Other Ambulatory Visit: Payer: Self-pay

## 2019-09-10 ENCOUNTER — Ambulatory Visit: Payer: 59 | Attending: Family Medicine | Admitting: Physical Therapy

## 2019-09-10 ENCOUNTER — Encounter: Payer: Self-pay | Admitting: Physical Therapy

## 2019-09-10 DIAGNOSIS — M25511 Pain in right shoulder: Secondary | ICD-10-CM | POA: Diagnosis present

## 2019-09-10 DIAGNOSIS — M6281 Muscle weakness (generalized): Secondary | ICD-10-CM | POA: Insufficient documentation

## 2019-09-10 DIAGNOSIS — G8929 Other chronic pain: Secondary | ICD-10-CM | POA: Diagnosis present

## 2019-09-10 NOTE — Patient Instructions (Signed)
Posterior Capsule Sleeper Stretch, Side-Lying    Lie on side, pillow under head, neck in neutral, underside arm in 90-90 of shoulder and elbow flexion with scapula fixed to table. Use other hand to press back of underside arm forward and downward. Keep elbow angle. Hold _15__ seconds.  Repeat _5__ times per session. Do _1__ sessions per day. Advanced: Use PNF contract-relax method.  Copyright  [employer]. All rights reserved.  Posterior Capsule Stretch    Stand or sit, one arm across body so hand rests over opposite shoulder. Gently push on crossed elbow with other hand until stretch is felt in shoulder of crossed arm. Hold __30_ seconds.  Repeat _5__ times per session. Do __1_ sessions per day.  Copyright  [employer]. All rights reserved.

## 2019-09-10 NOTE — Therapy (Signed)
[place], [place], [address] Phone: [phone]   Fax:  [phone]  Physical Therapy Treatment  Patient Details  Name: Tiffany Ayers MRN: [id number] Date of Birth: 14-Oct-1975 Referring Provider (PT): Mack Hook, NP   Encounter Date: 09/10/2019  PT End of Session - 09/10/19 1152    Visit Number  41    Number of Visits  12    Date for PT Re-Evaluation  10/15/19    Authorization Type  [employer]    PT Start Time  0919    PT Stop Time  1003    PT Time Calculation (min)  44 min    Activity Tolerance  Patient tolerated treatment well    Behavior During Therapy  [facility] for tasks assessed/performed       Past Medical History:  Diagnosis Date  . Abnormal liver ultrasound 07/29/2019  . Anti-phospholipid antibody syndrome (HCC)   . Medical history non-contributory   . 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 S/P cesarean section 08/21/2011    Past Surgical History:  Procedure Laterality Date  . CESAREAN SECTION  2004  . CESAREAN SECTION  08/21/2011   Procedure: CESAREAN SECTION;  Surgeon: Thornell Sartorius, MD;  Location: [facility];  Service: Gynecology;  Laterality: N/A;  . CESAREAN SECTION WITH BILATERAL TUBAL LIGATION Bilateral 02/01/2013   Procedure: REPEAT CESAREAN SECTION WITH BILATERAL TUBAL LIGATION;  Surgeon: Mora Bellman, MD;  Location: [facility];  Service: Obstetrics;  Laterality: Bilateral;  . KNEE ARTHROSCOPY  06/05/1998  . LAPAROSCOPIC GASTRIC BANDING    . TONSILLECTOMY    . TONSILLECTOMY      There were no vitals filed for this visit.  Subjective Assessment - 09/10/19 0920    Subjective  It only hurts with repetitive motion.  Not nearly as much as wheni came.  DN helped.  Pain is more centralized.    Currently in Pain?  No/denies      [facility] Adult PT Treatment/Exercise - 09/10/19 0001      Shoulder Exercises: Supine   Protraction  Strengthening;Right;10 reps    Protraction Weight (lbs)  2     Horizontal ABduction  Strengthening;Both;20 reps    Theraband Level (Shoulder Horizontal ABduction)  --    Horizontal ABduction Weight (lbs)  2 lbs single arm     External Rotation  Strengthening;Both;20 reps;Theraband    Theraband Level (Shoulder External Rotation)  Level 3 (Green)    Flexion  Strengthening;Both;10 reps    Diagonals  Strengthening;Right;12 reps    Theraband Level (Shoulder Diagonals)  Level 2 (Red)    Other Supine Exercises  all above exercises done on a foam roller as tolerated       Shoulder Exercises: Sidelying   External Rotation  AROM;Strengthening;Right;15 reps    External Rotation Weight (lbs)  2x 2 x 15     Flexion  Strengthening    Flexion Weight (lbs)  3    ABduction  Strengthening;Right;10 reps    ABduction Weight (lbs)  3      Shoulder Exercises: ROM/Strengthening   UBE (Upper Arm Bike)  5 min L1 in reverse      Shoulder Exercises: Stretch   Other Shoulder Stretches  post capsule sleeper stretch Rt x 5       Manual Therapy   Manual Therapy  Soft tissue mobilization;Passive ROM    Soft tissue mobilization  Right posterior shoulder/periscapular region and deltoid      Neck Exercises: Stretches   Upper Trapezius Stretch  2 reps;30 seconds    Levator Stretch  2 reps;30 seconds    Lower Cervical/Upper Thoracic Stretch  5 reps;10 seconds             PT Education - 09/10/19 1150    Education Details  core    Person(s) Educated  Patient    Methods  Explanation;Demonstration    Comprehension  Verbalized understanding;Returned demonstration          PT Long Term Goals - 09/10/19 1153      PT LONG TERM GOAL #1   Title  Pt will be able to show Independence with HEP for Rt UE strength, posture    Status  On-going      PT LONG TERM GOAL #2   Title  Pt will be able to lift Rt UE through AROM and report no increase in pain (flexion and abduction)    Baseline  improving    Status  On-going      PT LONG TERM GOAL #3   Title  Pt will demo Rt  UE strength to 5/5 in flexion and abduction to maximize Rt UE function for work.    Status  Unable to assess      PT LONG TERM GOAL #4   Title  Pt will be able to lie on Rt shoulder at night (2-3 hours) with only min pain    Baseline  unable to do at all , pain can be 6/10    Status  On-going            Plan - 09/10/19 1154    Clinical Impression Statement  Pt benefitted from dry needling.  She tolerates strengthening exercises well, had min increase in pain from her baseline.  Opted not to do patch today, soft tissue to upper trap as she reports pain extending up into her neck when lifting or overworking upper body.    PT Treatment/Interventions  ADLs/Self Care Home Management;Electrical Stimulation;Ultrasound;Moist Heat;Iontophoresis 4mg /ml Dexamethasone;Functional mobility training;Therapeutic activities;Neuromuscular re-education;Therapeutic exercise;Patient/family education;Manual techniques;Dry needling;Taping    PT Next Visit Plan  repeat dry needling, further ionto/US, manual, progress exercises as tolerated    PT Home Exercise Plan  supine scapular stab with green/red band, sleeper stretch    Consulted and Agree with Plan of Care  Patient       Patient will benefit from skilled therapeutic intervention in order to improve the following deficits and impairments:  Decreased strength, Increased fascial restricitons, Decreased mobility, Postural dysfunction, Pain, Improper body mechanics, Impaired UE functional use  Visit Diagnosis: Chronic right shoulder pain  Muscle weakness (generalized)     Problem List Patient Active Problem List   Diagnosis Date Noted  . Abnormal liver ultrasound 07/29/2019  . Chronic RUQ pain 07/25/2019  . Chronic right shoulder pain 07/25/2019  . Rotator cuff tendonitis, right 06/18/2019  . Greater trochanteric bursitis of left hip 06/18/2019  . Chronic right-sided low back pain with right-sided sciatica 07/05/2018  . Forgetfulness 12/05/2016   . Headache 04/18/2016  . History of laparoscopic adjustable gastric banding, APS, 02/01/2010+ HH repair 07/10/2012    Jasira Robinson 09/10/2019, 11:59 AM  [facility] [address] [place], [place], [place] Phone: [phone]   Fax:  [phone]  Name: Tiffany Ayers MRN: Ronne Binning Date of Birth: 1975/06/29  02/09/1976, PT 09/10/19 11:59 AM Phone: [phone] Fax: [phone]

## 2019-09-15 ENCOUNTER — Ambulatory Visit: Payer: 59 | Admitting: Physical Therapy

## 2019-09-15 ENCOUNTER — Other Ambulatory Visit: Payer: Self-pay

## 2019-09-15 ENCOUNTER — Encounter: Payer: Self-pay | Admitting: Physical Therapy

## 2019-09-15 DIAGNOSIS — G8929 Other chronic pain: Secondary | ICD-10-CM

## 2019-09-15 DIAGNOSIS — M6281 Muscle weakness (generalized): Secondary | ICD-10-CM

## 2019-09-15 DIAGNOSIS — M25511 Pain in right shoulder: Secondary | ICD-10-CM | POA: Diagnosis not present

## 2019-09-15 NOTE — Therapy (Signed)
[facility] [address] [place], [place], [address] Phone: [phone]   Fax:  [phone]  Physical Therapy Treatment  Patient Details  Name: Tiffany Ayers MRN: [id number] Date of Birth: [birth year] Referring Provider (PT): Beather Arbour, NP   Encounter Date: 09/15/2019  PT End of Session - 09/15/19 1014    Visit Number  5    Number of Visits  12    Date for PT Re-Evaluation  10/15/19    Authorization Type  [employer]    PT Start Time  1008   pt 5 min late   Activity Tolerance  Patient tolerated treatment well    Behavior During Therapy  [facility] for tasks assessed/performed       Past Medical History:  Diagnosis Date  . Abnormal liver ultrasound 07/29/2019  . Anti-phospholipid antibody syndrome (HCC)   . Medical history non-contributory   . 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 S/P cesarean section 08/21/2011    Past Surgical History:  Procedure Laterality Date  . CESAREAN SECTION  2004  . CESAREAN SECTION  08/21/2011   Procedure: CESAREAN SECTION;  Surgeon: Sherron Monday, MD;  Location: [facility];  Service: Gynecology;  Laterality: N/A;  . CESAREAN SECTION WITH BILATERAL TUBAL LIGATION Bilateral 02/01/2013   Procedure: REPEAT CESAREAN SECTION WITH BILATERAL TUBAL LIGATION;  Surgeon: Catalina Antigua, MD;  Location: [facility];  Service: Obstetrics;  Laterality: Bilateral;  . KNEE ARTHROSCOPY  06/05/1998  . LAPAROSCOPIC GASTRIC BANDING    . TONSILLECTOMY    . TONSILLECTOMY      There were no vitals filed for this visit.  Subjective Assessment - 09/15/19 1011    Subjective  Did alot of gardening so it was really sore all weekend.  Still mostly at night.  no pain right this minute.    Currently in Pain?  No/denies    Pain Score  --   was 7/10 at night   Pain Location  Shoulder    Pain Orientation  Right;Anterior;Proximal    Pain Descriptors / Indicators  Sore    Pain Onset  More than a month ago    Pain Frequency  Intermittent     Aggravating Factors   laying on it, reaching and lifting (flexion)    Pain Relieving Factors  Advil, also has hip pain                       [facility] Adult PT Treatment/Exercise - 09/15/19 0001      Pilates   Pilates Reformer  see note          Pilates Reformer used for LE/core strength, postural strength, lumbopelvic disassociation and core control.  Exercises included:   Overhead press 1 Red double arm and single arm, 1 red 1 yellow another set , tactile cues.  Swan prep 1 Red x 5   Supine Arm work 1 red 1 yellow Arcs, then modified for Rt shoulder discomfort   1 red abduction x 10 , then triceps x 10  Seated Arms 1 blue extension (had to cross legs for comfort) x 15 cues for upper  trap   1 blue horizontal abduction x 15  1 set elbows extended 1 set goal post   Rotation 1 arm on 1 Red x 10            PT Long Term Goals - 09/10/19 1153      PT LONG TERM GOAL #1   Title  Pt will be  able to show Independence with HEP for Rt UE strength, posture    Status  On-going      PT LONG TERM GOAL #2   Title  Pt will be able to lift Rt UE through AROM and report no increase in pain (flexion and abduction)    Baseline  improving    Status  On-going      PT LONG TERM GOAL #3   Title  Pt will demo Rt UE strength to 5/5 in flexion and abduction to maximize Rt UE function for work.    Status  Unable to assess      PT LONG TERM GOAL #4   Title  Pt will be able to lie on Rt shoulder at night (2-3 hours) with only min pain    Baseline  unable to do at all , pain can be 6/10    Status  On-going              Patient will benefit from skilled therapeutic intervention in order to improve the following deficits and impairments:     Visit Diagnosis: Chronic right shoulder pain  Muscle weakness (generalized)     Problem List Patient Active Problem List   Diagnosis Date Noted  . Abnormal liver ultrasound 07/29/2019  . Chronic RUQ pain 07/25/2019  .  Chronic right shoulder pain 07/25/2019  . Rotator cuff tendonitis, right 06/18/2019  . Greater trochanteric bursitis of left hip 06/18/2019  . Chronic right-sided low back pain with right-sided sciatica 07/05/2018  . Forgetfulness 12/05/2016  . Headache 04/18/2016  . History of laparoscopic adjustable gastric banding, APS, 02/01/2010+ HH repair 07/10/2012    , 09/15/2019, 10:27 AM  [place], [place], [address] Phone: [phone]   Fax:  [phone]  Name: Tiffany Ayers MRN: [id number] Date of Birth: 12-10-1975   Raeford Razor, PT 09/15/19 11:08 AM Phone: [phone] Fax: [phone]

## 2019-09-17 ENCOUNTER — Ambulatory Visit: Payer: 59 | Admitting: Physical Therapy

## 2019-09-17 ENCOUNTER — Other Ambulatory Visit: Payer: Self-pay

## 2019-09-17 ENCOUNTER — Encounter: Payer: Self-pay | Admitting: Physical Therapy

## 2019-09-17 DIAGNOSIS — M25511 Pain in right shoulder: Secondary | ICD-10-CM | POA: Diagnosis not present

## 2019-09-17 DIAGNOSIS — M6281 Muscle weakness (generalized): Secondary | ICD-10-CM

## 2019-09-17 DIAGNOSIS — G8929 Other chronic pain: Secondary | ICD-10-CM

## 2019-09-17 NOTE — Therapy (Signed)
[facility] [address] [place], [place], [address] Phone: [phone]   Fax:  [phone]  Physical Therapy Treatment  Patient Details  Name: Tiffany Ayers MRN: [id number] Date of Birth: 12/11/1975 Referring Provider (PT): Beather Arbour, NP   Encounter Date: 09/17/2019  PT End of Session - 09/17/19 1211    Visit Number  6    Number of Visits  12    Date for PT Re-Evaluation  10/15/19    Authorization Type  [employer]    PT Start Time  1004    PT Stop Time  1045    PT Time Calculation (min)  41 min    Activity Tolerance  Patient tolerated treatment well    Behavior During Therapy  [facility] for tasks assessed/performed       Past Medical History:  Diagnosis Date  . Abnormal liver ultrasound 07/29/2019  . Anti-phospholipid antibody syndrome (HCC)   . Medical history non-contributory   . 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 S/P cesarean section 08/21/2011    Past Surgical History:  Procedure Laterality Date  . CESAREAN SECTION  2004  . CESAREAN SECTION  08/21/2011   Procedure: CESAREAN SECTION;  Surgeon: Sherron Monday, MD;  Location: [facility];  Service: Gynecology;  Laterality: N/A;  . CESAREAN SECTION WITH BILATERAL TUBAL LIGATION Bilateral 02/01/2013   Procedure: REPEAT CESAREAN SECTION WITH BILATERAL TUBAL LIGATION;  Surgeon: Catalina Antigua, MD;  Location: [facility];  Service: Obstetrics;  Laterality: Bilateral;  . KNEE ARTHROSCOPY  06/05/1998  . LAPAROSCOPIC GASTRIC BANDING    . TONSILLECTOMY    . TONSILLECTOMY      There were no vitals filed for this visit.  Subjective Assessment - 09/17/19 1006    Subjective  Good today, sore from exercises.  Discomfort in triceps.    Currently in Pain?  No/denies          [facility] Adult PT Treatment/Exercise - 09/17/19 0001      Shoulder Exercises: Supine   Other Supine Exercises  supine foam roller: circle press up x 10, overhead lift x 15, knee lifts x 10, added arms x 10 each for  dead bug     Other Supine Exercises  SLR with UE flexion x 5 each side         Pilates Reformer used for LE/core strength, postural strength, lumbopelvic disassociation and core control.  Exercises included:   Seated Arms 1 Red row, roll down x 10, then row in a partial curl x 10  Extension x 10 palms back then palms forward x 10 , horizontal abd 1 blue  Seated arms facing forward: Serving x 10 , 1 blue  Hug a tree 1 blue x 10  Short box core , planks x 10   And press out with shoulders 1 Red spring, 8 reps, close supervision.      PT Long Term Goals - 09/10/19 1153      PT LONG TERM GOAL #1   Title  Pt will be able to show Independence with HEP for Rt UE strength, posture    Status  On-going      PT LONG TERM GOAL #2   Title  Pt will be able to lift Rt UE through AROM and report no increase in pain (flexion and abduction)    Baseline  improving    Status  On-going      PT LONG TERM GOAL #3   Title  Pt will demo Rt UE strength to  5/5 in flexion and abduction to maximize Rt UE function for work.    Status  Unable to assess      PT LONG TERM GOAL #4   Title  Pt will be able to lie on Rt shoulder at night (2-3 hours) with only min pain    Baseline  unable to do at all , pain can be 6/10    Status  On-going            Plan - 09/17/19 1008    Clinical Impression Statement  Pt without shoulder pain today, was pleased to be sore in her core and triceps. Discussed overall strengthening as a remedy for her back, hip pain as well. She may be calling to get a referral for her hip pain as she cont with shoulder  PT.    PT Treatment/Interventions  ADLs/Self Care Home Management;Electrical Stimulation;Ultrasound;Moist Heat;Iontophoresis 4mg /ml Dexamethasone;Functional mobility training;Therapeutic activities;Neuromuscular re-education;Therapeutic exercise;Patient/family education;Manual techniques;Dry needling;Taping    PT Next Visit Plan  repeat dry needling, upgrade HEP?> further  ionto/US, manual, progress exercises as tolerated    PT Home Exercise Plan  supine scapular stab with green/red band, sleeper stretch    Consulted and Agree with Plan of Care  Patient       Patient will benefit from skilled therapeutic intervention in order to improve the following deficits and impairments:  Decreased strength, Increased fascial restricitons, Decreased mobility, Postural dysfunction, Pain, Improper body mechanics, Impaired UE functional use  Visit Diagnosis: Chronic right shoulder pain  Muscle weakness (generalized)     Problem List Patient Active Problem List   Diagnosis Date Noted  . Abnormal liver ultrasound 07/29/2019  . Chronic RUQ pain 07/25/2019  . Chronic right shoulder pain 07/25/2019  . Rotator cuff tendonitis, right 06/18/2019  . Greater trochanteric bursitis of left hip 06/18/2019  . Chronic right-sided low back pain with right-sided sciatica 07/05/2018  . Forgetfulness 12/05/2016  . Headache 04/18/2016  . 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

## 2019-09-23 ENCOUNTER — Other Ambulatory Visit: Payer: Self-pay

## 2019-09-23 ENCOUNTER — Ambulatory Visit: Payer: 59 | Admitting: Physical Therapy

## 2019-09-23 ENCOUNTER — Telehealth: Payer: Self-pay | Admitting: Family Medicine

## 2019-09-23 DIAGNOSIS — M25511 Pain in right shoulder: Secondary | ICD-10-CM | POA: Diagnosis not present

## 2019-09-23 DIAGNOSIS — G8929 Other chronic pain: Secondary | ICD-10-CM

## 2019-09-23 DIAGNOSIS — Z1231 Encounter for screening mammogram for malignant neoplasm of breast: Secondary | ICD-10-CM

## 2019-09-23 DIAGNOSIS — M6281 Muscle weakness (generalized): Secondary | ICD-10-CM

## 2019-09-23 NOTE — Therapy (Signed)
[place], [place], [address] Phone: [phone]   Fax:  [phone]  Physical Therapy Treatment  Patient Details  Name: Tiffany Ayers MRN: [id number] Date of Birth: 1976-01-07 Referring Provider (PT): Mack Hook, NP   Encounter Date: 09/23/2019  PT End of Session - 09/23/19 1313    Visit Number  7    Number of Visits  12    Date for PT Re-Evaluation  10/15/19    Authorization Type  [employer]    PT Start Time  1005    PT Stop Time  1045    PT Time Calculation (min)  40 min    Activity Tolerance  Patient tolerated treatment well    Behavior During Therapy  [facility] for tasks assessed/performed       Past Medical History:  Diagnosis Date  . Abnormal liver ultrasound 07/29/2019  . Anti-phospholipid antibody syndrome (HCC)   . Medical history non-contributory   . 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 S/P cesarean section 08/21/2011    Past Surgical History:  Procedure Laterality Date  . CESAREAN SECTION  2004  . CESAREAN SECTION  08/21/2011   Procedure: CESAREAN SECTION;  Surgeon: Thornell Sartorius, MD;  Location: [place] [facility];  Service: Gynecology;  Laterality: N/A;  . CESAREAN SECTION WITH BILATERAL TUBAL LIGATION Bilateral 02/01/2013   Procedure: REPEAT CESAREAN SECTION WITH BILATERAL TUBAL LIGATION;  Surgeon: Mora Bellman, MD;  Location: [place] [facility];  Service: Obstetrics;  Laterality: Bilateral;  . KNEE ARTHROSCOPY  06/05/1998  . LAPAROSCOPIC GASTRIC BANDING    . TONSILLECTOMY    . TONSILLECTOMY      There were no vitals filed for this visit.  Subjective Assessment - 09/23/19 1019    Subjective  Shoulder is good, neck is tight.  Did a wedding ths weekend and was actually not having any pain in shoulder.        [facility] PT Treatment/Exercise - 09/23/19 0001      Shoulder Exercises: Supine   Horizontal ABduction  Strengthening;Both;10 reps    Horizontal ABduction Weight (lbs)  2    Flexion   Strengthening;Both;10 reps    Shoulder Flexion Weight (lbs)  2 , shorten ROM     Other Supine Exercises  chin tuck, ball rolls for C spine ,    Other Supine Exercises  single arm antirotation  5 lbs wgt.  Mod  ROM for Rt UE x 10 each side       Shoulder Exercises: Standing   Other Standing Exercises  standing scapular/postural strength- retraction, W, wall angel goal post , ER x 15 each       Shoulder Exercises: ROM/Strengthening   Nustep  5 min L6 UE and LE for 23min       Manual Therapy   Manual Therapy  Passive ROM;Manual Traction    Manual therapy comments       Soft tissue mobilization  Rt upper trap, levator scap and scalenes     Passive ROM  cervical ROM  rotation and sidebending     Manual Traction  suboccipital release x 5                   PT Long Term Goals - 09/10/19 1153      PT LONG TERM GOAL #1   Title  Pt will be able to show Independence with HEP for Rt UE strength, posture    Status  On-going      PT LONG TERM  GOAL #2   Title  Pt will be able to lift Rt UE through AROM and report no increase in pain (flexion and abduction)    Baseline  improving    Status  On-going      PT LONG TERM GOAL #3   Title  Pt will demo Rt UE strength to 5/5 in flexion and abduction to maximize Rt UE function for work.    Status  Unable to assess      PT LONG TERM GOAL #4   Title  Pt will be able to lie on Rt shoulder at night (2-3 hours) with only min pain    Baseline  unable to do at all , pain can be 6/10    Status  On-going            Plan - 09/23/19 1043    Clinical Impression Statement  Pain focused today more on Rt upper trap, none in shoulder unless going overhead in supine. She used camera this weekend and reports it is not heavy, perhaps pressure from straps increased tension in neck. EMphasis placed on posture today and stabiization with UE motions.  Reduced stiffness post session.    PT Treatment/Interventions  ADLs/Self Care Home Management;Electrical  Stimulation;Ultrasound;Moist Heat;Iontophoresis 4mg /ml Dexamethasone;Functional mobility training;Therapeutic activities;Neuromuscular re-education;Therapeutic exercise;Patient/family education;Manual techniques;Dry needling;Taping    PT Next Visit Plan  repeat dry needling, further ionto/US, manual, progress exercises as tolerated, reformer. FOTO    PT Home Exercise Plan  supine /standing scapular stab with green/red band, sleeper stretch    Consulted and Agree with Plan of Care  Patient       Patient will benefit from skilled therapeutic intervention in order to improve the following deficits and impairments:  Decreased strength, Increased fascial restricitons, Decreased mobility, Postural dysfunction, Pain, Improper body mechanics, Impaired UE functional use  Visit Diagnosis: Chronic right shoulder pain  Muscle weakness (generalized)     Problem List Patient Active Problem List   Diagnosis Date Noted  . Abnormal liver ultrasound 07/29/2019  . Chronic RUQ pain 07/25/2019  . Chronic right shoulder pain 07/25/2019  . Rotator cuff tendonitis, right 06/18/2019  . Greater trochanteric bursitis of left hip 06/18/2019  . Chronic right-sided low back pain with right-sided sciatica 07/05/2018  . Forgetfulness 12/05/2016  . Headache 04/18/2016  . History of laparoscopic adjustable gastric banding, APS, 02/01/2010+ HH repair 07/10/2012    Chuong Casebeer 09/23/2019, 1:14 PM  [facility] [address] [place], [place], [place] Phone: [phone]   Fax:  [phone]  Name: SRESHTA CRESSLER MRN: Ronne Binning Date of Birth: March 10, 1976  02/09/1976, PT 09/23/19 1:14 PM Phone: [phone] Fax: [phone]

## 2019-09-23 NOTE — Telephone Encounter (Signed)
Pt called and wanted to see if she could get a referral to have get a mammogram done.

## 2019-09-23 NOTE — Telephone Encounter (Signed)
Ok to order mammogram for breast cancer screening. If she has a problem or concern then we will need to see her before ordering.

## 2019-09-24 NOTE — Telephone Encounter (Signed)
No concerns, she is just at the age needing a referral

## 2019-09-25 ENCOUNTER — Ambulatory Visit: Payer: 59 | Admitting: Physical Therapy

## 2019-09-25 ENCOUNTER — Other Ambulatory Visit: Payer: Self-pay

## 2019-09-25 ENCOUNTER — Encounter: Payer: Self-pay | Admitting: Physical Therapy

## 2019-09-25 DIAGNOSIS — M25511 Pain in right shoulder: Secondary | ICD-10-CM | POA: Diagnosis not present

## 2019-09-25 DIAGNOSIS — G8929 Other chronic pain: Secondary | ICD-10-CM

## 2019-09-25 DIAGNOSIS — M6281 Muscle weakness (generalized): Secondary | ICD-10-CM

## 2019-09-25 NOTE — Therapy (Signed)
[facility] [address] [place], [place], [address] Phone: [phone]   Fax:  [phone]  Physical Therapy Treatment  Patient Details  Name: Tiffany Ayers MRN: [id number] Date of Birth: 03-May-1976 Referring Provider (PT): Beather Arbour, NP   Encounter Date: 09/25/2019  PT End of Session - 09/25/19 1114    Visit Number  8    Number of Visits  12    Date for PT Re-Evaluation  10/15/19    Authorization Type  [employer]    PT Start Time  1022   arrive late due to traffic/construction   PT Stop Time  1105    PT Time Calculation (min)  43 min    Activity Tolerance  Patient tolerated treatment well    Behavior During Therapy  [facility] for tasks assessed/performed       Past Medical History:  Diagnosis Date  . Abnormal liver ultrasound 07/29/2019  . Anti-phospholipid antibody syndrome (HCC)   . Medical history non-contributory   . 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 S/P cesarean section 08/21/2011    Past Surgical History:  Procedure Laterality Date  . CESAREAN SECTION  2004  . CESAREAN SECTION  08/21/2011   Procedure: CESAREAN SECTION;  Surgeon: Sherron Monday, MD;  Location: [facility];  Service: Gynecology;  Laterality: N/A;  . CESAREAN SECTION WITH BILATERAL TUBAL LIGATION Bilateral 02/01/2013   Procedure: REPEAT CESAREAN SECTION WITH BILATERAL TUBAL LIGATION;  Surgeon: Catalina Antigua, MD;  Location: [facility];  Service: Obstetrics;  Laterality: Bilateral;  . KNEE ARTHROSCOPY  06/05/1998  . LAPAROSCOPIC GASTRIC BANDING    . TONSILLECTOMY    . TONSILLECTOMY      There were no vitals filed for this visit.  Subjective Assessment - 09/25/19 1111    Subjective  Still noting tightness in right cervical/upper trapezius region and also noting some pectoral region soreness. Right shoulder pain is mainly with activity/reaching.                       [facility] Adult PT Treatment/Exercise - 09/25/19 0001      Manual Therapy   Soft tissue mobilization  STM right upper trapezius region in sitting, STM in supine right anterior deltoid and superior lateral portion of pec major, active release pec maojor with unilat. "chest press" AROM 2x10      Neck Exercises: Stretches   Upper Trapezius Stretch  Right;3 reps;30 seconds   supine manual stretch   Corner Stretch  --   HEP instruction corner vs. doorway pec stretch   Chest Stretch  --   supine manual pec major and minor stretches 3x20 sec      Trigger Point Dry Needling - 09/25/19 0001    Consent Given?  Yes    Muscles Treated Head and Neck  Upper trapezius    Muscles Treated Upper Quadrant  Infraspinatus;Deltoid    Dry Needling Comments  needling in left sidelying with 32 gauge 30 and 50 mm needles    Electrical Stimulation Performed with Dry Needling  Yes    E-stim with Dry Needling Details  TENS 20 pps x 10 minutes           PT Education - 09/25/19 1114    Education Details  HEP, posture    Person(s) Educated  Patient    Methods  Explanation;Verbal cues;Demonstration    Comprehension  Verbalized understanding          PT Long Term Goals - 09/10/19 1153  PT LONG TERM GOAL #1   Title  Pt will be able to show Independence with HEP for Rt UE strength, posture    Status  On-going      PT LONG TERM GOAL #2   Title  Pt will be able to lift Rt UE through AROM and report no increase in pain (flexion and abduction)    Baseline  improving    Status  On-going      PT LONG TERM GOAL #3   Title  Pt will demo Rt UE strength to 5/5 in flexion and abduction to maximize Rt UE function for work.    Status  Unable to assess      PT LONG TERM GOAL #4   Title  Pt will be able to lie on Rt shoulder at night (2-3 hours) with only min pain    Baseline  unable to do at all , pain can be 6/10    Status  On-going            Plan - 09/25/19 1115    Clinical Impression Statement  Tx. focus manual and stretches to address cervical/upper trapezius region  and pectoral tightness along with dry needling to address myofascial trigger point pain. Suspect pec tightness contributing to rounding of shoulders and subsequent upper trap region tightness. Brief HEP review stretches to help address at home. Pt. would benefit from continued therapy for further progress to address functional limitations.    Examination-Activity Limitations  Reach Overhead;Lift;Sleep;Carry    Stability/Clinical Decision Making  Stable/Uncomplicated    Clinical Decision Making  Low    PT Frequency  2x / week    PT Duration  6 weeks    PT Treatment/Interventions  ADLs/Self Care Home Management;Electrical Stimulation;Ultrasound;Moist Heat;Iontophoresis 4mg /ml Dexamethasone;Functional mobility training;Therapeutic activities;Neuromuscular re-education;Therapeutic exercise;Patient/family education;Manual techniques;Dry needling;Taping    PT Next Visit Plan  repeat dry needling, further ionto/US, manual, progress exercises as tolerated, reformer. FOTO    PT Home Exercise Plan  supine /standing scapular stab with green/red band, sleeper stretch    Consulted and Agree with Plan of Care  Patient       Patient will benefit from skilled therapeutic intervention in order to improve the following deficits and impairments:  Decreased strength, Increased fascial restricitons, Decreased mobility, Postural dysfunction, Pain, Improper body mechanics, Impaired UE functional use  Visit Diagnosis: Chronic right shoulder pain  Muscle weakness (generalized)     Problem List Patient Active Problem List   Diagnosis Date Noted  . Abnormal liver ultrasound 07/29/2019  . Chronic RUQ pain 07/25/2019  . Chronic right shoulder pain 07/25/2019  . Rotator cuff tendonitis, right 06/18/2019  . Greater trochanteric bursitis of left hip 06/18/2019  . Chronic right-sided low back pain with right-sided sciatica 07/05/2018  . Forgetfulness 12/05/2016  . Headache 04/18/2016  . History of laparoscopic  adjustable gastric banding, APS, 02/01/2010+ HH repair 07/10/2012    Beaulah Dinning, PT, DPT 09/25/19 11:18 AM  [facility] [address] [place], [place], [address] Phone: [phone]   Fax:  [phone]  Name: Tiffany Ayers MRN: [id number] Date of Birth: 1975/07/10

## 2019-09-26 ENCOUNTER — Ambulatory Visit
Admission: RE | Admit: 2019-09-26 | Discharge: 2019-09-26 | Disposition: A | Discharge status: Home / Self Care | Payer: 59 | Source: Ambulatory Visit | Attending: Family Medicine | Admitting: Family Medicine

## 2019-09-26 DIAGNOSIS — Z1231 Encounter for screening mammogram for malignant neoplasm of breast: Secondary | ICD-10-CM

## 2019-09-29 ENCOUNTER — Other Ambulatory Visit: Payer: Self-pay | Admitting: Family Medicine

## 2019-09-29 DIAGNOSIS — R928 Other abnormal and inconclusive findings on diagnostic imaging of breast: Secondary | ICD-10-CM

## 2019-09-30 ENCOUNTER — Other Ambulatory Visit: Payer: Self-pay

## 2019-09-30 ENCOUNTER — Ambulatory Visit: Payer: 59 | Admitting: Physical Therapy

## 2019-09-30 ENCOUNTER — Encounter: Payer: Self-pay | Admitting: Physical Therapy

## 2019-09-30 DIAGNOSIS — M25511 Pain in right shoulder: Secondary | ICD-10-CM | POA: Diagnosis not present

## 2019-09-30 DIAGNOSIS — M6281 Muscle weakness (generalized): Secondary | ICD-10-CM

## 2019-09-30 DIAGNOSIS — G8929 Other chronic pain: Secondary | ICD-10-CM

## 2019-09-30 NOTE — Therapy (Signed)
[place], [place], [address] Phone: [phone]   Fax:  [phone]  Physical Therapy Treatment/Re-Evaluation  Patient Details  Name: Tiffany Ayers MRN: [id number] Date of Birth: 1975/08/27 Referring Provider (PT): Mack Hook, NP   Encounter Date: 09/30/2019  PT End of Session - 09/30/19 1016    Visit Number  9    Number of Visits  17    Date for PT Re-Evaluation  11/25/19    Authorization Type  [employer]    PT Start Time  1000    PT Stop Time  1110    PT Time Calculation (min)  70 min    Activity Tolerance  Patient tolerated treatment well    Behavior During Therapy  [facility] for tasks assessed/performed       Past Medical History:  Diagnosis Date  . Abnormal liver ultrasound 07/29/2019  . Anti-phospholipid antibody syndrome (HCC)   . Medical history non-contributory   . 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 S/P cesarean section 08/21/2011    Past Surgical History:  Procedure Laterality Date  . CESAREAN SECTION  2004  . CESAREAN SECTION  08/21/2011   Procedure: CESAREAN SECTION;  Surgeon: Thornell Sartorius, MD;  Location: [facility];  Service: Gynecology;  Laterality: N/A;  . CESAREAN SECTION WITH BILATERAL TUBAL LIGATION Bilateral 02/01/2013   Procedure: REPEAT CESAREAN SECTION WITH BILATERAL TUBAL LIGATION;  Surgeon: Mora Bellman, MD;  Location: [facility];  Service: Obstetrics;  Laterality: Bilateral;  . KNEE ARTHROSCOPY  06/05/1998  . LAPAROSCOPIC GASTRIC BANDING    . TONSILLECTOMY    . TONSILLECTOMY      There were no vitals filed for this visit.  Subjective Assessment - 09/30/19 1011    Subjective  Shoulder is the same.  Neck pain is moderate. I can hold my bag without shoudler pain anymore.    Currently in Pain?  Yes    Pain Score  5     Pain Location  Neck    Pain Orientation  Right;Lateral    Pain Descriptors / Indicators  Tightness    Pain Type  Chronic pain    Pain Onset  More than a month  ago    Pain Frequency  Intermittent            [facility] Adult PT Treatment/Exercise - 09/30/19 0001      Self-Care   Self-Care  Posture;Heat/Ice Application;Other Self-Care Comments    Posture  standing, chin tuck, head position in Q ped     Heat/Ice Application  heat vs ice     Other Self-Care Comments   HEP, POC       Neck Exercises: Stabilization   Stabilization  Deep neck flexor strength , 10 sec       Shoulder Exercises: Supine   Horizontal ABduction  Strengthening;Both;10 reps    Theraband Level (Shoulder Horizontal ABduction)  Level 3 (Green)    Shoulder Flexion Weight (lbs)  narrow grip green band with chin tuck x 10     Diagonals  Strengthening;Both;10 reps    Theraband Level (Shoulder Diagonals)  Level 3 (Green)      Shoulder Exercises: ROM/Strengthening   Other ROM/Strengthening Exercises  quadruped alt. arms x 10 added legs for bird dog , 5sec hold x 10       Shoulder Exercises: Stretch   Other Shoulder Stretches  sleeper stretch R side x 3       Moist Heat Therapy   Number Minutes Moist Heat  10 Minutes    Moist Heat Location  Shoulder;Cervical      Manual Therapy   Soft tissue mobilization  Rt upper trap, levator scap and scalenes     Passive ROM  cervical ROM  rotation and sidebending     Manual Traction  suboccipital release x 5       Neck Exercises: Stretches   Levator Stretch  2 reps;30 seconds    Other Neck Stretches  upper trap x 2, 30 sec              PT Education - 09/30/19 1050    Education Details  streamlined HEP, posture    Person(s) Educated  Patient    Methods  Explanation;Demonstration;Tactile cues;Verbal cues;Handout    Comprehension  Verbalized understanding;Returned demonstration          PT Long Term Goals - 09/30/19 1051      PT LONG TERM GOAL #1   Title  Pt will be able to show Independence with HEP for Rt UE strength, posture    Baseline  updated today    Status  Partially Met    Target Date  11/25/19      PT  LONG TERM GOAL #2   Title  Pt will be able to lift Rt UE through AROM and report no increase in pain (flexion and abduction)    Baseline  pain anterior shoulder with lifting    Status  On-going    Target Date  11/25/19      PT LONG TERM GOAL #3   Title  Pt will demo Rt UE strength to 5/5 in flexion and abduction to maximize Rt UE function for work.    Baseline  4/5, painful Rt UE    Status  On-going    Target Date  11/25/19      PT LONG TERM GOAL #4   Title  Pt will be able to lie on Rt shoulder at night (2-3 hours) with only min pain    Baseline  can do about an hour , min pain    Status  On-going    Target Date  11/25/19      PT LONG TERM GOAL #5   Title  Pt will be able to demo AROM in C spine without increased pain.    Baseline  pain with sidebending and rotation    Time  8    Period  Weeks    Status  New    Target Date  11/25/19            Plan - 09/30/19 1114    Clinical Impression Statement  Patient renewed for further PT to address pain in Rt UE and Rt lateral neck which is resolving.  She is improved in her ability to lift Rt arm, carry bag and camera.  Neck pain has been more of an issue, likely related to poor posture and weakness in shoulder girdle, poor alignment. She has an updated HEP.  Cross body reaching also painful.  Will benefit from skilled PT to maximize functional use of Rt UE for work and recreation.    Examination-Activity Limitations  Reach Overhead;Lift;Sleep;Carry    Stability/Clinical Decision Making  Stable/Uncomplicated    Clinical Decision Making  Low    Rehab Potential  Excellent    PT Frequency  1x / week    PT Duration  8 weeks   6-8 visits   PT Treatment/Interventions  ADLs/Self Care Home Management;Electrical Stimulation;Ultrasound;Moist Heat;Iontophoresis '4mg'$ /ml Dexamethasone;Functional mobility  training;Therapeutic activities;Neuromuscular re-education;Therapeutic exercise;Patient/family education;Manual techniques;Dry  needling;Taping;Passive range of motion;Cryotherapy    PT Next Visit Plan  manual, posture and UE strengthening    PT Home Exercise Plan  PMLETLYM: Q-ped UE and bird dog, standing scap on wall, UT and LS stretch    Consulted and Agree with Plan of Care  Patient       Patient will benefit from skilled therapeutic intervention in order to improve the following deficits and impairments:  Decreased strength, Increased fascial restricitons, Decreased mobility, Postural dysfunction, Pain, Improper body mechanics, Impaired UE functional use  Visit Diagnosis: Chronic right shoulder pain  Muscle weakness (generalized)     Problem List Patient Active Problem List   Diagnosis Date Noted  . Abnormal liver ultrasound 07/29/2019  . Chronic RUQ pain 07/25/2019  . Chronic right shoulder pain 07/25/2019  . Rotator cuff tendonitis, right 06/18/2019  . Greater trochanteric bursitis of left hip 06/18/2019  . Chronic right-sided low back pain with right-sided sciatica 07/05/2018  . Forgetfulness 12/05/2016  . Headache 04/18/2016  . History of laparoscopic adjustable gastric banding, APS, 02/01/2010+ HH repair 07/10/2012    Tracie Lindbloom 09/30/2019, 11:21 AM  [place], [place], [address] Phone: [phone]   Fax:  [phone]  Name: MARVIE BREVIK MRN: [id number] Date of Birth: 09-Mar-1976  Raeford Razor, PT 09/30/19 11:21 AM Phone: [phone] Fax: [phone]

## 2019-09-30 NOTE — Patient Instructions (Signed)
Access Code: [email or web address]: [email or web address]: 04/27/2021Prepared by: Victorino Dike PaaExercises  Seated Cervical Sidebending Stretch - 1 x daily - 7 x weekly - 1 sets - 3 reps - 30 hold  Seated Levator Scapulae Stretch - 1 x daily - 7 x weekly - 1 sets - 3 reps - 30 hold  Wall Angels - 1 x daily - 7 x weekly - 2 sets - 10 reps - 30 hold  Standing Shoulder Horizontal Abduction with Resistance - 1 x daily - 7 x weekly - 10 reps - 2 sets - 30 hold  Quadruped Alternating Arm Lift - 1 x daily - 7 x weekly - 2 sets - 10 reps - 5 hold  Bird Dog - 1 x daily - 7 x weekly - 2 sets - 10 reps - 5 hold  Sleeper Stretch - 1 x daily - 7 x weekly - 10 reps - 2 sets - 30 hold

## 2019-10-02 ENCOUNTER — Encounter: Payer: 59 | Admitting: Physical Therapy

## 2019-10-08 ENCOUNTER — Other Ambulatory Visit: Payer: Self-pay | Admitting: Family Medicine

## 2019-10-08 ENCOUNTER — Ambulatory Visit
Admission: RE | Admit: 2019-10-08 | Discharge: 2019-10-08 | Disposition: A | Discharge status: Home / Self Care | Payer: 59 | Source: Ambulatory Visit | Attending: Family Medicine | Admitting: Family Medicine

## 2019-10-08 ENCOUNTER — Other Ambulatory Visit: Payer: Self-pay

## 2019-10-08 DIAGNOSIS — N6310 Unspecified lump in the right breast, unspecified quadrant: Secondary | ICD-10-CM

## 2019-10-08 DIAGNOSIS — R928 Other abnormal and inconclusive findings on diagnostic imaging of breast: Secondary | ICD-10-CM

## 2019-10-09 ENCOUNTER — Ambulatory Visit: Payer: 59 | Admitting: Physical Therapy

## 2019-10-27 ENCOUNTER — Other Ambulatory Visit: Payer: Self-pay

## 2019-10-27 ENCOUNTER — Ambulatory Visit: Payer: 59 | Attending: Family Medicine | Admitting: Physical Therapy

## 2019-10-27 ENCOUNTER — Encounter: Payer: Self-pay | Admitting: Physical Therapy

## 2019-10-27 DIAGNOSIS — M25511 Pain in right shoulder: Secondary | ICD-10-CM | POA: Diagnosis present

## 2019-10-27 DIAGNOSIS — G8929 Other chronic pain: Secondary | ICD-10-CM | POA: Diagnosis present

## 2019-10-27 DIAGNOSIS — M6281 Muscle weakness (generalized): Secondary | ICD-10-CM | POA: Diagnosis present

## 2019-10-27 NOTE — Therapy (Signed)
[place], [place], [address] Phone: [phone]   Fax:  [phone]  Physical Therapy Treatment  Patient Details  Name: Tiffany Ayers MRN: [id number] Date of Birth: [birth year] Referring Provider (PT): Mack Hook, NP   Encounter Date: 10/27/2019  PT End of Session - 10/27/19 1006    Visit Number  10    Number of Visits  17    Date for PT Re-Evaluation  11/25/19    Authorization Type  [employer]    PT Start Time  1005    PT Stop Time  1040    PT Time Calculation (min)  35 min    Activity Tolerance  Patient tolerated treatment well    Behavior During Therapy  [facility] for tasks assessed/performed       Past Medical History:  Diagnosis Date  . Abnormal liver ultrasound 07/29/2019  . Anti-phospholipid antibody syndrome (HCC)   . Medical history non-contributory   . 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 S/P cesarean section 08/21/2011    Past Surgical History:  Procedure Laterality Date  . CESAREAN SECTION  2004  . CESAREAN SECTION  08/21/2011   Procedure: CESAREAN SECTION;  Surgeon: Thornell Sartorius, MD;  Location: [facility];  Service: Gynecology;  Laterality: N/A;  . CESAREAN SECTION WITH BILATERAL TUBAL LIGATION Bilateral 02/01/2013   Procedure: REPEAT CESAREAN SECTION WITH BILATERAL TUBAL LIGATION;  Surgeon: Mora Bellman, MD;  Location: [facility];  Service: Obstetrics;  Laterality: Bilateral;  . KNEE ARTHROSCOPY  06/05/1998  . LAPAROSCOPIC GASTRIC BANDING    . TONSILLECTOMY    . TONSILLECTOMY      There were no vitals filed for this visit.  Subjective Assessment - 10/27/19 1008    Subjective  My neck is fine and my shoulder is good. I think I am good to go from here.    Currently in Pain?  No/denies         [facility] PT Assessment - 10/27/19 0001      AROM   Right Shoulder Internal Rotation  --   IR to T6 min pain    Right Shoulder External Rotation  --   FR to T4      Strength   Right  Shoulder Flexion  4+/5    Right Shoulder ABduction  4+/5        [facility] Adult PT Treatment/Exercise - 10/27/19 0001      Shoulder Exercises: Standing   Horizontal ABduction  Strengthening;Both;20 reps    Theraband Level (Shoulder Horizontal ABduction)  Level 3 (Green)    External Rotation  Strengthening;Both;20 reps;Theraband    Theraband Level (Shoulder External Rotation)  Level 3 (Green)    Other Standing Exercises  wall angels, goal post     Other Standing Exercises  standing forward raise, overhead raise and lateral raise with 4 lbs DB x 10 each , no pain but mild burining with flexion       Shoulder Exercises: ROM/Strengthening   Nustep  6 min UE and L5     Other ROM/Strengthening Exercises  quadruped alt. arms x 10 added legs for bird dog , 5sec hold x 10       Neck Exercises: Stretches   Levator Stretch  2 reps;30 seconds    Other Neck Stretches  upper trap x 2, 30 sec                   PT Long Term Goals - 10/27/19 1029  PT LONG TERM GOAL #1   Title  Pt will be able to show Independence with HEP for Rt UE strength, posture    Status  Achieved      PT LONG TERM GOAL #2   Title  Pt will be able to lift Rt UE through AROM and report no increase in pain (flexion and abduction)    Status  Achieved      PT LONG TERM GOAL #3   Title  Pt will demo Rt UE strength to 5/5 in flexion and abduction to maximize Rt UE function for work.    Baseline  4+/5, no pain    Status  Partially Met      PT LONG TERM GOAL #4   Title  Pt will be able to lie on Rt shoulder at night (2-3 hours) with only min pain    Baseline  can do this at the beach but not at her bed at home.    Status  Partially Met      PT LONG TERM GOAL #5   Title  Pt will be able to demo AROM in C spine without increased pain.    Baseline  no pain    Status  Achieved            Plan - 10/27/19 1034    Clinical Impression Statement  Pt has been out of town and not in PT for almost 1 mo.  She has  had very min to no pain over the course of this time.  She has partially or fully met her goals and would like to be discharged today.    PT Next Visit Plan  DC    PT Home Exercise Plan  PMLETLYM: Q-ped UE and bird dog, standing scap on wall, UT and LS stretch    Consulted and Agree with Plan of Care  Patient       Patient will benefit from skilled therapeutic intervention in order to improve the following deficits and impairments:  Decreased strength, Increased fascial restricitons, Decreased mobility, Postural dysfunction, Pain, Improper body mechanics, Impaired UE functional use  Visit Diagnosis: Chronic right shoulder pain  Muscle weakness (generalized)     Problem List Patient Active Problem List   Diagnosis Date Noted  . Abnormal liver ultrasound 07/29/2019  . Chronic RUQ pain 07/25/2019  . Chronic right shoulder pain 07/25/2019  . Rotator cuff tendonitis, right 06/18/2019  . Greater trochanteric bursitis of left hip 06/18/2019  . Chronic right-sided low back pain with right-sided sciatica 07/05/2018  . Forgetfulness 12/05/2016  . Headache 04/18/2016  . History of laparoscopic adjustable gastric banding, APS, 02/01/2010+ HH repair 07/10/2012    Kalila Adkison 10/27/2019, 10:47 AM  [place], [place], [address] Phone: [phone]   Fax:  [phone]  Name: Desani Sprung MRN: [id number] Date of Birth: [birth year]   PHYSICAL THERAPY DISCHARGE SUMMARY  Visits from Start of Care: 10  Current functional level related to goals / functional outcomes: See goals above    Remaining deficits: Min weakness in R shoulder flexion and abduction. Core weak as well.    Education / Equipment: Posture, lifting, HEP , DN  Plan: Patient agrees to discharge.  Patient goals were partially met. Patient is being discharged due to being pleased with the current functional level.  ?????     Raeford Razor,  PT 10/27/19 10:47 AM Phone: [phone] Fax: [phone]

## 2019-11-05 ENCOUNTER — Ambulatory Visit: Payer: 59 | Admitting: Physical Therapy

## 2019-11-11 ENCOUNTER — Encounter: Payer: 59 | Admitting: Physical Therapy

## 2020-01-21 ENCOUNTER — Other Ambulatory Visit: Payer: Self-pay

## 2020-02-18 ENCOUNTER — Ambulatory Visit
Admission: RE | Admit: 2020-02-18 | Discharge: 2020-02-18 | Disposition: A | Discharge status: Home / Self Care | Payer: 59 | Source: Ambulatory Visit | Attending: Medical | Admitting: Medical

## 2020-02-18 ENCOUNTER — Ambulatory Visit: Payer: 59 | Admitting: Medical

## 2020-02-18 ENCOUNTER — Other Ambulatory Visit: Payer: Self-pay

## 2020-02-18 ENCOUNTER — Encounter: Payer: Self-pay | Admitting: Medical

## 2020-02-18 VITALS — BP 120/72 | HR 59 | Ht 66.25 in | Wt 207.0 lb

## 2020-02-18 DIAGNOSIS — M7072 Other bursitis of hip, left hip: Secondary | ICD-10-CM | POA: Diagnosis not present

## 2020-02-18 DIAGNOSIS — M5432 Sciatica, left side: Secondary | ICD-10-CM | POA: Diagnosis not present

## 2020-02-18 DIAGNOSIS — M5442 Lumbago with sciatica, left side: Secondary | ICD-10-CM

## 2020-02-18 DIAGNOSIS — M25551 Pain in right hip: Secondary | ICD-10-CM | POA: Diagnosis not present

## 2020-02-18 DIAGNOSIS — G4700 Insomnia, unspecified: Secondary | ICD-10-CM

## 2020-02-18 DIAGNOSIS — M25552 Pain in left hip: Secondary | ICD-10-CM

## 2020-02-18 DIAGNOSIS — G8929 Other chronic pain: Secondary | ICD-10-CM

## 2020-02-18 MED ORDER — NAPROXEN 500 MG PO TABS: 500.0000 mg | ORAL_TABLET | Freq: Two times a day (BID) | ORAL | 0 refills | Status: DC

## 2020-02-18 NOTE — Patient Instructions (Signed)
Please go to [facility] for your lumbar spin and hip xrays.   Their hours are 8am - 4:30 pm Monday - Friday.  Take your insurance card with you.  [facility] [phone]  [address], [address] [place], [place] [address]  [address] [place], [place] [address]

## 2020-02-18 NOTE — Progress Notes (Signed)
Subjective:  Tiffany Ayers is a 44 y.o. female who presents for Chief Complaint  Patient presents with  . Hip Pain    bilateral-worse on left   . Back Pain    lower      Here for pains in hips and hurting so much, problem staying comfortable at night.  Back hurting as well.  Wakes up in pain, brain kicks on thinking about stuff, then can't go back to sleep.  Just moved recently, awakes thinking about organizing stuff, unpacking, etc from the recent move.   Constant chitter chatter in brain, making it hard to sleep.    Left hip has been hurting for months.   In past saw doctor who advised she had bursitis.    Also prior diagnosed with sciatica in left side.   No injury, no trauma, no fall.   Works as a Environmental manager.  Exercise typically includes walking.   No weight bearing exercise, no contact sports.   Pain is in left hip radiating from side to back and front of leg.   Has similar but less severe pain in right right.  Has low back pain.  At times gets pain down back of left leg when her "sciatica" acts up.   No numbness or tingling in legs.   No prior xray.  Prior treatment includes rest, therapeutic massage, had steroid shot in left hip, maybe several years ago.  Has used NSAIDs.   Has seen chiropractor 6 months ago.  No prior physical therapy.  No other aggravating or relieving factors.    No other c/o.  Past Medical History:  Diagnosis Date  . Abnormal liver ultrasound 07/29/2019  . Anti-phospholipid antibody syndrome (HCC)   . Medical history non-contributory   . 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 S/P cesarean section 08/21/2011   No current outpatient medications on file prior to visit.   No current facility-administered medications on file prior to visit.    The following portions of the patient's history were reviewed and updated as appropriate: allergies, current medications, past family history, past medical history, past social history, past surgical history and  problem list.  ROS Otherwise as in subjective above    Objective: BP 120/72   Pulse (!) 59   Ht 5' 6.25" (1.683 m)   Wt 207 lb (93.9 kg)   SpO2 98%   BMI 33.16 kg/m   General appearance: alert, no distress, well developed, well nourished LE pulses normal No LE edema Leg strength, dtr and sensation normal, negative SLR Back nontender, full ROM MSK: mild tendnerss over left hip bursa, otherwise legs nontender, no swelling, ROM of hips full.  There is in bowing of bilat knees.       Assessment: Encounter Diagnoses  Name Primary?  . Sciatica of left side Yes  . Chronic bilateral low back pain with left-sided sciatica   . Bilateral hip pain   . Bursitis of left hip, unspecified bursa   . Insomnia, unspecified type      Plan: Hip and back pain - discussed possible causes including sciatica, possible OA or DDD.    Will send for xrays.  Begin naprosyn, relative rest, c/t with stretching.  For now try to limit lots of squatting and stairs.   Likely refer to PT  insomnia - counseled on stress reduction, sleep hygiene.  Sleep issues triggered by wakening with pain from the hips.    Tiffany Ayers was seen today for hip pain and back pain.  Diagnoses and  all orders for this visit:  Sciatica of left side -     DG Lumbar Spine Complete; Future -     DG HIPS BILAT WITH PELVIS 3-4 VIEWS; Future  Chronic bilateral low back pain with left-sided sciatica -     DG Lumbar Spine Complete; Future -     DG HIPS BILAT WITH PELVIS 3-4 VIEWS; Future  Bilateral hip pain -     DG Lumbar Spine Complete; Future -     DG HIPS BILAT WITH PELVIS 3-4 VIEWS; Future  Bursitis of left hip, unspecified bursa -     DG Lumbar Spine Complete; Future -     DG HIPS BILAT WITH PELVIS 3-4 VIEWS; Future  Insomnia, unspecified type  Other orders -     naproxen (NAPROSYN) 500 MG tablet; Take 1 tablet (500 mg total) by mouth 2 (two) times daily with a meal.    Follow up: pending xray

## 2020-02-19 ENCOUNTER — Other Ambulatory Visit: Payer: Self-pay

## 2020-02-19 DIAGNOSIS — G8929 Other chronic pain: Secondary | ICD-10-CM

## 2020-04-12 ENCOUNTER — Ambulatory Visit
Admission: RE | Admit: 2020-04-12 | Discharge: 2020-04-12 | Disposition: A | Discharge status: Home / Self Care | Payer: 59 | Source: Ambulatory Visit | Attending: Family Medicine | Admitting: Family Medicine

## 2020-04-12 ENCOUNTER — Other Ambulatory Visit: Payer: Self-pay

## 2020-04-12 DIAGNOSIS — N6310 Unspecified lump in the right breast, unspecified quadrant: Secondary | ICD-10-CM

## 2020-05-13 ENCOUNTER — Other Ambulatory Visit: Payer: Self-pay | Admitting: Family Medicine

## 2020-05-13 DIAGNOSIS — R928 Other abnormal and inconclusive findings on diagnostic imaging of breast: Secondary | ICD-10-CM

## 2020-08-10 ENCOUNTER — Ambulatory Visit (INDEPENDENT_AMBULATORY_CARE_PROVIDER_SITE_OTHER): Payer: 59 | Admitting: Obstetrics and Gynecology

## 2020-08-10 ENCOUNTER — Other Ambulatory Visit: Payer: Self-pay

## 2020-08-10 ENCOUNTER — Other Ambulatory Visit (HOSPITAL_COMMUNITY)
Admission: RE | Admit: 2020-08-10 | Discharge: 2020-08-10 | Disposition: A | Discharge status: Home / Self Care | Payer: 59 | Source: Ambulatory Visit | Attending: Obstetrics and Gynecology | Admitting: Obstetrics and Gynecology

## 2020-08-10 ENCOUNTER — Encounter: Payer: Self-pay | Admitting: Obstetrics and Gynecology

## 2020-08-10 VITALS — BP 138/85 | HR 62 | Ht 64.5 in | Wt 202.8 lb

## 2020-08-10 DIAGNOSIS — Z3202 Encounter for pregnancy test, result negative: Secondary | ICD-10-CM | POA: Diagnosis not present

## 2020-08-10 DIAGNOSIS — Z01419 Encounter for gynecological examination (general) (routine) without abnormal findings: Secondary | ICD-10-CM | POA: Insufficient documentation

## 2020-08-10 DIAGNOSIS — Z01411 Encounter for gynecological examination (general) (routine) with abnormal findings: Secondary | ICD-10-CM

## 2020-08-10 DIAGNOSIS — R109 Unspecified abdominal pain: Secondary | ICD-10-CM

## 2020-08-10 DIAGNOSIS — R030 Elevated blood-pressure reading, without diagnosis of hypertension: Secondary | ICD-10-CM | POA: Diagnosis not present

## 2020-08-10 LAB — POCT URINALYSIS DIPSTICK
Glucose, UA: NEGATIVE
Leukocytes, UA: NEGATIVE
Nitrite, UA: NEGATIVE
Protein, UA: NEGATIVE

## 2020-08-10 LAB — POCT URINE PREGNANCY: Preg Test, Ur: NEGATIVE

## 2020-08-10 NOTE — Progress Notes (Signed)
Obstetrics and Gynecology Annual Patient Evaluation  Appointment Date: 08/10/2020  OBGYN Clinic: [facility]  Primary Care Provider: [facility]  Referring Provider: Avanell Shackleton, NP-C  Chief Complaint:  Chief Complaint  Patient presents with  . Gynecologic Exam    History of Present Illness: Tiffany Ayers is a 45 y.o.  [phone] (Patient's last menstrual period was 07/25/2020.), seen for the above chief complaint. Her past medical history is significant for h/o c-section and BTL.  For the past six months, patient with more pain when she ovulates and prior to her period that lasts for a few days after her period starts. Also during this time, her periods are not as regular as they used to be and can be shorter or longer in their interval in between periods; they are still are approximately 5 days and not particularly heavy.  She denies any menopausal s/s.    Review of Systems: Pertinent items noted in HPI and remainder of comprehensive ROS otherwise negative.    Patient Active Problem List   Diagnosis Date Noted  . Transient hypertension 08/10/2020  . Sciatica of left side 02/18/2020  . Chronic bilateral low back pain with left-sided sciatica 02/18/2020  . Bilateral hip pain 02/18/2020  . Bursitis of left hip 02/18/2020  . Insomnia 02/18/2020  . Abnormal liver ultrasound 07/29/2019  . Chronic RUQ pain 07/25/2019  . Chronic right shoulder pain 07/25/2019  . Rotator cuff tendonitis, right 06/18/2019  . Greater trochanteric bursitis of left hip 06/18/2019  . Chronic right-sided low back pain with right-sided sciatica 07/05/2018  . Forgetfulness 12/05/2016  . Headache 04/18/2016  . History of laparoscopic adjustable gastric banding, APS, 02/01/2010+ HH repair 07/10/2012    Past Medical History:  Past Medical History:  Diagnosis Date  . Abnormal liver ultrasound 07/29/2019  . Anti-phospholipid antibody syndrome (HCC)   . Recurrent  pregnancy loss 08/21/2011  . Vaginal Pap smear, abnormal     Past Surgical History:  Past Surgical History:  Procedure Laterality Date  . CESAREAN SECTION  2004  . CESAREAN SECTION  08/21/2011   Procedure: CESAREAN SECTION;  Surgeon: Sherron Monday, MD;  Location: [facility];  Service: Gynecology;  Laterality: N/A;  . CESAREAN SECTION WITH BILATERAL TUBAL LIGATION Bilateral 02/01/2013   Procedure: REPEAT CESAREAN SECTION WITH BILATERAL TUBAL LIGATION;  Surgeon: Catalina Antigua, MD;  Location: [facility];  Service: Obstetrics;  Laterality: Bilateral;  . KNEE ARTHROSCOPY  06/05/1998  . LAPAROSCOPIC GASTRIC BANDING    . TONSILLECTOMY    . TONSILLECTOMY      Past Obstetrical History:  OB History  Gravida Para Term Preterm AB Living  7 4 4   3 4   SAB IAB Ectopic Multiple Live Births  3       4    # Outcome Date GA Lbr Len/2nd Weight Sex Delivery Anes PTL Lv  7 Term 02/01/13 [redacted]w[redacted]d  7 lb 1.8 oz (3.225 kg) F CS-Vac Spinal  LIV  6 Term 08/21/11 [redacted]w[redacted]d  7 lb 11.6 oz (3.505 kg) M CS-Vac Spinal  LIV     Birth Comments: ERLTCS  5 SAB 10/2010 [redacted]w[redacted]d         4 SAB 2010 [redacted]w[redacted]d         3 SAB 2008 [redacted]w[redacted]d         2 Term 10/23/02 [redacted]w[redacted]d  6 lb 12 oz (3.062 kg) M CS-LTranv EPI  LIV     Birth Comments: Emergent  due NRFHR  1 Term  Hannah Beat    Past Gynecological History: As per HPI. History of Pap Smear(s): Yes.   Last pap 2017, which was negative  Social History:  Social History   Socioeconomic History  . Marital status: Married    Spouse name: Not on file  . Number of children: Not on file  . Years of education: Not on file  . Highest education level: Not on file  Occupational History  . Not on file  Tobacco Use  . Smoking status: Never Smoker  . Smokeless tobacco: Never Used  Vaping Use  . Vaping Use: Never used  Substance and Sexual Activity  . Alcohol use: No    Comment: occasional  . Drug use: No  . Sexual activity: Yes    Partners: Male    Birth control/protection: Surgical  Other Topics  Concern  . Not on file  Social History Narrative  . Not on file   Social Determinants of Health   Financial Resource Strain: Not on file  Food Insecurity: Not on file  Transportation Needs: Not on file  Physical Activity: Not on file  Stress: Not on file  Social Connections: Not on file  Intimate Partner Violence: Not on file    Family History:  Family History  Problem Relation Age of Onset  . Healthy Mother   . Endometriosis Mother   . Healthy Father   . Diabetes Maternal Grandmother   . Heart disease Maternal Grandmother   . Endometriosis Maternal Grandmother   . Cleft lip Sister   . Heart disease Maternal Grandfather    She denies any female cancers  Medications Laryssa E. [employer]" had no medications administered during this visit. Current Outpatient Medications  Medication Sig Dispense Refill  . Multiple Vitamin (MULTIVITAMIN) tablet Take 1 tablet by mouth daily.    . naproxen (NAPROSYN) 500 MG tablet Take 1 tablet (500 mg total) by mouth 2 (two) times daily with a meal. (Patient not taking: Reported on 08/10/2020) 30 tablet 0   No current facility-administered medications for this visit.    Allergies Tramadol and Vicodin [hydrocodone-acetaminophen]   Physical Exam:  BP 138/85   Pulse 62   Ht 5' 4.5" (1.638 m)   Wt 202 lb 12.8 oz (92 kg)   LMP 07/25/2020   BMI 34.27 kg/m  Body mass index is 34.27 kg/m.  General appearance: Well nourished, well developed female in no acute distress.  Cardiovascular: normal s1 and s2.  No murmurs, rubs or gallops. Respiratory:  Clear to auscultation bilateral. Normal respiratory effort Abdomen: positive bowel sounds and no masses, hernias; diffusely non tender to palpation, non distended Breasts: breasts appear normal, no suspicious masses, no skin or nipple changes or axillary nodes, and normal palpation. Neuro/Psych:  Normal mood and affect.  Skin:  Warm and dry.  Lymphatic:  No inguinal lymphadenopathy.   Pelvic  exam: is not limited by body habitus EGBUS: within normal limits Vagina: within normal limits and with no blood or discharge in the vault Cervix: normal appearing cervix without tenderness, discharge or lesions. Uterus:  nonenlarged and non tender Adnexa:  normal adnexa and no mass, fullness, tenderness Rectovaginal: deferred  Laboratory: none  Radiology: none  Assessment: pt stable  Plan:  1. Transient hypertension Normal BP at her PCP visit yesterday and in the past. Continue to follow  2. Abdominal pain, unspecified abdominal location I d/w her that her s/s, including her change in cycles is likely due to age and body trying to  compensate and make a regular monthly cycle. She had regular labs drawn earlier for her PCP and it looks like she had a tsh and cbc; will follow up on these but if normal I told her that as long as her period is between 21-42 days that this is technically normal but if becomes outside of this or if she'd like more regularity with her periods to let us know; I also d/w her re: an u/s and I recommend one; pt to let us know if she'd like to schedule an early cycle one - POCT urine pregnancy - POCT Urinalysis Dipstick  3. Encounter for well woman exam with routine gynecological exam - Cytology - PAP  Orders Placed This Encounter  Procedures  . POCT urine pregnancy  . POCT Urinalysis Dipstick    RTC PRN   Cornelia Copa MD Attending [facility])

## 2020-08-10 NOTE — Progress Notes (Signed)
Pt c/o of lower abdomen pain

## 2020-08-12 LAB — CYTOLOGY - PAP
Adequacy: ABSENT
Chlamydia: NEGATIVE
Comment: NEGATIVE
Comment: NEGATIVE
Comment: NEGATIVE
Comment: NORMAL
Diagnosis: NEGATIVE
High risk HPV: NEGATIVE
Neisseria Gonorrhea: NEGATIVE
Trichomonas: NEGATIVE

## 2020-09-01 ENCOUNTER — Emergency Department
Admission: EM | Admit: 2020-09-01 | Discharge: 2020-09-01 | Disposition: A | Discharge status: Home / Self Care | Payer: 59 | Attending: Emergency Medicine | Admitting: Emergency Medicine

## 2020-09-01 ENCOUNTER — Emergency Department: Payer: 59

## 2020-09-01 ENCOUNTER — Other Ambulatory Visit: Payer: Self-pay

## 2020-09-01 DIAGNOSIS — R1011 Right upper quadrant pain: Secondary | ICD-10-CM | POA: Diagnosis present

## 2020-09-01 DIAGNOSIS — K209 Esophagitis, unspecified without bleeding: Secondary | ICD-10-CM | POA: Diagnosis not present

## 2020-09-01 DIAGNOSIS — R1013 Epigastric pain: Secondary | ICD-10-CM

## 2020-09-01 DIAGNOSIS — Z9884 Bariatric surgery status: Secondary | ICD-10-CM

## 2020-09-01 LAB — URINALYSIS, COMPLETE (UACMP) WITH MICROSCOPIC
Bilirubin Urine: NEGATIVE
Glucose, UA: NEGATIVE mg/dL
Hgb urine dipstick: NEGATIVE
Ketones, ur: NEGATIVE mg/dL
Leukocytes,Ua: NEGATIVE
Nitrite: NEGATIVE
Protein, ur: NEGATIVE mg/dL
Specific Gravity, Urine: 1.005 (ref 1.005–1.030)
pH: 6 (ref 5.0–8.0)

## 2020-09-01 LAB — CBC
HCT: 40.7 % (ref 36.0–46.0)
Hemoglobin: 13.3 g/dL (ref 12.0–15.0)
MCH: 29.2 pg (ref 26.0–34.0)
MCHC: 32.7 g/dL (ref 30.0–36.0)
MCV: 89.3 fL (ref 80.0–100.0)
Platelets: 240 10*3/uL (ref 150–400)
RBC: 4.56 MIL/uL (ref 3.87–5.11)
RDW: 12.9 % (ref 11.5–15.5)
WBC: 7.4 10*3/uL (ref 4.0–10.5)
nRBC: 0 % (ref 0.0–0.2)

## 2020-09-01 LAB — COMPREHENSIVE METABOLIC PANEL
ALT: 18 U/L (ref 0–44)
AST: 20 U/L (ref 15–41)
Albumin: 4 g/dL (ref 3.5–5.0)
Alkaline Phosphatase: 60 U/L (ref 38–126)
Anion gap: 9 (ref 5–15)
BUN: 12 mg/dL (ref 6–20)
CO2: 23 mmol/L (ref 22–32)
Calcium: 8.9 mg/dL (ref 8.9–10.3)
Chloride: 106 mmol/L (ref 98–111)
Creatinine, Ser: 0.59 mg/dL (ref 0.44–1.00)
GFR, Estimated: >60 mL/min (ref >60)
Glucose, Bld: 89 mg/dL (ref 70–99)
Potassium: 3.9 mmol/L (ref 3.5–5.1)
Sodium: 138 mmol/L (ref 135–145)
Total Bilirubin: 0.7 mg/dL (ref 0.3–1.2)
Total Protein: 6.8 g/dL (ref 6.5–8.1)

## 2020-09-01 LAB — POC URINE PREG, ED: Preg Test, Ur: NEGATIVE

## 2020-09-01 LAB — LIPASE, BLOOD: Lipase: 38 U/L (ref 11–51)

## 2020-09-01 MED ORDER — SUCRALFATE 1 GM/10ML PO SUSP: 1.0000 g | Freq: Three times a day (TID) | ORAL | 1 refills | Status: DC

## 2020-09-01 MED ORDER — IOHEXOL 300 MG/ML  SOLN
100.0000 mL | Freq: Once | INTRAMUSCULAR | Status: AC | PRN
  Administered 2020-09-01: 100 mL via INTRAVENOUS

## 2020-09-01 MED ORDER — DICYCLOMINE HCL 10 MG PO CAPS: 10.0000 mg | ORAL_CAPSULE | Freq: Three times a day (TID) | ORAL | 0 refills | Status: DC

## 2020-09-01 NOTE — ED Provider Notes (Signed)
[facility] Emergency Department Provider Note   ____________________________________________   Event Date/Time   First MD Initiated Contact with Patient 09/01/20 1615     (approximate)  I have reviewed the triage vital signs and the nursing notes.   HISTORY  Chief Complaint Abdominal Pain    HPI Tiffany Ayers is a 45 y.o. female with a stated past medical history of of antiphospholipid antibody syndrome and lap band procedure done 11 years ago with recurrent complications who presents for periodic inability to eat and epigastric/right upper quadrant abdominal pain.  Patient states that she has been told she has gallbladder problems in the past but has not had surgery for it yet.  Patient states that this pain seems similar to her normal gallbladder pain however it has not subsided.  Patient states that food worsens this pain.  Patient describes a burning, midepigastric/right upper quadrant abdominal pain that is 8/10 in severity and does not radiate.  Patient currently denies any vision changes, tinnitus, difficulty speaking, facial droop, sore throat, chest pain, shortness of breath, nausea/vomiting/diarrhea, dysuria, or weakness/numbness/paresthesias in any extremity         Past Medical History:  Diagnosis Date  . Abnormal liver ultrasound 07/29/2019  . Anti-phospholipid antibody syndrome (HCC)   . 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 Vaginal Pap smear, abnormal     Patient Active Problem List   Diagnosis Date Noted  . Transient hypertension 08/10/2020  . Sciatica of left side 02/18/2020  . Chronic bilateral low back pain with left-sided sciatica 02/18/2020  . Bilateral hip pain 02/18/2020  . Bursitis of left hip 02/18/2020  . Insomnia 02/18/2020  . Abnormal liver ultrasound 07/29/2019  . Chronic RUQ pain 07/25/2019  . Chronic right shoulder pain 07/25/2019  . Rotator cuff tendonitis, right 06/18/2019  . Greater trochanteric bursitis  of left hip 06/18/2019  . Chronic right-sided low back pain with right-sided sciatica 07/05/2018  . Forgetfulness 12/05/2016  . Headache 04/18/2016  . 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 CESAREAN SECTION  2004  . CESAREAN SECTION  08/21/2011   Procedure: CESAREAN SECTION;  Surgeon: Sherron MondayJody Bovard, MD;  Location: [facility];  Service: Gynecology;  Laterality: N/A;  . CESAREAN SECTION WITH BILATERAL TUBAL LIGATION Bilateral 02/01/2013   Procedure: REPEAT CESAREAN SECTION WITH BILATERAL TUBAL LIGATION;  Surgeon: Catalina AntiguaPeggy Constant, MD;  Location: [facility];  Service: Obstetrics;  Laterality: Bilateral;  . KNEE ARTHROSCOPY  06/05/1998  . LAPAROSCOPIC GASTRIC BANDING    . TONSILLECTOMY    . TONSILLECTOMY      Prior to Admission medications   Medication Sig Start Date End Date Taking? Authorizing Provider  dicyclomine (BENTYL) 10 MG capsule Take 1 capsule (10 mg total) by mouth 4 (four) times daily -  before meals and at bedtime for 14 days. 09/01/20 09/15/20 Yes Alizah Sills, Clent JacksEvan K, MD  sucralfate (CARAFATE) 1 GM/10ML suspension Take 10 mLs (1 g total) by mouth 4 (four) times daily -  with meals and at bedtime. 09/01/20 09/01/21 Yes Merwyn KatosBradler, [place] Ar[place] K, MD  Multiple Vitamin (MULTIVITAMIN) tablet Take 1 tablet by mouth daily.    [provider]  naproxen (NAPROSYN) 500 MG tablet Take 1 tablet (500 mg total) by mouth 2 (two) times daily with a meal. Patient not taking: Reported on 08/10/2020 02/18/20   Tysinger, Kermit Baloavid S, PA-C    Allergies Tramadol and Vicodin [hydrocodone-acetaminophen]  Family History  Problem Relation Age of Onset  . Healthy Mother   . Endometriosis Mother   . Healthy Father   . Diabetes Maternal Grandmother   . Heart disease Maternal Grandmother   . Endometriosis Maternal Grandmother   . Cleft lip Sister   . Heart disease Maternal Grandfather     Social History Social  History   Tobacco Use  . Smoking status: Never Smoker  . Smokeless tobacco: Never Used  Vaping Use  . Vaping Use: Never used  Substance Use Topics  . Alcohol use: No    Comment: occasional  . Drug use: No    Review of Systems Constitutional: No fever/chills Eyes: No visual changes. ENT: No sore throat. Cardiovascular: Denies chest pain. Respiratory: Denies shortness of breath. Gastrointestinal: Endorses abdominal pain.  Endorses intermittent nausea, no vomiting.  No diarrhea. Genitourinary: Negative for dysuria. Musculoskeletal: Negative for acute arthralgias Skin: Negative for rash. Neurological: Negative for headaches, weakness/numbness/paresthesias in any extremity Psychiatric: Negative for suicidal ideation/homicidal ideation   ____________________________________________   PHYSICAL EXAM:  VITAL SIGNS: ED Triage Vitals  Enc Vitals Group     BP 09/01/20 1550 (!) 157/85     Pulse Rate 09/01/20 1550 66     Resp 09/01/20 1550 16     Temp 09/01/20 1550 98 F (36.7 C)     Temp Source 09/01/20 1550 Oral     SpO2 09/01/20 1550 97 %     Weight 09/01/20 1551 200 lb (90.7 kg)     Height 09/01/20 1551 5\' 5"  (1.651 m)     Head Circumference --      Peak Flow --      Pain Score 09/01/20 1550 8     Pain Loc --      Pain Edu? --      Excl. in GC? --    Constitutional: Alert and oriented. Well appearing and in no acute distress. Eyes: Conjunctivae are normal. PERRL. Head: Atraumatic. Nose: No congestion/rhinnorhea. Mouth/Throat: Mucous membranes are moist. Neck: No stridor Cardiovascular: Grossly normal heart sounds.  Good peripheral circulation. Respiratory: Normal respiratory effort.  No retractions. Gastrointestinal: Soft and midepigastric and right upper quadrant tenderness to palpation. No distention. Musculoskeletal: No obvious deformities Neurologic:  Normal speech and language. No gross focal neurologic deficits are appreciated. Skin:  Skin is warm and dry. No  rash noted. Psychiatric: Mood and affect are normal. Speech and behavior are normal.  ____________________________________________   LABS (all labs ordered are listed, but only abnormal results are displayed)  Labs Reviewed  URINALYSIS, COMPLETE (UACMP) WITH MICROSCOPIC - Abnormal; Notable for the following components:      Result Value   Color, Urine STRAW (*)    APPearance HAZY (*)    Bacteria, UA RARE (*)    All other components within normal limits  LIPASE, BLOOD  COMPREHENSIVE METABOLIC PANEL  CBC  POC URINE PREG, ED   ____________________________________________  EKG  ED ECG REPORT I, 09/03/20, the attending physician, personally viewed and interpreted this ECG.  Date: 09/01/2020 EKG Time: 1557 Rate: 58 Rhythm: normal sinus rhythm QRS Axis: normal Intervals: normal ST/T Wave abnormalities: normal Narrative Interpretation: no evidence of acute ischemia  ____________________________________________  RADIOLOGY  ED MD interpretation: CT of the abdomen and pelvis with contrast shows significant findings of distal esophageal mucosal thickening concerning for esophagitis.  No other acute findings  Official radiology report(s): CT Abdomen Pelvis W Contrast  Result Date: 09/01/2020 CLINICAL DATA:  Epigastric pain, laparotomy band EXAM: CT ABDOMEN AND  PELVIS WITH CONTRAST TECHNIQUE: Multidetector CT imaging of the abdomen and pelvis was performed using the standard protocol following bolus administration of intravenous contrast. CONTRAST:  OMNIPAQUE IOHEXOL 300 MG/ML  SOLN COMPARISON:  HIDA 08/14/2019, ultrasound 07/29/2019, upper GI 2 9 FINDINGS: Lower chest: Lung bases are clear. Normal heart size. No pericardial effusion. Hepatobiliary: No worrisome focal liver lesions. Smooth liver surface contour. Normal hepatic attenuation. Normal gallbladder and biliary tree. Pancreas: No pancreatic ductal dilatation or surrounding inflammatory changes. Spleen: Normal in  size. No concerning splenic lesions. Adrenals/Urinary Tract: Normal adrenal glands. Kidneys are normally located with symmetric enhancement and excretion. No suspicious renal lesion, urolithiasis or hydronephrosis. Urinary bladder is unremarkable. Stomach/Bowel: Circumferential thickening of the distal thoracic esophagus. Laparotomy band is in place. Approximate phi angle measurement of 35 degrees is within expected normal. Portion of the adjacent gastric wall appears to wrap over the superior margin but without evidence of erosion. Distal stomach is unremarkable. Duodenum is free of acute abnormality with normal sweep across the midline abdomen. No small bowel thickening or dilatation. Slight fecalization of distal small bowel contents without mechanical obstruction. A normal appendix is visualized. No colonic dilatation or wall thickening. Vascular/Lymphatic: No significant vascular findings are present. No enlarged abdominal or pelvic lymph nodes. Reproductive: Anteverted uterus. Hypoattenuating focus towards the lower uterine segment measuring up to 1.9 cm in size, possible intramural fibroid. Additional fluid attenuation structure towards the cervix, possible nabothian cyst. No concerning adnexal lesions. Other: No abdominopelvic free fluid or free gas. No bowel containing hernias. Laparotomy band reservoir terminating to the right of midline at the mid abdomen. Intact catheter tubing. Musculoskeletal: Multilevel degenerative changes are present in the imaged portions of the spine. Features most pronounced L5-S1. Minimal degenerative changes in the hips and pelvis. No acute osseous abnormality or suspicious osseous lesion. IMPRESSION: 1. Laparotomy band is in place. Portion of the adjacent gastric wall appears to wrap over the superior margin but without evidence of erosion. Phi angle is within expected normal. No abnormality of the subcutaneous reservoir, discontinuity of the catheter or other complication is  seen. 2. Circumferential thickening of the distal thoracic esophagus, could reflect esophagitis. Could consider further evaluation with direct visualization as clinically warranted. 3. Slight fecalization of distal small bowel contents without mechanical obstruction. Findings are nonspecific, but can be seen with slowed intestinal transit/constipation. 4. Hypoattenuating focus towards the lower uterine segment measuring up to 1.9 cm in size, possible intramural fibroid. Additional fluid attenuation structure towards the cervix, possible nabothian cyst. Could be further evaluated with pelvic ultrasound on a nonemergent basis as clinically warranted. Electronically Signed   By: Kreg Shropshire M.D.   On: 09/01/2020 18:29    ____________________________________________   PROCEDURES  Procedure(s) performed (including Critical Care):  .1-3 Lead EKG Interpretation Performed by: Merwyn Katos, MD Authorized by: Merwyn Katos, MD     Interpretation: normal     ECG rate:  61   ECG rate assessment: normal     Rhythm: sinus rhythm     Ectopy: none     Conduction: normal       ____________________________________________   INITIAL IMPRESSION / ASSESSMENT AND PLAN / ED COURSE  As part of my medical decision making, I reviewed the following data within the electronic MEDICAL RECORD NUMBER Nursing notes reviewed and incorporated, Labs reviewed, EKG interpreted, Old chart reviewed, Radiograph reviewed and Notes from prior ED visits reviewed and incorporated        Patients symptoms not typical for emergent  causes of abdominal pain such as, but not limited to, appendicitis, abdominal aortic aneurysm, surgical biliary disease, pancreatitis, SBO, mesenteric ischemia, serious intra-abdominal bacterial illness. Presentation also not typical of gynecologic emergencies such as TOA, Ovarian Torsion, PID. Not Ectopic. Doubt atypical ACS. Spoke to the on-call bariatric physician, Dr. Dorna Bloom, at [facility] who stated  that these changes seen on patient's CT scan are likely normal status post lap band Pt tolerating PO. Disposition: Patient will be discharged with strict return precautions and follow up with primary MD within 12-24 hours for further evaluation. Patient understands that this still may have an early presentation of an emergent medical condition such as appendicitis that will require a recheck.      ____________________________________________   FINAL CLINICAL IMPRESSION(S) / ED DIAGNOSES  Final diagnoses:  Right upper quadrant abdominal pain  Epigastric pain  Esophagitis  History of laparoscopic adjustable gastric banding     ED Discharge Orders         Ordered    sucralfate (CARAFATE) 1 GM/10ML suspension  3 times daily with meals & bedtime        09/01/20 1912    dicyclomine (BENTYL) 10 MG capsule  3 times daily before meals & bedtime        09/01/20 1912           Note:  This document was prepared using Dragon voice recognition software and may include unintentional dictation errors.   Merwyn Katos, MD 09/01/20 [phone]

## 2020-09-01 NOTE — ED Triage Notes (Signed)
Pt arrives via pov, states that she has been having issues with her lap band that she's had for 11 years, pt reports periodically she has issues and has been unable to eat with it, last week had the fluid removed from it and has been having pain since. Pt c/o epigastric and mid abd pain and states that she is also now having gallbladder pain

## 2020-09-09 ENCOUNTER — Other Ambulatory Visit: Payer: Self-pay

## 2020-09-09 ENCOUNTER — Ambulatory Visit (INDEPENDENT_AMBULATORY_CARE_PROVIDER_SITE_OTHER): Payer: 59 | Admitting: Surgery

## 2020-09-09 VITALS — BP 119/85 | HR 60 | Temp 98.4°F | Ht 65.0 in | Wt 201.0 lb

## 2020-09-09 DIAGNOSIS — K828 Other specified diseases of gallbladder: Secondary | ICD-10-CM | POA: Insufficient documentation

## 2020-09-09 DIAGNOSIS — Z9884 Bariatric surgery status: Secondary | ICD-10-CM | POA: Diagnosis not present

## 2020-09-09 NOTE — Patient Instructions (Addendum)
Please give us a call if you have any questions or concerns. 

## 2020-09-09 NOTE — Progress Notes (Signed)
Patient ID: Tiffany Ayers, female   DOB: 01/19/1976, 45 y.o.   MRN: [id number]  Chief Complaint:  Biliary dyskinesia and lap band  History of Present Illness Tiffany Ayers is a 45 y.o. female with f/u after ED visit.  CT work up negative for complicating features of her lap band causing her epigastric pain.  Had Dr. Daphine Deutscher visit for adjustment and is currently scheduled to see him for scheduling combined cholecystectomy with lap band removal.  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   Past Medical History Past Medical History:  Diagnosis Date  . Abnormal liver ultrasound 07/29/2019  . Anti-phospholipid antibody syndrome (HCC)   . 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 Vaginal Pap smear, abnormal       Past Surgical History:  Procedure Laterality Date  . CESAREAN SECTION  2004  . CESAREAN SECTION  08/21/2011   Procedure: CESAREAN SECTION;  Surgeon: Sherron Monday, MD;  Location: [facility];  Service: Gynecology;  Laterality: N/A;  . CESAREAN SECTION WITH BILATERAL TUBAL LIGATION Bilateral 02/01/2013   Procedure: REPEAT CESAREAN SECTION WITH BILATERAL TUBAL LIGATION;  Surgeon: Catalina Antigua, MD;  Location: [facility];  Service: Obstetrics;  Laterality: Bilateral;  . KNEE ARTHROSCOPY  06/05/1998  . LAPAROSCOPIC GASTRIC BANDING    . TONSILLECTOMY    . TONSILLECTOMY      Allergies  Allergen Reactions  . Tramadol Other (See Comments)    Cannot sleep  . Vicodin [Hydrocodone-Acetaminophen] Itching and Nausea Only    Current Outpatient Medications  Medication Sig Dispense Refill  . dicyclomine (BENTYL) 10 MG capsule Take 1 capsule (10 mg total) by mouth 4 (four) times daily -  before meals and at bedtime for 14 days. 56 capsule 0  . Multiple Vitamin (MULTIVITAMIN) tablet Take 1 tablet by mouth daily.    . naproxen (NAPROSYN) 500 MG tablet Take 1 tablet (500 mg total) by mouth 2 (two) times daily with a meal. (Patient not taking: Reported on  08/10/2020) 30 tablet 0  . sucralfate (CARAFATE) 1 GM/10ML suspension Take 10 mLs (1 g total) by mouth 4 (four) times daily -  with meals and at bedtime. 420 mL 1   No current facility-administered medications for this visit.    Family History Family History  Problem Relation Age of Onset  . Healthy Mother   . Endometriosis Mother   . Healthy Father   . Diabetes Maternal Grandmother   . Heart disease Maternal Grandmother   . Endometriosis Maternal Grandmother   . Cleft lip Sister   . Heart disease Maternal Grandfather       Social History Social History   Tobacco Use  . Smoking status: Never Smoker  . Smokeless tobacco: Never Used  Vaping Use  . Vaping Use: Never used  Substance Use Topics  . Alcohol use: No    Comment: occasional  . Drug use: No        Review of Systems  Constitutional: Negative for chills and fever.  HENT: Negative.   Eyes: Negative.   Respiratory: Negative.   Cardiovascular: Negative.   Gastrointestinal: Positive for abdominal pain and nausea. Negative for constipation and diarrhea.  Genitourinary: Negative.   Skin: Negative.   Neurological: Negative.   Psychiatric/Behavioral: Negative.       Physical Exam There were no vitals taken for this visit.   CONSTITUTIONAL: Well developed, and nourished, appropriately responsive and aware without distress.   EYES: Sclera non-icteric.   EARS, NOSE, MOUTH AND  THROAT: Mask worn.   Hearing is intact to voice. MUSCULOSKELETAL:  Symmetrical muscle tone appreciated in all four extremities.    SKIN: Skin turgor is normal. No pathologic skin lesions appreciated.  NEUROLOGIC:  Motor and sensation appear grossly normal.  Cranial nerves are grossly without defect. PSYCH:  Alert and oriented to person, place and time. Affect is appropriate for situation.  Data Reviewed I have personally reviewed what is currently available of the patient's imaging, recent labs and medical records.   Labs:  CBC Latest Ref  Rng & Units 09/01/2020 07/18/2019 12/05/2016  WBC 4.0 - 10.5 K/uL 7.4 6.7 6.2  Hemoglobin 12.0 - 15.0 g/dL 34.1 93.7 90.2  Hematocrit 36.0 - 46.0 % 40.7 40.3 38.5  Platelets 150 - 400 K/uL 240 276 248   CMP Latest Ref Rng & Units 09/01/2020 07/18/2019 12/05/2016  Glucose 70 - 99 mg/dL 89 94 96  BUN 6 - 20 mg/dL 12 9 12   Creatinine 0.44 - 1.00 mg/dL 4.09 7.35  Sodium 135 - 145 mmol/L 138 139 142  Potassium 3.5 - 5.1 mmol/L 3.9 4.1 4.1  Chloride 98 - 111 mmol/L 106 107 105  CO2 22 - 32 mmol/L 23 22 24   Calcium 8.9 - 10.3 mg/dL 8.9 3.29) 8.8  Total Protein 6.5 - 8.1 g/dL 6.8 6.3(L) 6.1  Total Bilirubin 0.3 - 1.2 mg/dL 0.7 0.6 0.4  Alkaline Phos 38 - 126 U/L 60 59 54  AST 15 - 41 U/L 20 15 16   ALT 0 - 44 U/L 18 16 15       Imaging: Radiology review:  Images reviewed.    HIDA with EF of 29% a year ago.   CLINICAL DATA:  Epigastric pain, laparotomy band  EXAM: CT ABDOMEN AND PELVIS WITH CONTRAST  TECHNIQUE: Multidetector CT imaging of the abdomen and pelvis was performed using the standard protocol following bolus administration of intravenous contrast.  CONTRAST:  OMNIPAQUE IOHEXOL 300 MG/ML  SOLN  COMPARISON:  HIDA 08/14/2019, ultrasound 07/29/2019, upper GI 2 9  FINDINGS: Lower chest: Lung bases are clear. Normal heart size. No pericardial effusion.  Hepatobiliary: No worrisome focal liver lesions. Smooth liver surface contour. Normal hepatic attenuation. Normal gallbladder and biliary tree.  Pancreas: No pancreatic ductal dilatation or surrounding inflammatory changes.  Spleen: Normal in size. No concerning splenic lesions.  Adrenals/Urinary Tract: Normal adrenal glands. Kidneys are normally located with symmetric enhancement and excretion. No suspicious renal lesion, urolithiasis or hydronephrosis. Urinary bladder is unremarkable.  Stomach/Bowel: Circumferential thickening of the distal thoracic esophagus. Laparotomy band is in place.  Approximate phi angle measurement of 35 degrees is within expected normal. Portion of the adjacent gastric wall appears to wrap over the superior margin but without evidence of erosion. Distal stomach is unremarkable. Duodenum is free of acute abnormality with normal sweep across the midline abdomen. No small bowel thickening or dilatation. Slight fecalization of distal small bowel contents without mechanical obstruction. A normal appendix is visualized. No colonic dilatation or wall thickening.  Vascular/Lymphatic: No significant vascular findings are present. No enlarged abdominal or pelvic lymph nodes.  Reproductive: Anteverted uterus. Hypoattenuating focus towards the lower uterine segment measuring up to 1.9 cm in size, possible intramural fibroid. Additional fluid attenuation structure towards the cervix, possible nabothian cyst. No concerning adnexal lesions.  Other: No abdominopelvic free fluid or free gas. No bowel containing hernias. Laparotomy band reservoir terminating to the right of midline at the mid abdomen. Intact catheter tubing.  Musculoskeletal: Multilevel degenerative changes are present in  the imaged portions of the spine. Features most pronounced L5-S1. Minimal degenerative changes in the hips and pelvis. No acute osseous abnormality or suspicious osseous lesion.  IMPRESSION: 1. Laparotomy band is in place. Portion of the adjacent gastric wall appears to wrap over the superior margin but without evidence of erosion. Phi angle is within expected normal. No abnormality of the subcutaneous reservoir, discontinuity of the catheter or other complication is seen. 2. Circumferential thickening of the distal thoracic esophagus, could reflect esophagitis. Could consider further evaluation with direct visualization as clinically warranted. 3. Slight fecalization of distal small bowel contents without mechanical obstruction. Findings are nonspecific, but can be  seen with slowed intestinal transit/constipation. 4. Hypoattenuating focus towards the lower uterine segment measuring up to 1.9 cm in size, possible intramural fibroid. Additional fluid attenuation structure towards the cervix, possible nabothian cyst. Could be further evaluated with pelvic ultrasound on a nonemergent basis as clinically warranted.   Electronically Signed   By: Kreg Shropshire M.D.   On: 09/01/2020 18:29  Within last 24 hrs: No results found.  Assessment    Biliary dyskinesia with lap band.  Patient Active Problem List   Diagnosis Date Noted  . Transient hypertension 08/10/2020  . Sciatica of left side 02/18/2020  . Chronic bilateral low back pain with left-sided sciatica 02/18/2020  . Bilateral hip pain 02/18/2020  . Bursitis of left hip 02/18/2020  . Insomnia 02/18/2020  . Abnormal liver ultrasound 07/29/2019  . Chronic RUQ pain 07/25/2019  . Chronic right shoulder pain 07/25/2019  . Rotator cuff tendonitis, right 06/18/2019  . Greater trochanteric bursitis of left hip 06/18/2019  . Chronic right-sided low back pain with right-sided sciatica 07/05/2018  . Forgetfulness 12/05/2016  . Headache 04/18/2016  . History of laparoscopic adjustable gastric banding, APS, 02/01/2010+ HH repair 07/10/2012    Plan    F/u with Dr. Daphine Deutscher and reschedule her care as needed.   Face-to-face time spent with the patient and accompanying care providers(if present) was 25 minutes, with more than 50% of the time spent counseling, educating, and coordinating care of the patient.      Campbell Lerner M.D., FACS 09/09/2020, 10:04 AM

## 2020-11-04 ENCOUNTER — Other Ambulatory Visit: Payer: Self-pay | Admitting: Student

## 2020-11-04 ENCOUNTER — Other Ambulatory Visit: Payer: Self-pay

## 2020-11-04 ENCOUNTER — Other Ambulatory Visit: Payer: Self-pay | Admitting: Family Medicine

## 2020-11-04 ENCOUNTER — Ambulatory Visit
Admission: RE | Admit: 2020-11-04 | Discharge: 2020-11-04 | Disposition: A | Discharge status: Home / Self Care | Payer: 59 | Source: Ambulatory Visit | Attending: Family Medicine | Admitting: Family Medicine

## 2020-11-04 DIAGNOSIS — R928 Other abnormal and inconclusive findings on diagnostic imaging of breast: Secondary | ICD-10-CM

## 2020-11-08 NOTE — Patient Instructions (Signed)
DUE TO COVID-19 ONLY ONE VISITOR IS ALLOWED TO COME WITH YOU AND STAY IN THE WAITING ROOM ONLY DURING PRE OP AND PROCEDURE DAY OF SURGERY. THE 1 VISITOR  MAY VISIT WITH YOU AFTER SURGERY IN YOUR PRIVATE ROOM DURING VISITING HOURS ONLY!               Tiffany Ayers   Your procedure is scheduled on: 11/16/20   Report to [facility] Main  Entrance   Report to short stay at 5:15 AM     Call this number if you have problems the morning of surgery [phone]    Remember: Do not eat food or drink liquids :After Midnight  . BRUSH YOUR TEETH MORNING OF SURGERY AND RINSE YOUR MOUTH OUT, NO CHEWING GUM CANDY OR MINTS.     Take these medicines the morning of surgery with A SIP OF WATER: none                                 You may not have any metal on your body including hair pins and              piercings  Do not wear jewelry, make-up, lotions, powders or perfumes, deodorant             Do not wear nail polish on your fingernails.  Do not shave  48 hours prior to surgery.     Do not bring valuables to the hospital. [facility] IS NOT             RESPONSIBLE   FOR VALUABLES.  Contacts, dentures or bridgework may not be worn into surgery.  Leave suitcase in the car. After surgery it may be brought to your room.     Patients discharged the day of surgery will not be allowed to drive home.  IF YOU ARE HAVING SURGERY AND GOING HOME THE SAME DAY, YOU MUST HAVE AN ADULT TO DRIVE YOU HOME AND BE WITH YOU FOR 24 HOURS.  YOU MAY GO HOME BY TAXI OR UBER OR ORTHERWISE, BUT AN ADULT MUST ACCOMPANY YOU HOME AND STAY WITH YOU FOR 24 HOURS.  Name and phone number of your driver:  Special Instructions: N/A              Please read over the following fact sheets you were given: _____________________________________________________________________             [facility] - Preparing for Surgery Before surgery, you can play an important role.  Because skin is not sterile, your skin  needs to be as free of germs as possible.  You can reduce the number of germs on your skin by washing with CHG (chlorahexidine gluconate) soap before surgery.  CHG is an antiseptic cleaner which kills germs and bonds with the skin to continue killing germs even after washing. Please DO NOT use if you have an allergy to CHG or antibacterial soaps.  If your skin becomes reddened/irritated stop using the CHG and inform your nurse when you arrive at Short Stay. Do not shave (including legs and underarms) for at least 48 hours prior to the first CHG shower.  . Please follow these instructions carefully:  1.  Shower with CHG Soap the night before surgery and the  morning of Surgery.  2.  If you choose to wash your hair, wash your hair first as usual with your  normal  shampoo.  3.  After you shampoo, rinse your hair and body thoroughly to remove the  shampoo.                                        4.  Use CHG as you would any other liquid soap.  You can apply chg directly  to the skin and wash                       Gently with a scrungie or clean washcloth.  5.  Apply the CHG Soap to your body ONLY FROM THE NECK DOWN.   Do not use on face/ open                           Wound or open sores. Avoid contact with eyes, ears mouth and genitals (private parts).                       Wash face,  Genitals (private parts) with your normal soap.             6.  Wash thoroughly, paying special attention to the area where your surgery  will be performed.  7.  Thoroughly rinse your body with warm water from the neck down.  8.  DO NOT shower/wash with your normal soap after using and rinsing off  the CHG Soap.             9.  Pat yourself dry with a clean towel.            10.  Wear clean pajamas.            11.  Place clean sheets on your bed the night of your first shower and do not  sleep with pets. Day of Surgery : Do not apply any lotions/deodorants the morning of surgery.  Please wear clean clothes to the  hospital/surgery center.  FAILURE TO FOLLOW THESE INSTRUCTIONS MAY RESULT IN THE CANCELLATION OF YOUR SURGERY PATIENT SIGNATURE_________________________________  NURSE SIGNATURE__________________________________  ________________________________________________________________________

## 2020-11-09 ENCOUNTER — Encounter (HOSPITAL_COMMUNITY): Payer: Self-pay

## 2020-11-09 ENCOUNTER — Encounter (HOSPITAL_COMMUNITY)
Admission: RE | Admit: 2020-11-09 | Discharge: 2020-11-09 | Disposition: A | Discharge status: Home / Self Care | Payer: 59 | Source: Ambulatory Visit | Attending: Surgery | Admitting: Surgery

## 2020-11-09 ENCOUNTER — Other Ambulatory Visit: Payer: Self-pay

## 2020-11-09 DIAGNOSIS — Z01812 Encounter for preprocedural laboratory examination: Secondary | ICD-10-CM | POA: Diagnosis not present

## 2020-11-09 HISTORY — DX: Gastro-esophageal reflux disease without esophagitis: K21.9

## 2020-11-09 HISTORY — DX: Headache, unspecified: R51.9

## 2020-11-09 LAB — COMPREHENSIVE METABOLIC PANEL
ALT: 11 U/L (ref 0–44)
AST: 14 U/L — ABNORMAL LOW (ref 15–41)
Albumin: 3.9 g/dL (ref 3.5–5.0)
Alkaline Phosphatase: 61 U/L (ref 38–126)
Anion gap: 5 (ref 5–15)
BUN: 15 mg/dL (ref 6–20)
CO2: 27 mmol/L (ref 22–32)
Calcium: 9 mg/dL (ref 8.9–10.3)
Chloride: 106 mmol/L (ref 98–111)
Creatinine, Ser: 0.78 mg/dL (ref 0.44–1.00)
GFR, Estimated: >60 mL/min (ref >60)
Glucose, Bld: 101 mg/dL — ABNORMAL HIGH (ref 70–99)
Potassium: 4.1 mmol/L (ref 3.5–5.1)
Sodium: 138 mmol/L (ref 135–145)
Total Bilirubin: 0.6 mg/dL (ref 0.3–1.2)
Total Protein: 6.8 g/dL (ref 6.5–8.1)

## 2020-11-09 LAB — CBC
HCT: 40.8 % (ref 36.0–46.0)
Hemoglobin: 13.4 g/dL (ref 12.0–15.0)
MCH: 29.9 pg (ref 26.0–34.0)
MCHC: 32.8 g/dL (ref 30.0–36.0)
MCV: 91.1 fL (ref 80.0–100.0)
Platelets: 235 10*3/uL (ref 150–400)
RBC: 4.48 MIL/uL (ref 3.87–5.11)
RDW: 13.4 % (ref 11.5–15.5)
WBC: 5.9 10*3/uL (ref 4.0–10.5)
nRBC: 0 % (ref 0.0–0.2)

## 2020-11-09 NOTE — Progress Notes (Signed)
COVID Vaccine Completed:No Date COVID Vaccine completed: COVID vaccine manufacturer: Pfizer    [employer]   PCP - Carren Rang PA Cardiologist - no  Chest x-ray - no EKG - 09/02/20-epic Stress Test - no ECHO - no Cardiac Cath - no Pacemaker/ICD device last checked:NA  Sleep Study - no CPAP -   Fasting Blood Sugar -NA  Checks Blood Sugar _____ times a day  Blood Thinner Instructions:NA Aspirin Instructions: Last Dose:  Anesthesia review:   Patient denies shortness of breath, fever, cough and chest pain at PAT appointment Pt can climb 3 flights of stairs,do housework and ADLs with out any SOB  Patient verbalized understanding of instructions that were given to them at the PAT appointment. Patient was also instructed that they will need to review over the PAT instructions again at home before surgery.Yes

## 2020-11-12 ENCOUNTER — Other Ambulatory Visit (HOSPITAL_COMMUNITY): Payer: 59

## 2020-11-15 ENCOUNTER — Encounter (HOSPITAL_COMMUNITY): Payer: Self-pay | Admitting: Surgery

## 2020-11-15 MED ORDER — BUPIVACAINE LIPOSOME 1.3 % IJ SUSP
20.0000 mL | Freq: Once | INTRAMUSCULAR | Status: DC
  Filled 2020-11-15: qty 20

## 2020-11-15 NOTE — Anesthesia Preprocedure Evaluation (Addendum)
Anesthesia Evaluation  Patient identified by MRN, date of birth, ID band Patient awake    Reviewed: Allergy & Precautions, NPO status , Patient's Chart, lab work & pertinent test results  Airway Mallampati: III  TM Distance: >3 FB Neck ROM: Full    Dental no notable dental hx. (+) Teeth Intact, Dental Advisory Given   Pulmonary neg pulmonary ROS,    Pulmonary exam normal breath sounds clear to auscultation       Cardiovascular hypertension, Normal cardiovascular exam Rhythm:Regular Rate:Normal     Neuro/Psych  Headaches,  Neuromuscular disease negative psych ROS   GI/Hepatic GERD  Medicated and Controlled,Abnormal liver US Chronic RUQ pain Biliary dyskinesia S/P Laparoscopic Gastric Band   Endo/Other  Obesity  Renal/GU negative Renal ROS  negative genitourinary   Musculoskeletal   Abdominal (+) + obese,   Peds  Hematology Hx/o Antiphospholipid Ab   Anesthesia Other Findings   Reproductive/Obstetrics                           Anesthesia Physical Anesthesia Plan  ASA: 2  Anesthesia Plan: General   Post-op Pain Management:    Induction: Intravenous  PONV Risk Score and Plan: 4 or greater and Scopolamine patch - Pre-op, Treatment may vary due to age or medical condition, Dexamethasone, Ondansetron and Midazolam  Airway Management Planned: Oral ETT  Additional Equipment:   Intra-op Plan:   Post-operative Plan: Extubation in OR  Informed Consent: I have reviewed the patients History and Physical, chart, labs and discussed the procedure including the risks, benefits and alternatives for the proposed anesthesia with the patient or authorized representative who has indicated his/her understanding and acceptance.     Dental advisory given  Plan Discussed with: CRNA and Anesthesiologist  Anesthesia Plan Comments:        Anesthesia Quick Evaluation

## 2020-11-15 NOTE — H&P (Signed)
Chief Complaint:  upper abdominal pain  History of Present Illness:  Tiffany Ayers is an 45 y.o. female who with all of the fluid out of her lapband has had continued dysphagia and reflux.  We have discussed removal of her lapband and removal of her gallbladder since she has a low ejection fraction on HIDA.  All of the fluid has been removed from her band and she still has occasional food impaction causing her to regurgitate.    Past Medical History:  Diagnosis Date   Abnormal liver ultrasound 07/29/2019   Anti-phospholipid antibody syndrome (HCC)    clotting problems during pregnancy only with no problems with clotting  after.   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   Vaginal Pap smear, abnormal     Past Surgical History:  Procedure Laterality Date   CESAREAN SECTION  2004   CESAREAN SECTION  08/21/2011   Procedure: CESAREAN SECTION;  Surgeon: Sherron Monday, MD;  Location: [facility];  Service: Gynecology;  Laterality: N/A;   CESAREAN SECTION WITH BILATERAL TUBAL LIGATION Bilateral 02/01/2013   Procedure: REPEAT CESAREAN SECTION WITH BILATERAL TUBAL LIGATION;  Surgeon: Catalina Antigua, MD;  Location: [facility];  Service: Obstetrics;  Laterality: Bilateral;   KNEE ARTHROSCOPY  06/05/1998   LAPAROSCOPIC GASTRIC BANDING  2012   TONSILLECTOMY     TONSILLECTOMY      No current facility-administered medications for this encounter.   Current Outpatient Medications  Medication Sig Dispense Refill   ibuprofen (ADVIL) 200 MG tablet Take 400 mg by mouth every 6 (six) hours as needed for moderate pain.     dicyclomine (BENTYL) 10 MG capsule Take 1 capsule (10 mg total) by mouth 4 (four) times daily -  before meals and at bedtime for 14 days. (Patient not taking: Reported on 11/02/2020) 56 capsule 0   naproxen (NAPROSYN) 500 MG tablet Take 1 tablet (500 mg total) by mouth 2 (two) times daily with a  meal. 30 tablet 0   sucralfate (CARAFATE) 1 GM/10ML suspension Take 10 mLs (1 g total) by mouth 4 (four) times daily -  with meals and at bedtime. (Patient not taking: Reported on 11/02/2020) 420 mL 1   Tramadol and Vicodin [hydrocodone-acetaminophen] Family History  Problem Relation Age of Onset   Healthy Mother    Endometriosis Mother    Healthy Father    Diabetes Maternal Grandmother    Heart disease Maternal Grandmother    Endometriosis Maternal Grandmother    Cleft lip Sister    Heart disease Maternal Grandfather    Social History:   reports that she has never smoked. She has never used smokeless tobacco. She reports that she does not drink alcohol and does not use drugs.   REVIEW OF SYSTEMS : Negative except for see problem list  Physical Exam:   There were no vitals taken for this visit. There is no height or weight on file to calculate BMI.  Gen:  WDWN WF NAD  Neurological: Alert and oriented to person, place, and time. Motor and sensory function is grossly intact  Head: Normocephalic and atraumatic.  Eyes: Conjunctivae are normal. Pupils are equal, round, and reactive to light. No scleral icterus.  Neck: Normal range of motion. Neck supple. No tracheal deviation or thyromegaly present.  Cardiovascular:  SR without murmurs or gallops.  No carotid bruits Breast:  not examined Respiratory: Effort normal.  No respiratory distress. No chest wall tenderness. Breath sounds normal.  No wheezes, rales or rhonchi.  Abdomen:  palpable port GU:  not examined Musculoskeletal: Normal range of motion. Extremities are nontender. No cyanosis, edema or clubbing noted Lymphadenopathy: No cervical, preauricular, postauricular or axillary adenopathy is present Skin: Skin is warm and dry. No rash noted. No diaphoresis. No erythema. No pallor. Pscyh: Normal mood and affect. Behavior is normal. Judgment and thought content normal.   LABORATORY RESULTS: No results found for this or any  previous visit (from the past 48 hour(s)).   RADIOLOGY RESULTS: No results found.  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  I have discussed lap and robotic removal of gallbladder and removal of lapband.      Matt B. Daphine Deutscher, MD, [facility] [phone] beeper [phone]  11/15/2020 7:18 AM    a

## 2020-11-16 ENCOUNTER — Ambulatory Visit (HOSPITAL_COMMUNITY): Payer: 59 | Admitting: Certified Registered Nurse Anesthetist

## 2020-11-16 ENCOUNTER — Ambulatory Visit (HOSPITAL_COMMUNITY)
Admission: RE | Admit: 2020-11-16 | Discharge: 2020-11-16 | Disposition: A | Discharge status: Home / Self Care | Payer: 59 | Attending: Surgery | Admitting: Surgery

## 2020-11-16 ENCOUNTER — Encounter (HOSPITAL_COMMUNITY)
Admission: RE | Disposition: A | Discharge status: Home / Self Care | Payer: Self-pay | Source: Home / Self Care | Attending: Surgery

## 2020-11-16 ENCOUNTER — Encounter (HOSPITAL_COMMUNITY): Payer: Self-pay | Admitting: Surgery

## 2020-11-16 ENCOUNTER — Ambulatory Visit (HOSPITAL_COMMUNITY): Payer: 59

## 2020-11-16 DIAGNOSIS — R1310 Dysphagia, unspecified: Secondary | ICD-10-CM | POA: Diagnosis not present

## 2020-11-16 DIAGNOSIS — K828 Other specified diseases of gallbladder: Secondary | ICD-10-CM

## 2020-11-16 DIAGNOSIS — Z9884 Bariatric surgery status: Secondary | ICD-10-CM | POA: Insufficient documentation

## 2020-11-16 DIAGNOSIS — K811 Chronic cholecystitis: Secondary | ICD-10-CM | POA: Insufficient documentation

## 2020-11-16 HISTORY — PX: CHOLECYSTECTOMY: SHX55

## 2020-11-16 LAB — PREGNANCY, URINE: Preg Test, Ur: NEGATIVE

## 2020-11-16 SURGERY — REMOVAL, GASTRIC BAND, LAPAROSCOPIC
Anesthesia: General | Site: Abdomen

## 2020-11-16 MED ORDER — MIDAZOLAM HCL 2 MG/2ML IJ SOLN
INTRAMUSCULAR | Status: AC
  Filled 2020-11-16: qty 2

## 2020-11-16 MED ORDER — PROPOFOL 10 MG/ML IV BOLUS
INTRAVENOUS | Status: DC | PRN
  Administered 2020-11-16: 180 mg via INTRAVENOUS

## 2020-11-16 MED ORDER — EPHEDRINE SULFATE 50 MG/ML IJ SOLN
INTRAMUSCULAR | Status: DC | PRN
  Administered 2020-11-16: 5 mg via INTRAVENOUS

## 2020-11-16 MED ORDER — LIDOCAINE HCL (CARDIAC) PF 100 MG/5ML IV SOSY
PREFILLED_SYRINGE | INTRAVENOUS | Status: DC | PRN
  Administered 2020-11-16: 100 mg via INTRAVENOUS

## 2020-11-16 MED ORDER — ONDANSETRON HCL 4 MG/2ML IJ SOLN: 4.0000 mg | Freq: Once | INTRAMUSCULAR | Status: DC | PRN

## 2020-11-16 MED ORDER — DEXAMETHASONE SODIUM PHOSPHATE 10 MG/ML IJ SOLN
INTRAMUSCULAR | Status: AC
  Filled 2020-11-16: qty 1

## 2020-11-16 MED ORDER — MIDAZOLAM HCL 5 MG/5ML IJ SOLN
INTRAMUSCULAR | Status: DC | PRN
  Administered 2020-11-16: 2 mg via INTRAVENOUS

## 2020-11-16 MED ORDER — OXYCODONE HCL 5 MG PO TABS
5.0000 mg | ORAL_TABLET | ORAL | Status: DC | PRN
  Administered 2020-11-16: 5 mg via ORAL

## 2020-11-16 MED ORDER — CEFAZOLIN SODIUM-DEXTROSE 2-4 GM/100ML-% IV SOLN
2.0000 g | INTRAVENOUS | Status: AC
  Administered 2020-11-16: 2 g via INTRAVENOUS
  Filled 2020-11-16: qty 100

## 2020-11-16 MED ORDER — ONDANSETRON HCL 4 MG/2ML IJ SOLN
INTRAMUSCULAR | Status: DC | PRN
  Administered 2020-11-16: 4 mg via INTRAVENOUS

## 2020-11-16 MED ORDER — 0.9 % SODIUM CHLORIDE (POUR BTL) OPTIME
TOPICAL | Status: DC | PRN
  Administered 2020-11-16: 1000 mL

## 2020-11-16 MED ORDER — FENTANYL CITRATE (PF) 250 MCG/5ML IJ SOLN
INTRAMUSCULAR | Status: AC
  Filled 2020-11-16: qty 5

## 2020-11-16 MED ORDER — SODIUM CHLORIDE (PF) 0.9 % IJ SOLN
INTRAMUSCULAR | Status: DC | PRN
  Administered 2020-11-16: 10 mL

## 2020-11-16 MED ORDER — OXYCODONE HCL 5 MG PO TABS: 5.0000 mg | ORAL_TABLET | Freq: Four times a day (QID) | ORAL | 0 refills | Status: DC | PRN

## 2020-11-16 MED ORDER — LACTATED RINGERS IV SOLN: INTRAVENOUS | Status: DC

## 2020-11-16 MED ORDER — ROCURONIUM BROMIDE 10 MG/ML (PF) SYRINGE
PREFILLED_SYRINGE | INTRAVENOUS | Status: AC
  Filled 2020-11-16: qty 10

## 2020-11-16 MED ORDER — SODIUM CHLORIDE (PF) 0.9 % IJ SOLN
INTRAMUSCULAR | Status: DC | PRN
  Administered 2020-11-16: 4.5 mL

## 2020-11-16 MED ORDER — ACETAMINOPHEN 500 MG PO TABS
1000.0000 mg | ORAL_TABLET | ORAL | Status: AC
  Administered 2020-11-16: 1000 mg via ORAL
  Filled 2020-11-16: qty 2

## 2020-11-16 MED ORDER — SCOPOLAMINE 1 MG/3DAYS TD PT72
1.0000 | MEDICATED_PATCH | TRANSDERMAL | Status: DC
  Administered 2020-11-16: 1.5 mg via TRANSDERMAL
  Filled 2020-11-16: qty 1

## 2020-11-16 MED ORDER — LACTATED RINGERS IR SOLN
Status: DC | PRN
  Administered 2020-11-16: 1000 mL

## 2020-11-16 MED ORDER — BUPIVACAINE LIPOSOME 1.3 % IJ SUSP
INTRAMUSCULAR | Status: DC | PRN
  Administered 2020-11-16: 20 mL

## 2020-11-16 MED ORDER — ROCURONIUM BROMIDE 100 MG/10ML IV SOLN
INTRAVENOUS | Status: DC | PRN
  Administered 2020-11-16: 60 mg via INTRAVENOUS
  Administered 2020-11-16: 20 mg via INTRAVENOUS
  Administered 2020-11-16: 10 mg via INTRAVENOUS

## 2020-11-16 MED ORDER — HEPARIN SODIUM (PORCINE) 5000 UNIT/ML IJ SOLN
5000.0000 [IU] | Freq: Once | INTRAMUSCULAR | Status: AC
  Administered 2020-11-16: 5000 [IU] via SUBCUTANEOUS
  Filled 2020-11-16: qty 1

## 2020-11-16 MED ORDER — CHLORHEXIDINE GLUCONATE 0.12 % MT SOLN
15.0000 mL | Freq: Once | OROMUCOSAL | Status: AC
  Administered 2020-11-16: 15 mL via OROMUCOSAL

## 2020-11-16 MED ORDER — ONDANSETRON HCL 4 MG/2ML IJ SOLN
INTRAMUSCULAR | Status: AC
  Filled 2020-11-16: qty 2

## 2020-11-16 MED ORDER — PROPOFOL 10 MG/ML IV BOLUS
INTRAVENOUS | Status: AC
  Filled 2020-11-16: qty 40

## 2020-11-16 MED ORDER — FENTANYL CITRATE (PF) 100 MCG/2ML IJ SOLN
INTRAMUSCULAR | Status: DC | PRN
  Administered 2020-11-16: 50 ug via INTRAVENOUS
  Administered 2020-11-16: 100 ug via INTRAVENOUS
  Administered 2020-11-16 (×2): 50 ug via INTRAVENOUS

## 2020-11-16 MED ORDER — SUGAMMADEX SODIUM 200 MG/2ML IV SOLN
INTRAVENOUS | Status: DC | PRN
  Administered 2020-11-16: 200 mg via INTRAVENOUS

## 2020-11-16 MED ORDER — DEXAMETHASONE SODIUM PHOSPHATE 10 MG/ML IJ SOLN
INTRAMUSCULAR | Status: DC | PRN
  Administered 2020-11-16: 10 mg via INTRAVENOUS

## 2020-11-16 MED ORDER — SODIUM CHLORIDE (PF) 0.9 % IJ SOLN
INTRAMUSCULAR | Status: AC
  Filled 2020-11-16: qty 10

## 2020-11-16 MED ORDER — ORAL CARE MOUTH RINSE: 15.0000 mL | Freq: Once | OROMUCOSAL | Status: AC

## 2020-11-16 MED ORDER — DEXMEDETOMIDINE (PRECEDEX) IN NS 20 MCG/5ML (4 MCG/ML) IV SYRINGE
PREFILLED_SYRINGE | INTRAVENOUS | Status: DC | PRN
  Administered 2020-11-16: 8 ug via INTRAVENOUS
  Administered 2020-11-16: 12 ug via INTRAVENOUS

## 2020-11-16 MED ORDER — OXYCODONE HCL 5 MG PO TABS
ORAL_TABLET | ORAL | Status: AC
  Filled 2020-11-16: qty 1

## 2020-11-16 MED ORDER — EPHEDRINE 5 MG/ML INJ
INTRAVENOUS | Status: AC
  Filled 2020-11-16: qty 10

## 2020-11-16 MED ORDER — HYDROMORPHONE HCL 1 MG/ML IJ SOLN
0.2500 mg | INTRAMUSCULAR | Status: DC | PRN
  Administered 2020-11-16 (×2): 0.25 mg via INTRAVENOUS

## 2020-11-16 MED ORDER — LIDOCAINE 2% (20 MG/ML) 5 ML SYRINGE
INTRAMUSCULAR | Status: AC
  Filled 2020-11-16: qty 5

## 2020-11-16 MED ORDER — HYDROMORPHONE HCL 1 MG/ML IJ SOLN
INTRAMUSCULAR | Status: AC
  Filled 2020-11-16: qty 1

## 2020-11-16 MED ORDER — CHLORHEXIDINE GLUCONATE CLOTH 2 % EX PADS: 6.0000 | MEDICATED_PAD | Freq: Once | CUTANEOUS | Status: DC

## 2020-11-16 SURGICAL SUPPLY — 48 items
ADH SKN CLS APL DERMABOND .7 (GAUZE/BANDAGES/DRESSINGS) ×2
APL SKNCLS STERI-STRIP NONHPOA (GAUZE/BANDAGES/DRESSINGS)
APL SWBSTK 6 STRL LF DISP (MISCELLANEOUS)
APPLICATOR COTTON TIP 6 STRL (MISCELLANEOUS) ×2 IMPLANT
APPLICATOR COTTON TIP 6IN STRL (MISCELLANEOUS)
APPLIER CLIP 5 13 M/L LIGAMAX5 (MISCELLANEOUS)
APPLIER CLIP ROT 10 11.4 M/L (STAPLE) ×4
APR CLP MED LRG 11.4X10 (STAPLE) ×2
APR CLP MED LRG 5 ANG JAW (MISCELLANEOUS)
BAG SPEC RTRVL 10 TROC 200 (ENDOMECHANICALS) ×2
BENZOIN TINCTURE PRP APPL 2/3 (GAUZE/BANDAGES/DRESSINGS) IMPLANT
CABLE HIGH FREQUENCY MONO STRZ (ELECTRODE) IMPLANT
CATH REDDICK CHOLANGI 4FR 50CM (CATHETERS) ×4 IMPLANT
CLIP APPLIE 5 13 M/L LIGAMAX5 (MISCELLANEOUS) IMPLANT
CLIP APPLIE ROT 10 11.4 M/L (STAPLE) ×2 IMPLANT
CLOSURE WOUND 1/2 X4 (GAUZE/BANDAGES/DRESSINGS)
COVER MAYO STAND STRL (DRAPES) ×4 IMPLANT
COVER SURGICAL LIGHT HANDLE (MISCELLANEOUS) ×4 IMPLANT
COVER WAND RF STERILE (DRAPES) IMPLANT
DECANTER SPIKE VIAL GLASS SM (MISCELLANEOUS) ×1 IMPLANT
DERMABOND ADVANCED (GAUZE/BANDAGES/DRESSINGS) ×2
DERMABOND ADVANCED .7 DNX12 (GAUZE/BANDAGES/DRESSINGS) ×2 IMPLANT
DRAPE C-ARM 42X120 X-RAY (DRAPES) ×4 IMPLANT
ELECT L-HOOK LAP 45CM DISP (ELECTROSURGICAL)
ELECT PENCIL ROCKER SW 15FT (MISCELLANEOUS) ×3 IMPLANT
ELECT REM PT RETURN 15FT ADLT (MISCELLANEOUS) ×4 IMPLANT
ELECTRODE L-HOOK LAP 45CM DISP (ELECTROSURGICAL) IMPLANT
GLOVE SURG ENC TEXT LTX SZ8 (GLOVE) ×4 IMPLANT
GOWN STRL REUS W/TWL XL LVL3 (GOWN DISPOSABLE) ×4 IMPLANT
HEMOSTAT SURGICEL 4X8 (HEMOSTASIS) IMPLANT
IV CATH 14GX2 1/4 (CATHETERS) ×4 IMPLANT
KIT BASIN OR (CUSTOM PROCEDURE TRAY) ×4 IMPLANT
KIT TURNOVER KIT A (KITS) ×4 IMPLANT
POUCH RETRIEVAL ECOSAC 10 (ENDOMECHANICALS) ×1 IMPLANT
POUCH RETRIEVAL ECOSAC 10MM (ENDOMECHANICALS) ×4
SCISSORS LAP 5X45 EPIX DISP (ENDOMECHANICALS) ×4 IMPLANT
SET IRRIG TUBING LAPAROSCOPIC (IRRIGATION / IRRIGATOR) ×4 IMPLANT
SET TUBE SMOKE EVAC HIGH FLOW (TUBING) ×4 IMPLANT
SLEEVE XCEL OPT CAN 5 100 (ENDOMECHANICALS) ×7 IMPLANT
STRIP CLOSURE SKIN 1/2X4 (GAUZE/BANDAGES/DRESSINGS) IMPLANT
SUT MNCRL AB 4-0 PS2 18 (SUTURE) ×8 IMPLANT
SYR 20ML LL LF (SYRINGE) ×4 IMPLANT
TOWEL OR 17X26 10 PK STRL BLUE (TOWEL DISPOSABLE) ×4 IMPLANT
TRAY LAPAROSCOPIC (CUSTOM PROCEDURE TRAY) ×4 IMPLANT
TROCAR BLADELESS 15MM (ENDOMECHANICALS) ×3 IMPLANT
TROCAR BLADELESS OPT 5 100 (ENDOMECHANICALS) ×4 IMPLANT
TROCAR XCEL BLUNT TIP 100MML (ENDOMECHANICALS) IMPLANT
TROCAR XCEL NON-BLD 11X100MML (ENDOMECHANICALS) IMPLANT

## 2020-11-16 NOTE — Anesthesia Postprocedure Evaluation (Signed)
Anesthesia Post Note  Patient: Tiffany Ayers  Procedure(s) Performed: LAPAROSCOPIC REMOVAL OF GASTRIC BAND (Abdomen) LAPAROSCOPIC CHOLECYSTECTOMY (Abdomen)     Patient location during evaluation: PACU Anesthesia Type: General Level of consciousness: awake and alert and oriented Pain management: pain level controlled Vital Signs Assessment: post-procedure vital signs reviewed and stable Respiratory status: spontaneous breathing, nonlabored ventilation and respiratory function stable Cardiovascular status: blood pressure returned to baseline and stable Postop Assessment: no apparent nausea or vomiting Anesthetic complications: no   No notable events documented.  Last Vitals:  Vitals:   11/16/20 1000 11/16/20 1015  BP: 120/77 133/75  Pulse: (!) 57 (!) 51  Resp: 14 12  Temp:    SpO2: 99% 95%    Last Pain:  Vitals:   11/16/20 1000  TempSrc:   PainSc: 4                  Yardley Beltran A.

## 2020-11-16 NOTE — Anesthesia Procedure Notes (Signed)
Procedure Name: Intubation Date/Time: 11/16/2020 7:29 AM Performed by: Jari Pigg, CRNA Pre-anesthesia Checklist: Patient identified, Emergency Drugs available, Suction available and Patient being monitored Patient Re-evaluated:Patient Re-evaluated prior to induction Oxygen Delivery Method: Circle system utilized Preoxygenation: Pre-oxygenation with 100% oxygen Induction Type: IV induction Ventilation: Mask ventilation without difficulty Laryngoscope Size: Mac and 3 Grade View: Grade II Tube type: Oral Number of attempts: 1 Airway Equipment and Method: Stylet and Oral airway Placement Confirmation: ETT inserted through vocal cords under direct vision, positive ETCO2 and breath sounds checked- equal and bilateral Secured at: 22 cm Tube secured with: Tape Dental Injury: Teeth and Oropharynx as per pre-operative assessment

## 2020-11-16 NOTE — Interval H&P Note (Signed)
History and Physical Interval Note:  11/16/2020 7:13 AM  Tiffany Ayers  has presented today for surgery, with the diagnosis of biliary dyskinesia.  The various methods of treatment have been discussed with the patient and family. After consideration of risks, benefits and other options for treatment, the patient has consented to  Procedure(s): LAPAROSCOPIC REMOVAL OF GASTRIC BAND (N/A) LAPAROSCOPIC CHOLECYSTECTOMY (N/A) UPPER GI ENDOSCOPY (N/A) as a surgical intervention.  The patient's history has been reviewed, patient examined, no change in status, stable for surgery.  I have reviewed the patient's chart and labs.  Questions were answered to the patient's satisfaction.     Valarie Merino

## 2020-11-16 NOTE — Transfer of Care (Signed)
Immediate Anesthesia Transfer of Care Note  Patient: Tiffany Ayers  Procedure(s) Performed: LAPAROSCOPIC REMOVAL OF GASTRIC BAND (Abdomen) LAPAROSCOPIC CHOLECYSTECTOMY (Abdomen)  Patient Location: PACU  Anesthesia Type:General  Level of Consciousness: oriented and drowsy  Airway & Oxygen Therapy: Patient Spontanous Breathing  Post-op Assessment: Report given to RN and Post -op Vital signs reviewed and stable  Post vital signs: Reviewed and stable  Last Vitals:  Vitals Value Taken Time  BP    Temp    Pulse 56 11/16/20 0936  Resp 17 11/16/20 0936  SpO2 97 % 11/16/20 0936  Vitals shown include unvalidated device data.  Last Pain:  Vitals:   11/16/20 0609  TempSrc: Oral  PainSc:       Patients Stated Pain Goal: 4 (11/16/20 0559)  Complications: No notable events documented.

## 2020-11-16 NOTE — Op Note (Addendum)
Tiffany Ayers  Primary Care Physician:  Carren Rang, Cordelia Poche    11/16/2020  9:41 AM  Procedure: Laparoscopic Cholecystectomy with intraoperative cholangiogram and removal of Lapband  Surgeon: Susy Frizzle B. Daphine Deutscher, MD, FACS Asst:  Feliciana Rossetti, MD  Anes:  General  Drains:  None  Findings: Chronic cholecystitis    Description of Procedure: The patient was taken to OR 1 and given general anesthesia.  The patient was prepped with chlorohexidine prep and draped sterilely. A time out was performed including identifying the patient and discussing their procedure.  Access to the abdomen was achieved with a 5 mm Optiview.  Port placement included four ports including a 15 mm in the port site.  The port was removed first.  The lapband was visualized, unbuckled and removed with the remaining tubing.    The gallbladder was visualized and appeared chronically adherent to the duodenum.   The fundus of the gallbaldder was grasped and the gallbladder was elevated. Traction on the infundibulum allowed for successful demonstration of the critical view. Inflammatory changes were chronic.  The cystic duct was identified and clipped up on the gallbladder and an incision was made in the cystic duct and the Reddick catheter was inserted after milking the cystic duct of any debris. A dynamic cholangiogram was performed which demonstrated which showed intrahepatic filling and free flow into the duodenum.    The cystic duct was then triple clipped and divided, the cystic artery was double clipped and divided and then the gallbladder was removed from the gallbladder bed. Removal of the gallbladder from the gallbladder bed was done with a slight spillage of bile.  The gallbladder was then placed in a bag and brought out through one of the trocar sites. The gallbladder bed was inspected and no bleeding or bile leaks were seen.   Laparoscopic visualization was used when closing fascial defects for the 15 mm trocar  sites.   Incisions were injected with Exparel as a tap block and closed with 4-0 Monocryl and Dermabond on the skin.  Sponge and needle count were correct.    The patient was taken to the recovery room in satisfactory condition.

## 2020-11-17 ENCOUNTER — Encounter (HOSPITAL_COMMUNITY): Payer: Self-pay | Admitting: Surgery

## 2020-11-17 LAB — SURGICAL PATHOLOGY

## 2021-06-28 IMAGING — US US ABDOMEN LIMITED
1 series · 14 of 25 positions shown · non-contrast
Comparison: 11/16/2009

CLINICAL DATA: Right upper quadrant abdominal pain for 1 year

EXAM:
ULTRASOUND ABDOMEN LIMITED RIGHT UPPER QUADRANT

[Series 1: us abdomen limited · 14 of 39 slices shown]
[im 1/39]
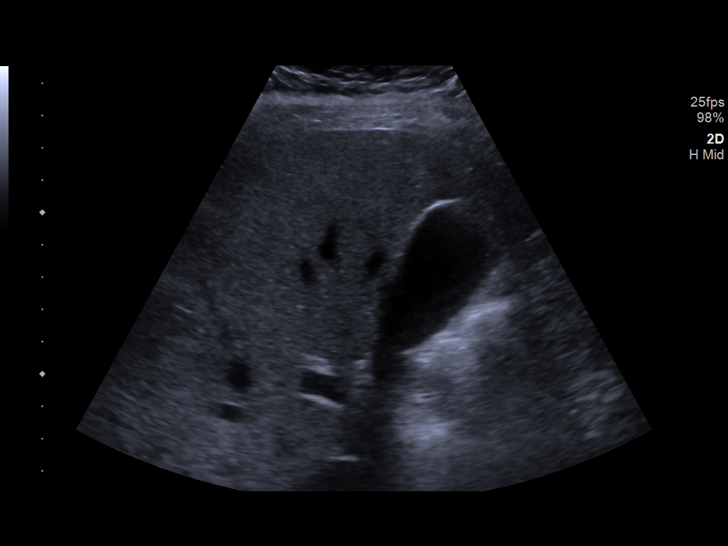
[im 4/39]
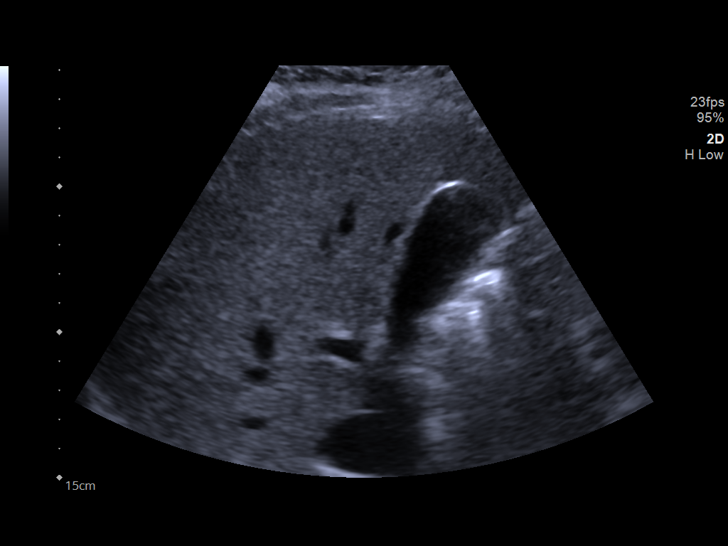
[im 7/39]
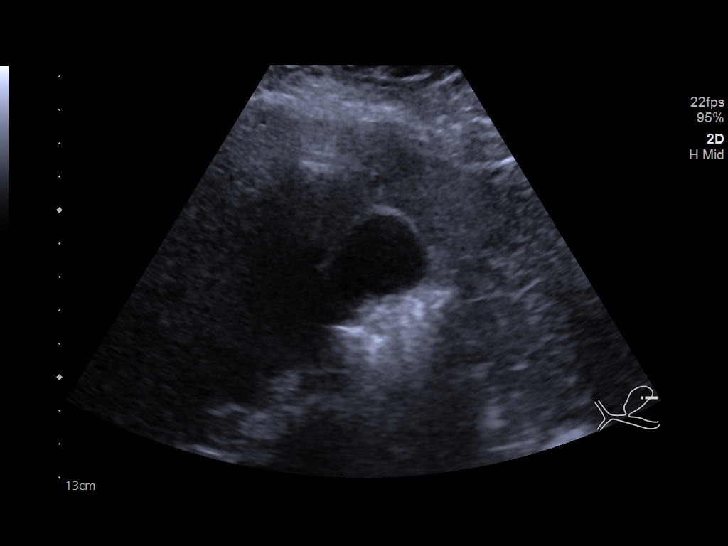
[im 10/39]
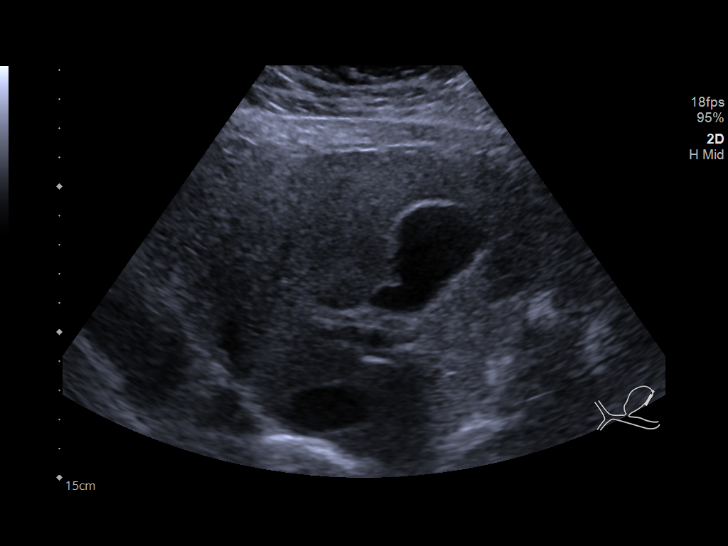
[im 13/39]
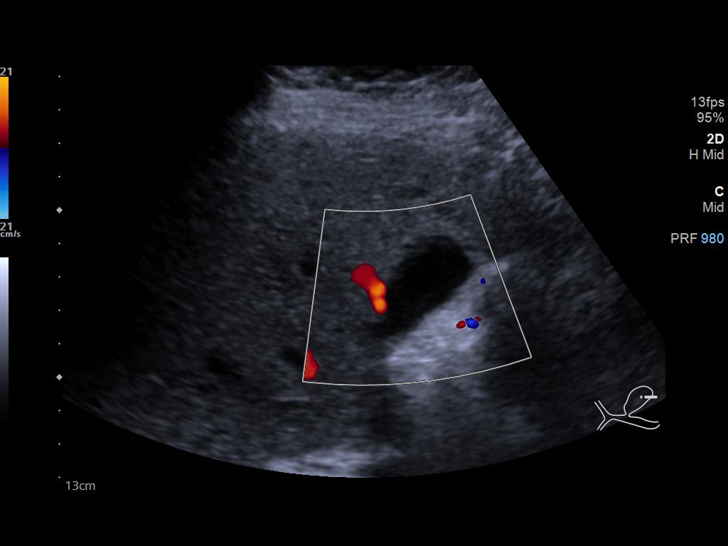
[im 15/39]
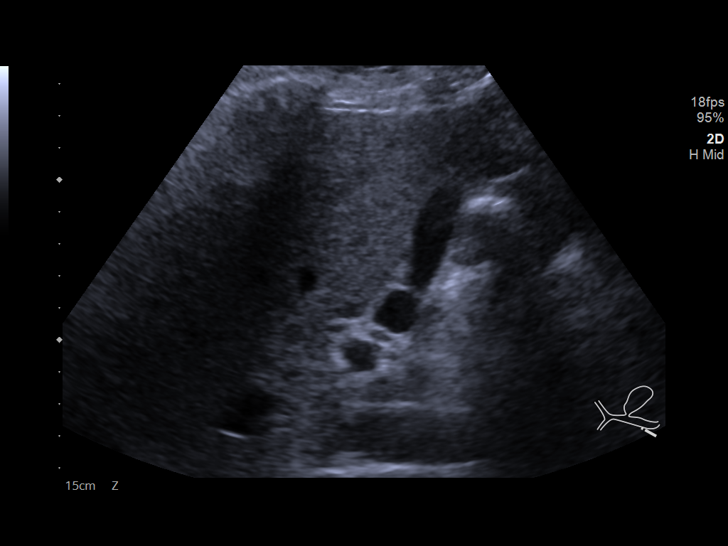
[im 18/39]
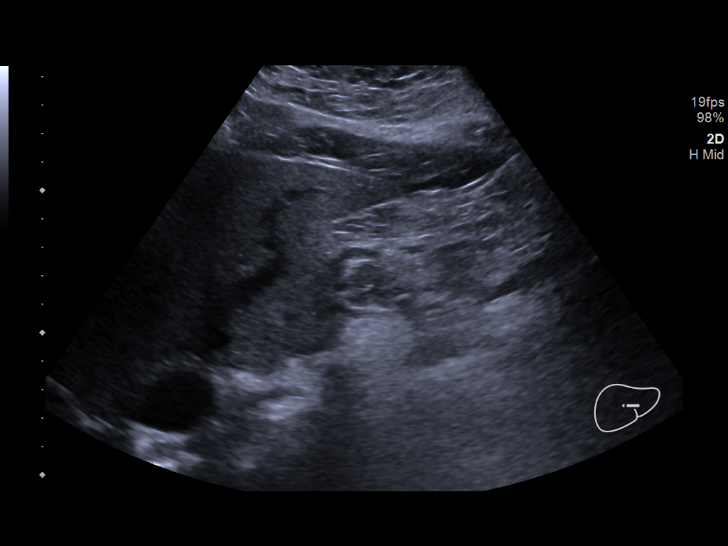
[im 21/39]
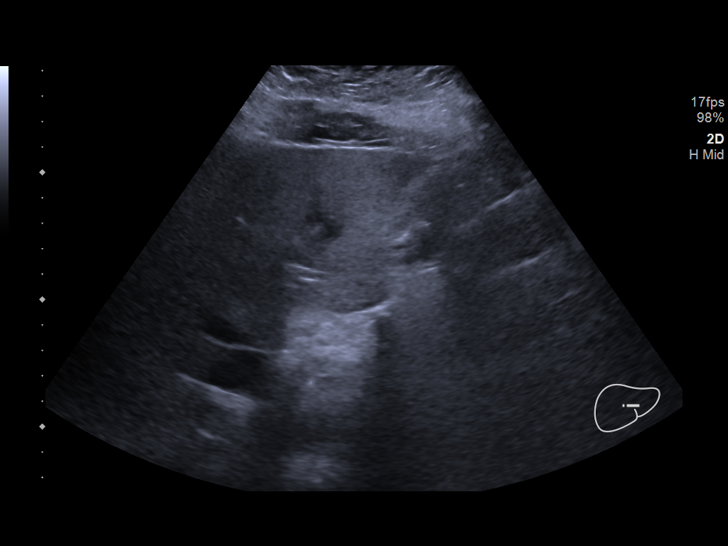
[im 24/39]
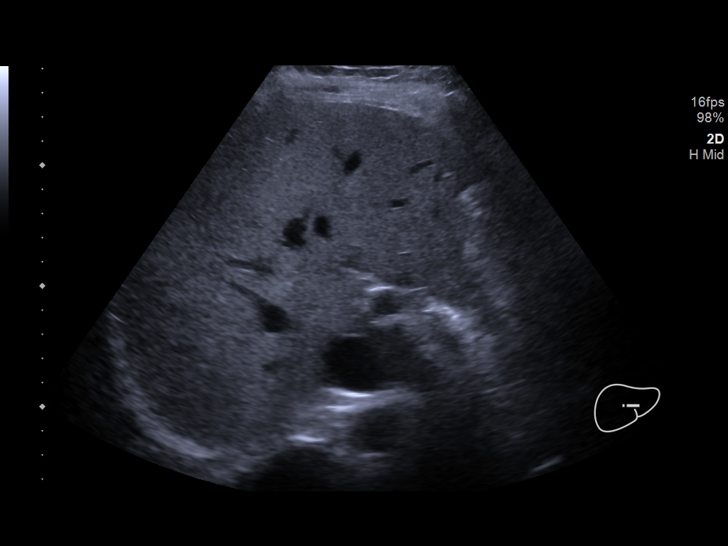
[im 26/39]
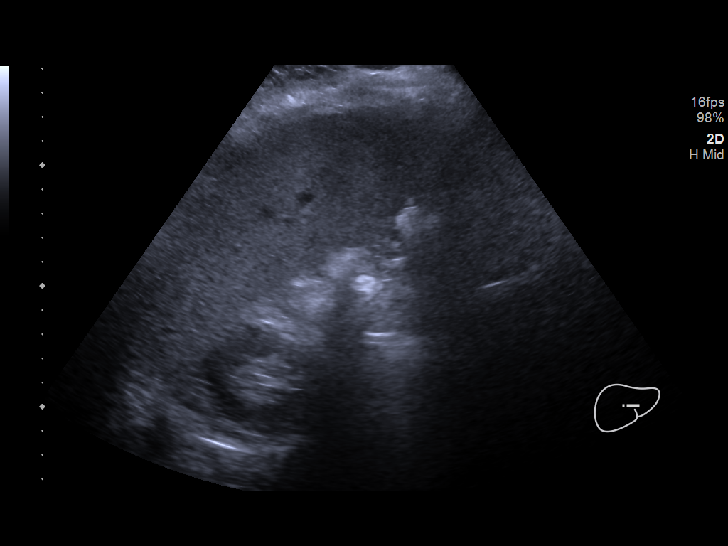
[im 29/39]
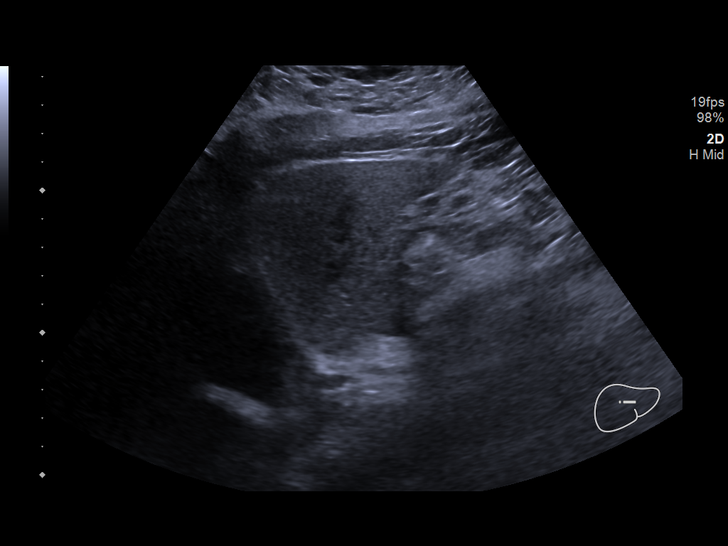
[im 32/39]
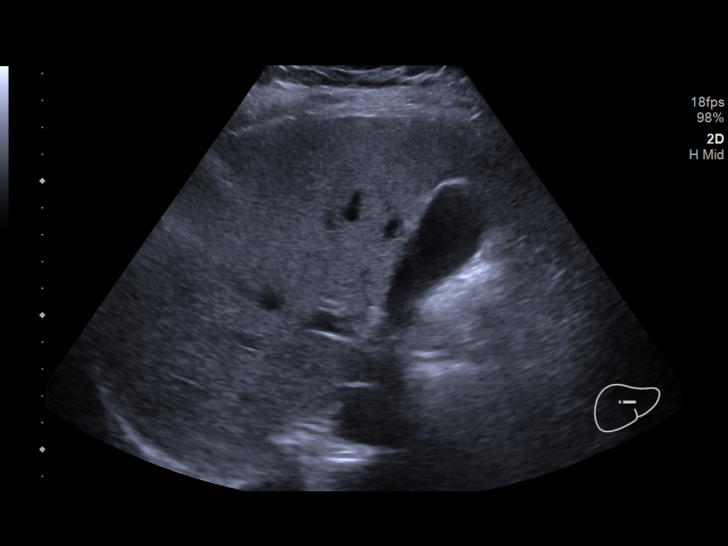
[im 35/39]
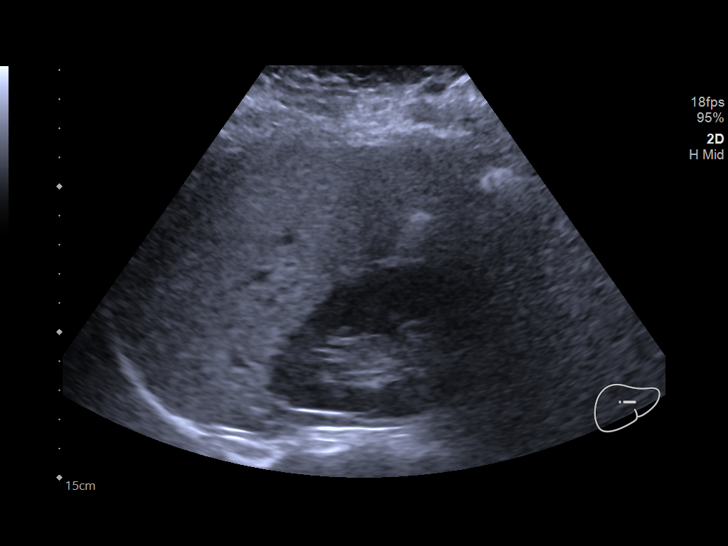
[im 39/39]
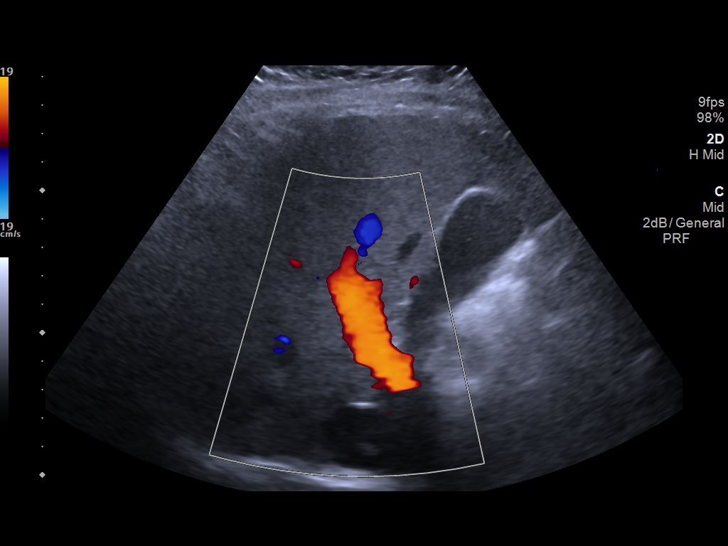

[14 of 25 positions shown; findings below may reference images not displayed]

FINDINGS: Gallbladder:

No gallstones or wall thickening visualized. No sonographic Murphy
sign noted by sonographer.

Common bile duct:

Diameter: 3 mm

Liver:

No focal lesion identified. Diffusely increased hepatic parenchymal
echogenicity. Portal vein is patent on color Doppler imaging with
normal direction of blood flow towards the liver.

Other: None.
IMPRESSION: 1. The echogenicity of the liver is increased. This is a nonspecific
finding but is most commonly seen with fatty infiltration of the
liver. There are no obvious focal liver lesions.
2. Unremarkable gallbladder.

## 2021-07-09 ENCOUNTER — Encounter: Payer: Self-pay | Admitting: Radiology

## 2021-09-07 IMAGING — US US BREAST*R* LIMITED INC AXILLA
1 series · 5 of 5 positions shown · non-contrast
Comparison: Baseline screening mammogram dated 09/26/2019.

CLINICAL DATA: Patient was called back from screening mammogram for
a right breast mass.

EXAM:
DIGITAL DIAGNOSTIC RIGHT MAMMOGRAM WITH TOMO
ULTRASOUND RIGHT BREAST

[Series 1: us breast*right* limited inc axilla · 0.06mm/px · 5 of 5 slices shown]
[im 1/5]
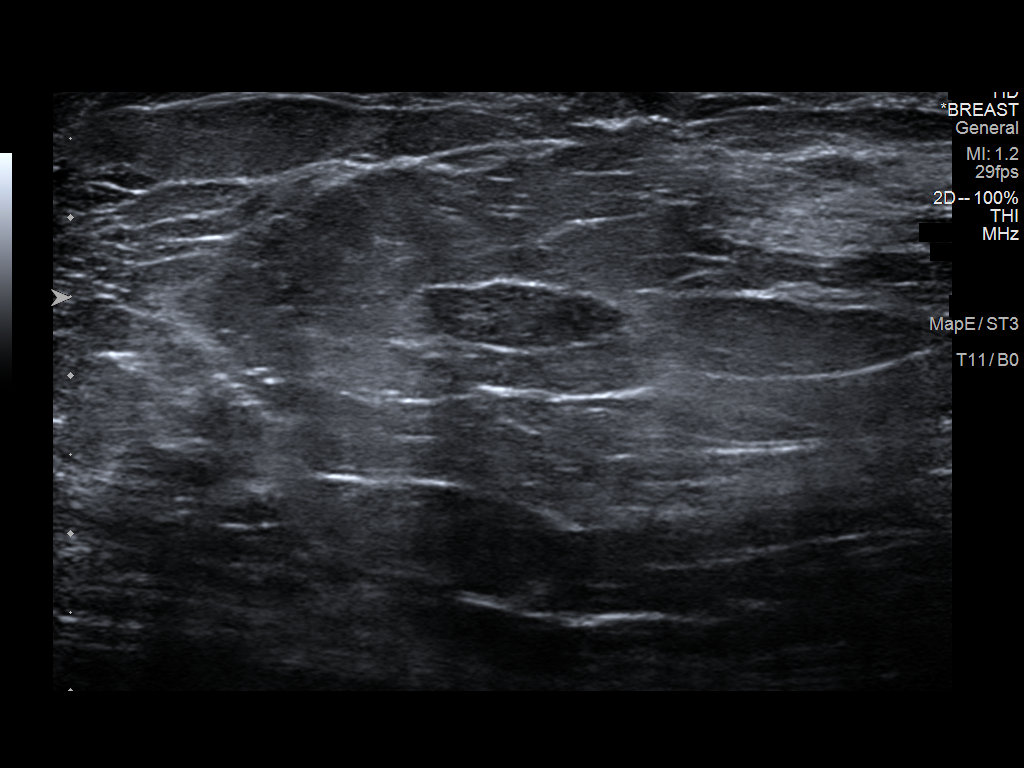
[im 2/5]
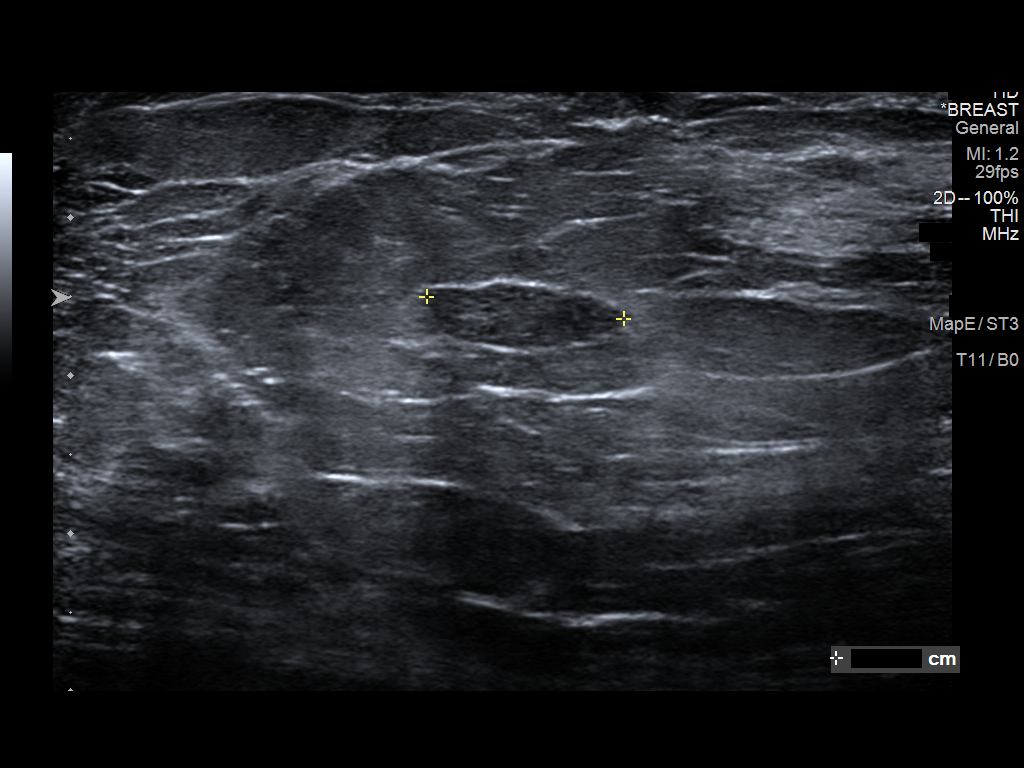
[im 3/5]
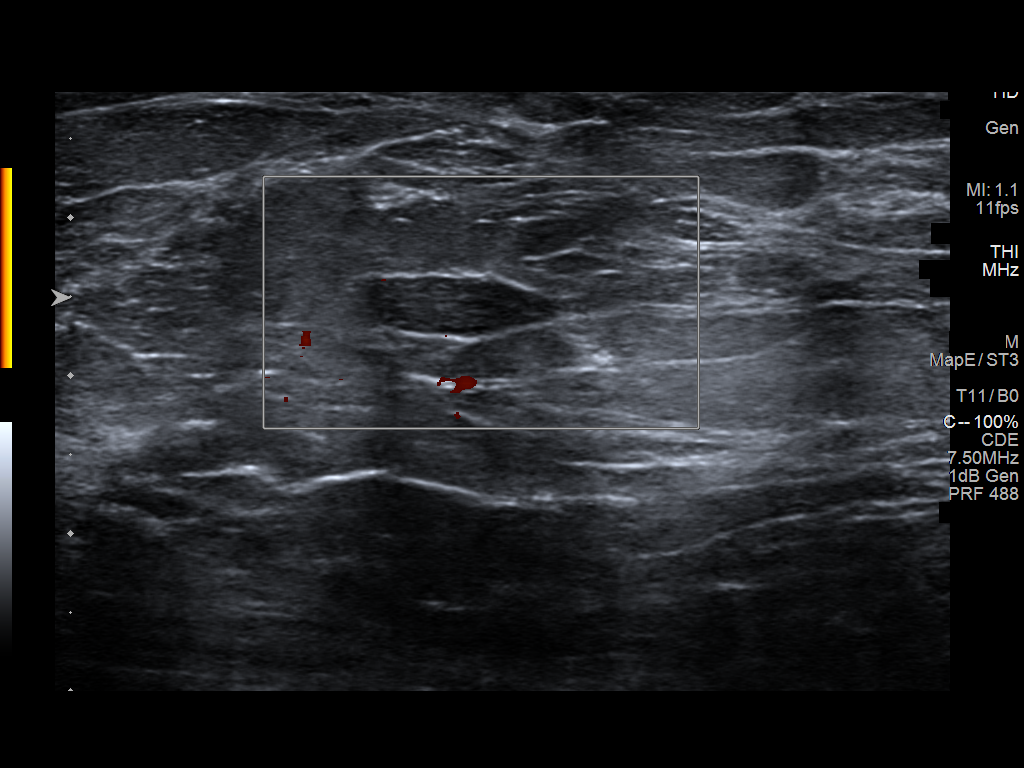
[im 4/5]
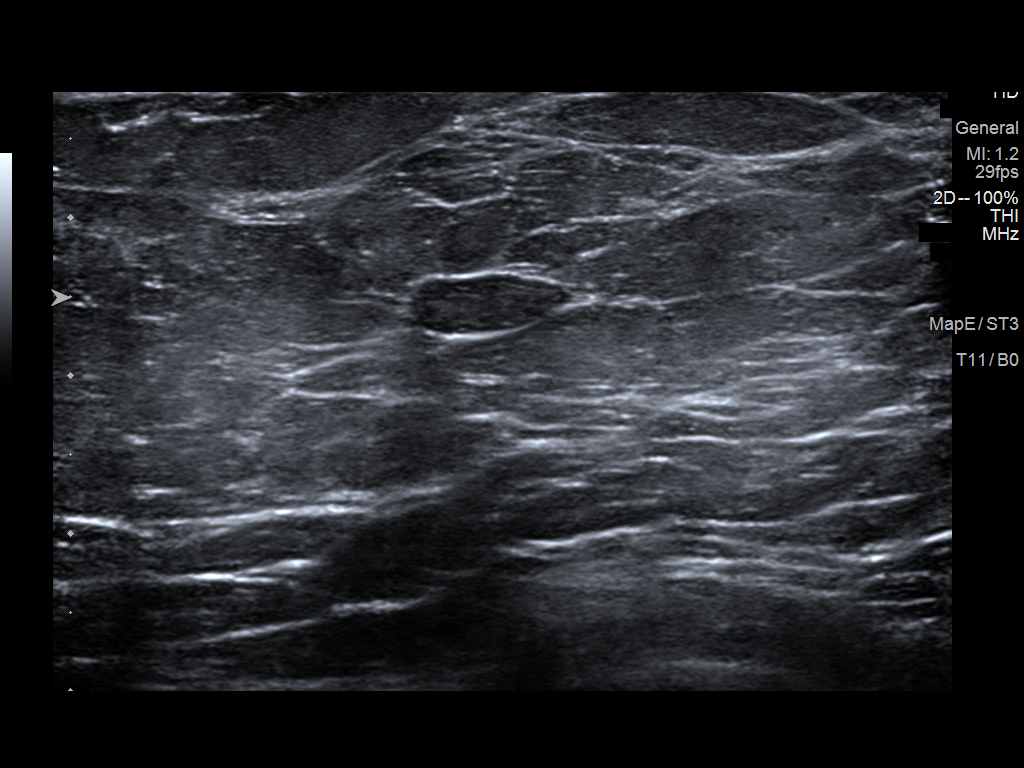
[im 5/5]
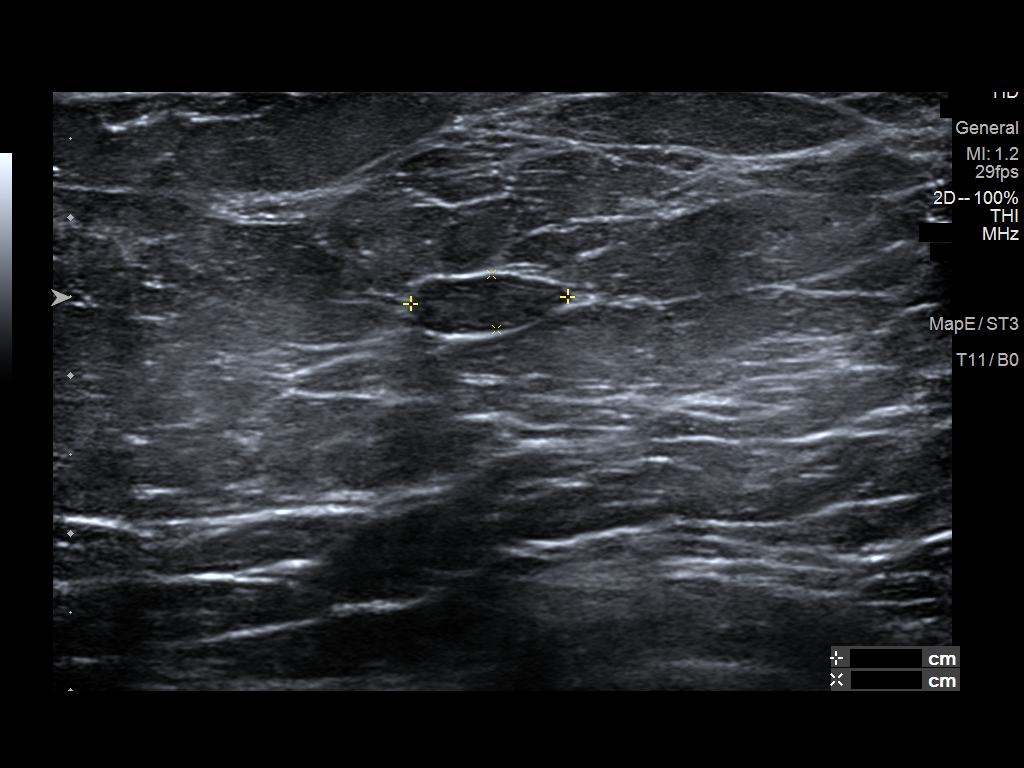

[5 of 5 positions shown; findings below may reference images not displayed]

ACR Breast Density Category b: There are scattered areas of
fibroglandular density.
FINDINGS: Additional imaging of the right breast was performed showing a 12 mm
circumscribed mass in the upper-outer quadrant of the breast. There
are no malignant type microcalcifications.

Targeted ultrasound is performed, showing a well-circumscribed
hypoechoic mass in the right breast at 10 o'clock 3 cm from the
nipple measuring 10 x 4 x 13 mm. It is likely a benign fibroadenoma.
IMPRESSION: Probable benign fibroadenoma in the right breast.

RECOMMENDATION:
Short-term interval follow-up right breast ultrasound in 6 months is
recommended.

I have discussed the findings and recommendations with the patient.
If applicable, a reminder letter will be sent to the patient
regarding the next appointment.

BI-RADS CATEGORY  3: Probably benign.

## 2021-09-07 IMAGING — MG MM DIGITAL DIAGNOSTIC UNILAT*R* W/ TOMO W/ CAD
4 series · 4 of 12 positions shown · non-contrast
Comparison: Baseline screening mammogram dated 09/26/2019.

CLINICAL DATA: Patient was called back from screening mammogram for
a right breast mass.

EXAM:
DIGITAL DIAGNOSTIC RIGHT MAMMOGRAM WITH TOMO
ULTRASOUND RIGHT BREAST

[R CC synth-2D]
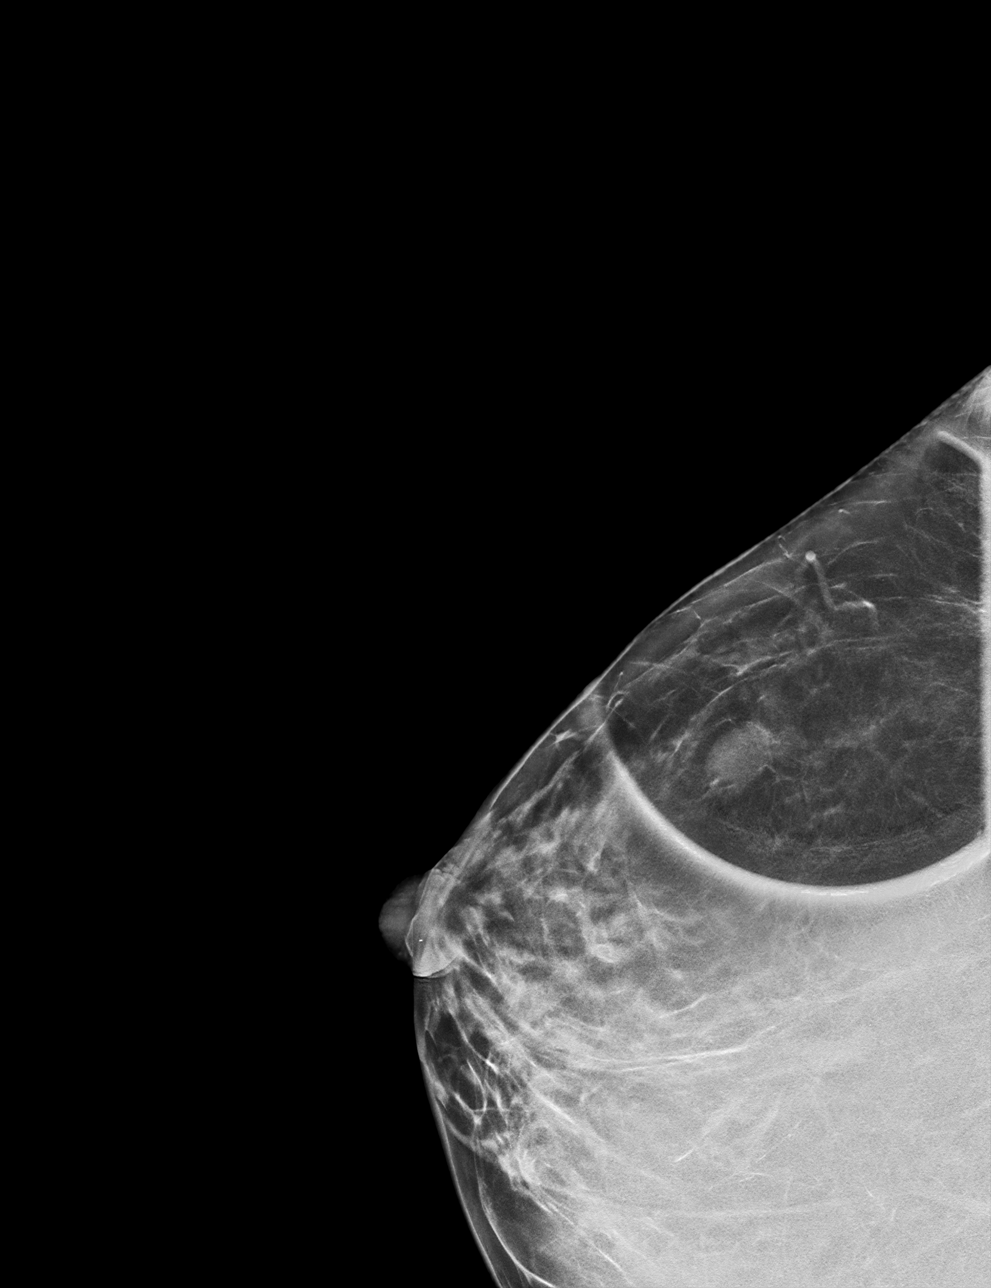

[R MLO synth-2D]
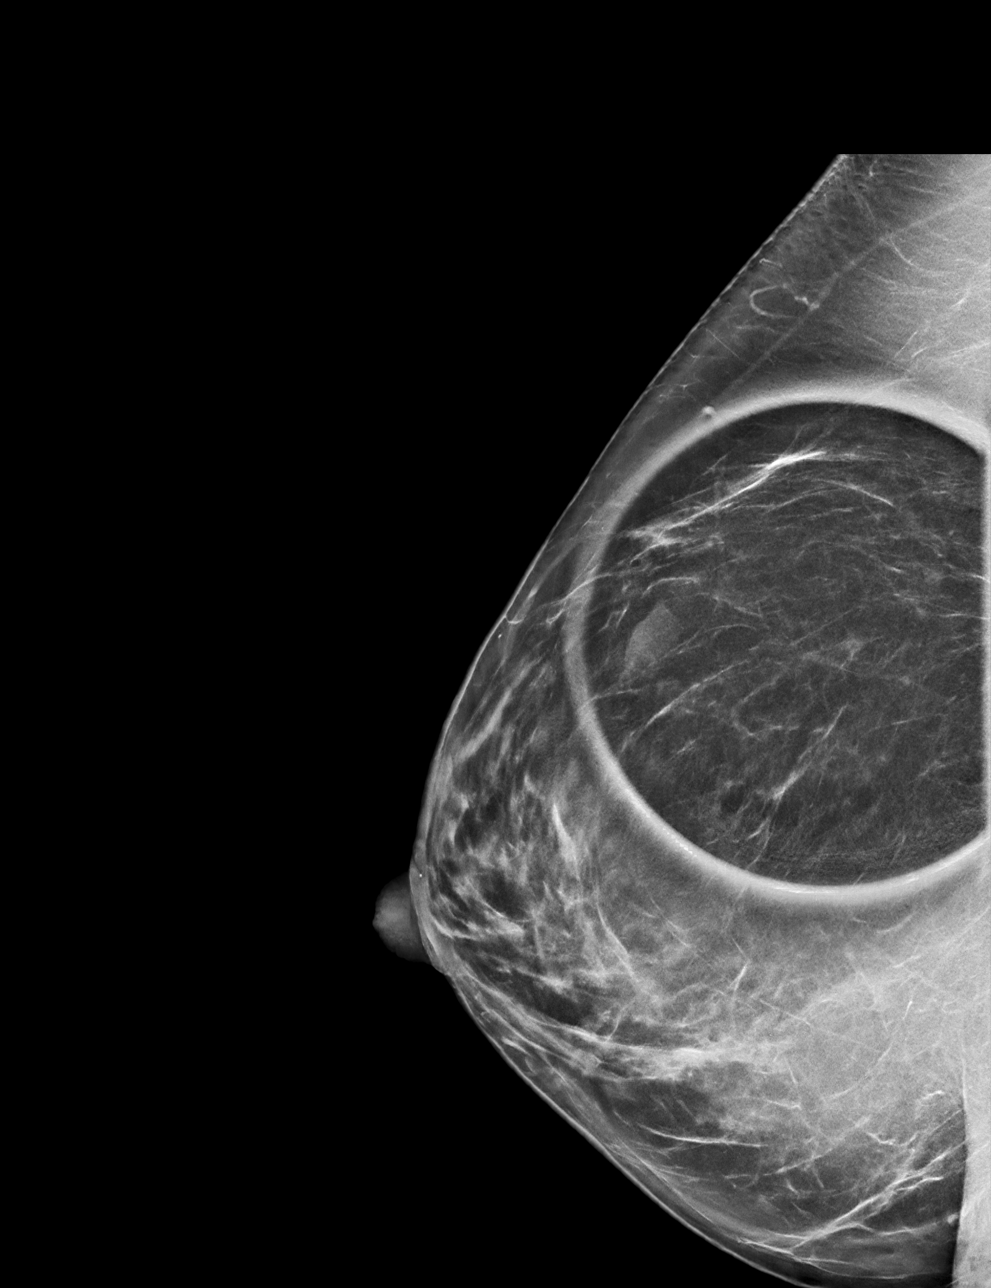

[R CC tomo · tomo slice 29/57.0]
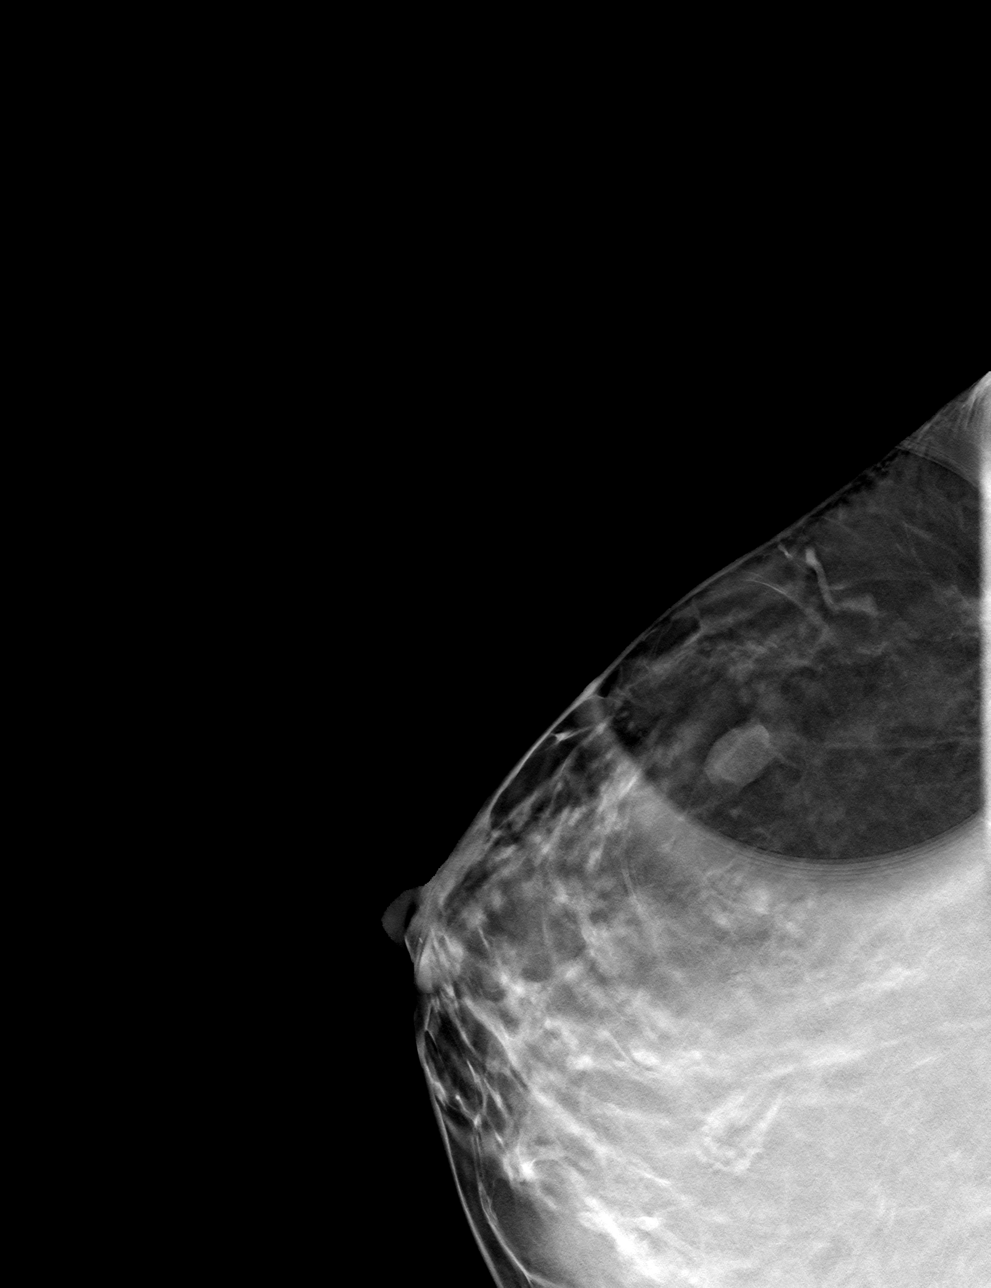

[R MLO tomo · tomo slice 36/71.0]
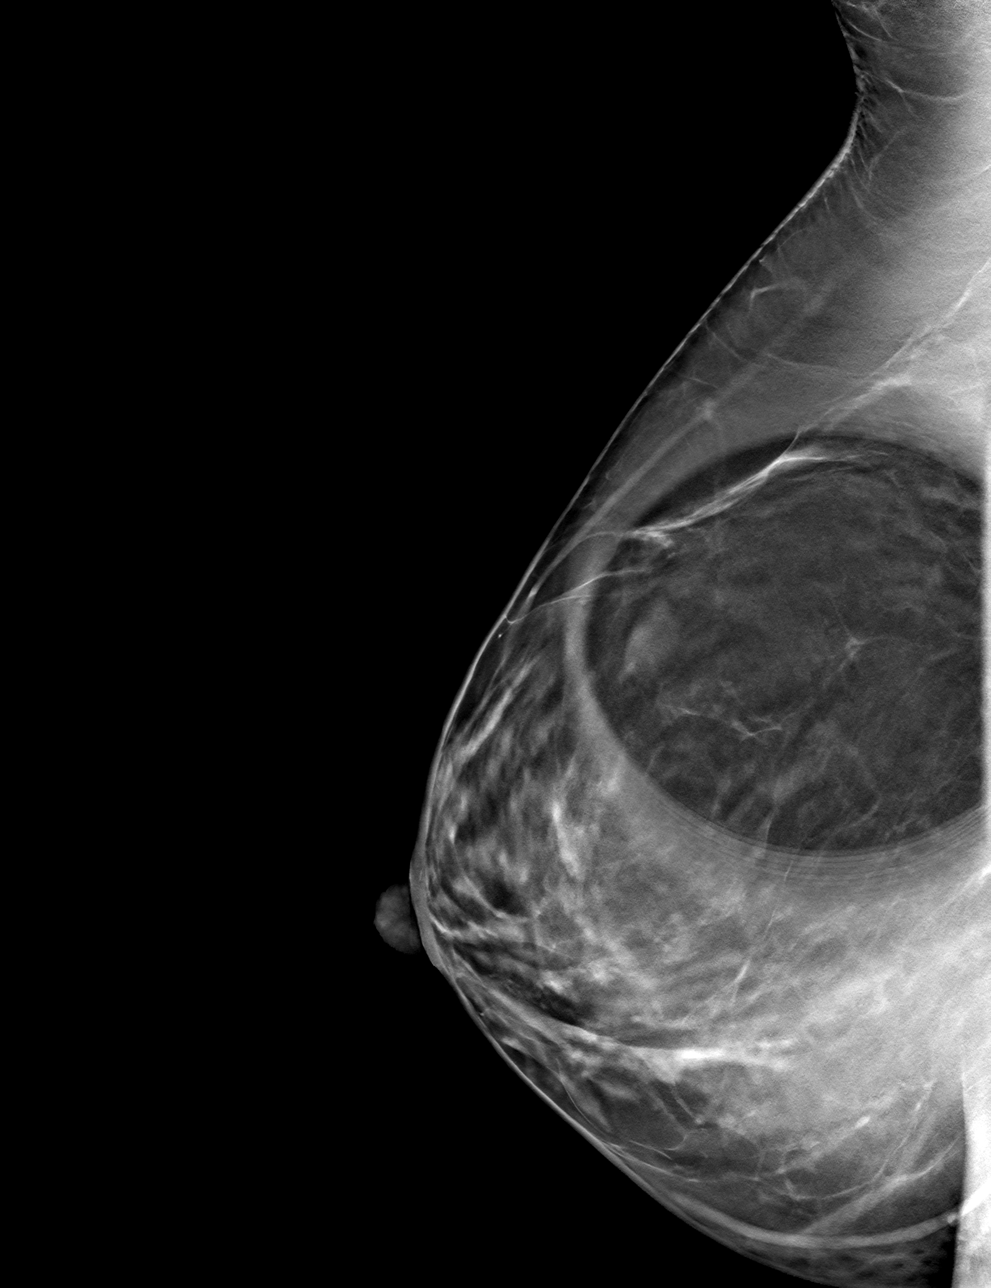

[4 of 12 positions shown; findings below may reference images not displayed]

ACR Breast Density Category b: There are scattered areas of
fibroglandular density.
FINDINGS: Additional imaging of the right breast was performed showing a 12 mm
circumscribed mass in the upper-outer quadrant of the breast. There
are no malignant type microcalcifications.

Targeted ultrasound is performed, showing a well-circumscribed
hypoechoic mass in the right breast at 10 o'clock 3 cm from the
nipple measuring 10 x 4 x 13 mm. It is likely a benign fibroadenoma.
IMPRESSION: Probable benign fibroadenoma in the right breast.

RECOMMENDATION:
Short-term interval follow-up right breast ultrasound in 6 months is
recommended.

I have discussed the findings and recommendations with the patient.
If applicable, a reminder letter will be sent to the patient
regarding the next appointment.

BI-RADS CATEGORY  3: Probably benign.

## 2021-11-23 ENCOUNTER — Ambulatory Visit: Payer: 59 | Admitting: Radiology

## 2021-11-23 ENCOUNTER — Encounter: Payer: Self-pay | Admitting: Radiology

## 2021-11-23 VITALS — BP 116/74 | Ht 66.0 in | Wt 256.0 lb

## 2021-11-23 DIAGNOSIS — R1031 Right lower quadrant pain: Secondary | ICD-10-CM | POA: Diagnosis not present

## 2021-11-23 LAB — PREGNANCY, URINE: Preg Test, Ur: NEGATIVE

## 2021-11-23 LAB — URINALYSIS, COMPLETE W/RFL CULTURE
Bacteria, UA: NONE SEEN /HPF
Bilirubin Urine: NEGATIVE
Glucose, UA: NEGATIVE
Hgb urine dipstick: NEGATIVE
Hyaline Cast: NONE SEEN /LPF
Ketones, ur: NEGATIVE
Leukocyte Esterase: NEGATIVE
Nitrites, Initial: NEGATIVE
Protein, ur: NEGATIVE
RBC / HPF: NONE SEEN /HPF (ref 0–2)
Specific Gravity, Urine: 1.015 (ref 1.001–1.035)
WBC, UA: NONE SEEN /HPF (ref 0–5)
pH: 7 (ref 5.0–8.0)

## 2021-11-23 LAB — NO CULTURE INDICATED

## 2021-11-23 NOTE — Progress Notes (Signed)
   Tiffany Ayers [birth year] [id number]   History:  46 y.o. G8P4 here with c/o RLQ pain x 1 year. Describes pain as sporadic. Relieved with ibuprofen, worse with period. S/P BTL.   Gynecologic History Patient's last menstrual period was 11/11/2021 (approximate). Period Cycle (Days): 28 Period Duration (Days): 8 Period Pattern: Regular Menstrual Flow: Moderate Dysmenorrhea: (!) Moderate Dysmenorrhea Symptoms: Cramping Contraception/Family planning: tubal ligation Sexually active: yes, no pain with intercourse Last Pap: 08/10/20. Results were: normal Last mammogram: 6/22. Results were: abnormal, Birads 3  Obstetric History OB History  Gravida Para Term Preterm AB Living  8 5 4 1 3 3   SAB IAB Ectopic Multiple Live Births  3       3    # Outcome Date GA Lbr Len/2nd Weight Sex Delivery Anes PTL Lv  8 Term 02/01/13 [redacted]w[redacted]d  7 lb 1.8 oz (3.225 kg) F CS-Vac Spinal  LIV  7 Term 08/21/11 [redacted]w[redacted]d  7 lb 11.6 oz (3.505 kg) M CS-Vac Spinal  LIV     Birth Comments: ERLTCS  6 SAB 10/2010 [redacted]w[redacted]d         5 SAB 2010 [redacted]w[redacted]d         4 SAB 2008 [redacted]w[redacted]d         3 Term 10/23/02 [redacted]w[redacted]d  6 lb 12 oz (3.062 kg) M CS-LTranv EPI  LIV     Birth Comments: Emergent  due NRFHR  2 Preterm           1 Term     M    LIV     The following portions of the patient's history were reviewed and updated as appropriate: allergies, current medications, past family history, past medical history, past social history, past surgical history, and problem list.  Review of Systems Pertinent items noted in HPI and remainder of comprehensive ROS otherwise negative.   Past medical history, past surgical history, family history and social history were all reviewed and documented in the EPIC chart.   Exam:  Vitals:   11/23/21 0808  BP: 116/74  Weight: 256 lb (116.1 kg)  Height: 5\' 6"  (1.676 m)   Body mass index is 41.32 kg/m.  General appearance:  Normal, obese Abdominal  Soft,nontender, without masses, guarding or  rebound.  Liver/spleen:  No organomegaly noted  Hernia:  None appreciated  Skin  Inspection:  Grossly normal  Genitourinary   Inguinal/mons:  Normal without inguinal adenopathy  External genitalia:  Normal appearing vulva with no masses, tenderness, or lesions  BUS/Urethra/Skene's glands:  Normal without masses or exudate  Vagina:  Normal appearing with normal color and discharge, no lesions  Cervix:  Normal appearing without discharge or lesions  Uterus:  Normal in size, shape and contour.  Mobile, nontender  Adnexa/parametria:     Rt: Normal in size, without masses, tender with deep palpation   Lt: Normal in size, without masses or tenderness.  Anus and perineum: Normal   Chaperone offered and declined  Assessment/Plan:   1. Abdominal pain, RLQ  - Pregnancy, urine - Urinalysis,Complete w/RFL Culture   Will schedule u/s to r/o ovarian origin of pain   Malaki Koury B [facility]NP-BC 8:26 AM 11/23/2021

## 2021-12-01 ENCOUNTER — Emergency Department (HOSPITAL_BASED_OUTPATIENT_CLINIC_OR_DEPARTMENT_OTHER): Payer: 59

## 2021-12-01 ENCOUNTER — Telehealth: Payer: Self-pay

## 2021-12-01 ENCOUNTER — Other Ambulatory Visit: Payer: Self-pay

## 2021-12-01 ENCOUNTER — Encounter (HOSPITAL_BASED_OUTPATIENT_CLINIC_OR_DEPARTMENT_OTHER): Payer: Self-pay | Admitting: Emergency Medicine

## 2021-12-01 ENCOUNTER — Emergency Department (HOSPITAL_BASED_OUTPATIENT_CLINIC_OR_DEPARTMENT_OTHER)
Admission: EM | Admit: 2021-12-01 | Discharge: 2021-12-01 | Disposition: A | Discharge status: Home / Self Care | Payer: 59 | Attending: Emergency Medicine | Admitting: Emergency Medicine

## 2021-12-01 DIAGNOSIS — R1031 Right lower quadrant pain: Secondary | ICD-10-CM | POA: Diagnosis present

## 2021-12-01 DIAGNOSIS — K219 Gastro-esophageal reflux disease without esophagitis: Secondary | ICD-10-CM | POA: Insufficient documentation

## 2021-12-01 LAB — URINALYSIS, ROUTINE W REFLEX MICROSCOPIC
Bilirubin Urine: NEGATIVE
Glucose, UA: NEGATIVE mg/dL
Hgb urine dipstick: NEGATIVE
Ketones, ur: NEGATIVE mg/dL
Leukocytes,Ua: NEGATIVE
Nitrite: NEGATIVE
Protein, ur: NEGATIVE mg/dL
Specific Gravity, Urine: 1.015 (ref 1.005–1.030)
pH: 6.5 (ref 5.0–8.0)

## 2021-12-01 LAB — COMPREHENSIVE METABOLIC PANEL
ALT: 17 U/L (ref 0–44)
AST: 13 U/L — ABNORMAL LOW (ref 15–41)
Albumin: 3.9 g/dL (ref 3.5–5.0)
Alkaline Phosphatase: 50 U/L (ref 38–126)
Anion gap: 11 (ref 5–15)
BUN: 14 mg/dL (ref 6–20)
CO2: 25 mmol/L (ref 22–32)
Calcium: 9.1 mg/dL (ref 8.9–10.3)
Chloride: 103 mmol/L (ref 98–111)
Creatinine, Ser: 0.81 mg/dL (ref 0.44–1.00)
GFR, Estimated: >60 mL/min (ref >60)
Glucose, Bld: 118 mg/dL — ABNORMAL HIGH (ref 70–99)
Potassium: 4.2 mmol/L (ref 3.5–5.1)
Sodium: 139 mmol/L (ref 135–145)
Total Bilirubin: 0.4 mg/dL (ref 0.3–1.2)
Total Protein: 6.6 g/dL (ref 6.5–8.1)

## 2021-12-01 LAB — CBC WITH DIFFERENTIAL/PLATELET
Abs Immature Granulocytes: 0.04 10*3/uL (ref 0.00–0.07)
Basophils Absolute: 0 10*3/uL (ref 0.0–0.1)
Basophils Relative: 1 %
Eosinophils Absolute: 0.1 10*3/uL (ref 0.0–0.5)
Eosinophils Relative: 2 %
HCT: 38.8 % (ref 36.0–46.0)
Hemoglobin: 12.9 g/dL (ref 12.0–15.0)
Immature Granulocytes: 1 %
Lymphocytes Relative: 28 %
Lymphs Abs: 2.3 10*3/uL (ref 0.7–4.0)
MCH: 28.9 pg (ref 26.0–34.0)
MCHC: 33.2 g/dL (ref 30.0–36.0)
MCV: 87 fL (ref 80.0–100.0)
Monocytes Absolute: 0.6 10*3/uL (ref 0.1–1.0)
Monocytes Relative: 7 %
Neutro Abs: 5 10*3/uL (ref 1.7–7.7)
Neutrophils Relative %: 61 %
Platelets: 232 10*3/uL (ref 150–400)
RBC: 4.46 MIL/uL (ref 3.87–5.11)
RDW: 13.4 % (ref 11.5–15.5)
WBC: 8.1 10*3/uL (ref 4.0–10.5)
nRBC: 0 % (ref 0.0–0.2)

## 2021-12-01 LAB — LIPASE, BLOOD: Lipase: 28 U/L (ref 11–51)

## 2021-12-01 LAB — PREGNANCY, URINE: Preg Test, Ur: NEGATIVE

## 2021-12-01 MED ORDER — KETOROLAC TROMETHAMINE 30 MG/ML IJ SOLN
30.0000 mg | Freq: Once | INTRAMUSCULAR | Status: AC
  Administered 2021-12-01: 30 mg via INTRAVENOUS

## 2021-12-01 MED ORDER — DICYCLOMINE HCL 20 MG PO TABS: 20.0000 mg | ORAL_TABLET | Freq: Two times a day (BID) | ORAL | 0 refills | Status: DC

## 2021-12-01 MED ORDER — DICYCLOMINE HCL 20 MG PO TABS
20.0000 mg | ORAL_TABLET | Freq: Two times a day (BID) | ORAL | 0 refills | Status: DC
  Filled 2021-12-01: qty 20, 10d supply, fill #0

## 2021-12-01 NOTE — ED Notes (Signed)
Discharge instructions, prescription, and follow up care reviewed and explained. Pt verbalized understanding and had no further questions on d/c. Pt caox4 and ambulatory on departure.

## 2021-12-01 NOTE — Telephone Encounter (Signed)
May change to [facility] if they can get her in sooner

## 2021-12-01 NOTE — ED Triage Notes (Signed)
Pt having right labia to right hip pain for months now. GYN rec [place], cant schedule for outpatient til august.

## 2021-12-01 NOTE — Telephone Encounter (Signed)
I spoke with patient and let her know I can try to schedule u/s sooner with [facility]. She said that with her insurance it will be more expensive to go there.  She has decided she will go to ER at [facility] to be assessed this afternoon.

## 2021-12-01 NOTE — ED Notes (Signed)
Patient transported to Ultrasound 

## 2021-12-01 NOTE — Telephone Encounter (Signed)
Patient called. She saw Clearnce Hasten, NP on 11/23/21 regarding chronic Right sided pain.  She said today 30 mins ago she was sitting in meeting and had "insane pain" in the same spot she told Clearnce Hasten, NP about.  She said she could hardly stand. The pain then was 8-9 on 1-10 pain scale. She took 800 mg on Ibuprofen 15 minutes ago and pain is slightly improved.  Has u/s scheduled 01/05/22 and asked about moving it up earlier.  She said she could come for OV this pm as she took off the afternoon.  No UTI sx at all. No noticeable fever.

## 2021-12-01 NOTE — ED Provider Notes (Signed)
[facility] EMERGENCY DEPT Provider Note   CSN: [id number] Arrival date & time: 12/01/21  1414     History  Chief Complaint  Patient presents with   Abdominal Pain    Tiffany Ayers is a 46 y.o. female.  HPI      Had some mild RLQ abdominal pain last year Last period lasted longer Spotting beginning of June Saw OBGYN 6/21, wanted to have [place] but can't have until August This morning suddenly at 11AM had severe pain Took ibuprofen, helped pain some, still in pain but not as much as she was, not as sharp and awful as it was before Right lower side radiating to flank from RLQ Nothing makes it better or worse, moving is there No nausea or vomiting, did feel jittery, not sure if from pain, felt off and strange for last few days, fatigue No diarrhea or constipation No vaginal bleeding or discharge  No dysuria, frequency Sexually active., no condoms married 22 years No fever  Past Medical History:  Diagnosis Date   Abnormal liver ultrasound 07/29/2019   Anti-phospholipid antibody syndrome (HCC)    clotting problems during pregnancy only with no problems with clotting  after.   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   Vaginal Pap smear, abnormal     Past Surgical History:  Procedure Laterality Date   CESAREAN SECTION  2004   CESAREAN SECTION  08/21/2011   Procedure: CESAREAN SECTION;  Surgeon: Sherron Monday, MD;  Location: [facility] ORS;  Service: Gynecology;  Laterality: N/A;   CESAREAN SECTION WITH BILATERAL TUBAL LIGATION Bilateral 02/01/2013   Procedure: REPEAT CESAREAN SECTION WITH BILATERAL TUBAL LIGATION;  Surgeon: Catalina Antigua, MD;  Location: [facility] ORS;  Service: Obstetrics;  Laterality: Bilateral;   CHOLECYSTECTOMY N/A 11/16/2020   Procedure: LAPAROSCOPIC CHOLECYSTECTOMY;  LAP BAND, Surgeon: Luretha Murphy, MD;  Location: [facility]  ORS;  Service: General;  Laterality: N/A;   KNEE ARTHROSCOPY  06/05/1998   LAPAROSCOPIC GASTRIC BANDING  2012   TONSILLECTOMY     TONSILLECTOMY       Home Medications Prior to Admission medications   Medication Sig Start Date End Date Taking? Authorizing Provider  dicyclomine (BENTYL) 20 MG tablet Take 1 tablet (20 mg total) by mouth 2 (two) times daily. 12/01/21   Alvira Monday, MD  hydrOXYzine (VISTARIL) 25 MG capsule Take 25 mg by mouth at bedtime as needed. 08/26/21   [provider]  metoprolol succinate (TOPROL-XL) 100 MG 24 hr tablet Take 100 mg by mouth daily. 10/21/21   [provider]      Allergies    Lactose intolerance (gi), Morphine and related, Tramadol, and Vicodin [hydrocodone-acetaminophen]    Review of Systems   Review of Systems  Physical Exam Updated Vital Signs BP 122/66 (BP Location: Right Arm)   Pulse (!) 56   Temp 98.1 F (36.7 C) (Oral)   Resp 16   LMP 11/11/2021 (Approximate)   SpO2 99%  Physical Exam Vitals and nursing note reviewed.  Constitutional:      General: She is not in acute distress.    Appearance: Normal appearance. She is not ill-appearing, toxic-appearing or diaphoretic.  HENT:     Head: Normocephalic.  Eyes:     Conjunctiva/sclera: Conjunctivae normal.  Cardiovascular:     Rate and Rhythm: Normal rate and regular rhythm.     Pulses:  Normal pulses.  Pulmonary:     Effort: Pulmonary effort is normal. No respiratory distress.  Abdominal:     Tenderness: There is abdominal tenderness in the right lower quadrant. There is no right CVA tenderness or left CVA tenderness.  Musculoskeletal:        General: No deformity or signs of injury.     Cervical back: No rigidity.  Skin:    General: Skin is warm and dry.     Coloration: Skin is not jaundiced or pale.  Neurological:     General: No focal deficit present.     Mental Status: She is alert and oriented to person, place, and time.     ED Results / Procedures  / Treatments   Labs (all labs ordered are listed, but only abnormal results are displayed) Labs Reviewed  COMPREHENSIVE METABOLIC PANEL - Abnormal; Notable for the following components:      Result Value   Glucose, Bld 118 (*)    AST 13 (*)    All other components within normal limits  CBC WITH DIFFERENTIAL/PLATELET  LIPASE, BLOOD  URINALYSIS, ROUTINE W REFLEX MICROSCOPIC  PREGNANCY, URINE    EKG None  Radiology CT Renal Stone Study  Result Date: 12/01/2021 CLINICAL DATA:  Right flank pain. EXAM: CT ABDOMEN AND PELVIS WITHOUT CONTRAST TECHNIQUE: Multidetector CT imaging of the abdomen and pelvis was performed following the standard protocol without IV contrast. RADIATION DOSE REDUCTION: This exam was performed according to the departmental dose-optimization program which includes automated exposure control, adjustment of the mA and/or kV according to patient size and/or use of iterative reconstruction technique. COMPARISON:  CT abdomen pelvis dated September 01, 2020. FINDINGS: Lower chest: No acute abnormality. Hepatobiliary: No focal liver abnormality is seen. Interval cholecystectomy. No biliary dilatation. Pancreas: Unremarkable. No pancreatic ductal dilatation or surrounding inflammatory changes. Spleen: Normal in size without focal abnormality. Adrenals/Urinary Tract: Adrenal glands are unremarkable. Kidneys are normal, without renal calculi, focal lesion, or hydronephrosis. Bladder is unremarkable. Stomach/Bowel: Small hiatal hernia. Interval removal of the gastric lap band. Stomach is otherwise within normal limits. No bowel wall thickening, distention, or surrounding inflammatory changes. Normal appendix. Vascular/Lymphatic: No significant vascular findings are present. No enlarged abdominal or pelvic lymph nodes. Reproductive: Uterus and bilateral adnexa are unremarkable. Other: Trace free fluid in the pelvis is likely physiologic. Unchanged tiny fat containing umbilical hernia. No  pneumoperitoneum. Musculoskeletal: No acute or significant osseous findings. IMPRESSION: 1. No acute intra-abdominal process. Electronically Signed   By: Obie Dredge M.D.   On: 12/01/2021 18:37   US PELVIC COMPLETE W TRANSVAGINAL AND TORSION R/O  Result Date: 12/01/2021 CLINICAL DATA:  Pain in RIGHT labia to RIGHT hip, LMP 11/11/2021 EXAM: TRANSABDOMINAL AND TRANSVAGINAL ULTRASOUND OF PELVIS DOPPLER ULTRASOUND OF OVARIES TECHNIQUE: Both transabdominal and transvaginal ultrasound examinations of the pelvis were performed. Transabdominal technique was performed for global imaging of the pelvis including uterus, ovaries, adnexal regions, and pelvic cul-de-sac. It was necessary to proceed with endovaginal exam following the transabdominal exam to visualize the uterus, endometrium, and ovaries. Color and duplex Doppler ultrasound was utilized to evaluate blood flow to the ovaries. COMPARISON:  None FINDINGS: Uterus Measurements: 10.7 x 5.4 x 6.4 cm = volume: 195 mL. Anteverted. Nabothian cysts at cervix. No uterine mass. Anterior wall Caesarean section scar. Endometrium Thickness: 11 mm.  No endometrial fluid or mass Right ovary Measurements: 2.8 x 3.6 x 3.1 cm = volume: 16.3 mL. Normal morphology without mass. Blood flow present within RIGHT ovary on color Doppler  imaging. Left ovary Measurements: 4.1 x 4.6 x 2.5 cm = volume: 24.7 mL. Normal morphology without mass. Internal blood flow present on color Doppler imaging. Pulsed Doppler evaluation of both ovaries demonstrates normal low-resistance arterial and venous waveforms. Other findings Small amount of nonspecific free pelvic fluid.  No adnexal masses. IMPRESSION: Small amount of nonspecific free pelvic fluid. Otherwise negative exam. Electronically Signed   By: Ulyses Southward M.D.   On: 12/01/2021 16:22    Procedures Procedures    Medications Ordered in ED Medications  ketorolac (TORADOL) 30 MG/ML injection 30 mg (30 mg Intravenous Given 12/01/21 1912)     ED Course/ Medical Decision Making/ A&P                            [id number] female presents with concern for ongoing RLQ pain for one year with sudden worsening and sharp RLQ abdominal pain radiating to the right flank today.  DDx includes appendicitis, pancreatitis, choledocolithiasis, pyelonephritis, nephrolithiasis, diverticulitis, PID, ovarian torsion, ectopic pregnancy, and tuboovarian abscess.   Labs completed and personally evaluated by me show no significant electrolyte abnormalities, no signs of pancreatiis, hepatitis, UTI, negative pregnancy test.   TVUS shows no sign of ovarian torsion or other acute abnormalities. Given recent pelvic with OBGYN doubt PID.  CT stone study shows no sign of nephrolithiasis or appendicitis or other acute abnormalities.   Discussed do not see signs of emergent etiology of pain to require emergent surgery or hospitalization however recommend continued follow up with PCP, OBGYN. Given rx for bentyl. Patient discharged in stable condition with understanding of reasons to return.         Final Clinical Impression(s) / ED Diagnoses Final diagnoses:  Right lower quadrant abdominal pain    Rx / DC Orders ED Discharge Orders          Ordered    dicyclomine (BENTYL) 20 MG tablet  2 times daily,   Status:  Discontinued        12/01/21 1852    dicyclomine (BENTYL) 20 MG tablet  2 times daily        12/01/21 1900              Alvira Monday, MD 12/02/21 1114

## 2021-12-02 ENCOUNTER — Telehealth: Payer: Self-pay | Admitting: *Deleted

## 2021-12-02 ENCOUNTER — Other Ambulatory Visit (HOSPITAL_BASED_OUTPATIENT_CLINIC_OR_DEPARTMENT_OTHER): Payer: Self-pay

## 2021-12-02 NOTE — Telephone Encounter (Signed)
Tiffany Ayers patient called to follow up from ER visit yesterday. Patient went to ER due to intense right side pain, she has CT scan done and pelvic ultrasound ( in epic) . Patient said she feels better today, just sore feeling and pressure. She asked if the pain she had yesterday could be related to ruptured ovarian cyst? And the fluid that was found on scan in her pelvis be related to this ruptured cyst? Would that fluid cause any harm to her?  Please advise

## 2021-12-02 NOTE — Telephone Encounter (Signed)
Hi Beth, A small amount of fluid in the pelvis is a normal finding. There were not any concerning findings on your ultrasound or CT. I hope you're feeling better. Gertie Exon, MD

## 2021-12-07 ENCOUNTER — Telehealth: Payer: Self-pay | Admitting: *Deleted

## 2021-12-07 MED ORDER — SULFAMETHOXAZOLE-TRIMETHOPRIM 800-160 MG PO TABS: 1.0000 | ORAL_TABLET | Freq: Two times a day (BID) | ORAL | 0 refills | Status: DC

## 2021-12-07 NOTE — Telephone Encounter (Signed)
Patient informed, Rx sent.  

## 2021-12-07 NOTE — Telephone Encounter (Signed)
Had a negative urinalysis, CT and u/s at ED on 6/29. May send bactrim ds po BID x 3 days. If no improvement will need visit.

## 2021-12-07 NOTE — Telephone Encounter (Signed)
Patient called c/o frequent urination x 2 days now, reports this is how her UTI's start. Reports she read online that after having pelvic ultrasound that you are at higher chance of getting a UTI. Patient said she had them in past and know the symptoms, lives in [place], works in [employer]. Patient also said her Friday's health insurance limits her [facility] visits. I did explain she would need an office visit for treatment. Patient asked if I would send the message to you regardless.  Please advise

## 2022-01-05 ENCOUNTER — Other Ambulatory Visit: Payer: 59

## 2022-01-05 ENCOUNTER — Other Ambulatory Visit: Payer: 59 | Admitting: Radiology

## 2022-02-21 DIAGNOSIS — Z6841 Body Mass Index (BMI) 40.0 and over, adult: Secondary | ICD-10-CM | POA: Diagnosis not present

## 2022-02-21 DIAGNOSIS — R002 Palpitations: Secondary | ICD-10-CM | POA: Diagnosis not present

## 2022-02-21 DIAGNOSIS — M7989 Other specified soft tissue disorders: Secondary | ICD-10-CM | POA: Diagnosis not present

## 2022-02-21 DIAGNOSIS — Z9884 Bariatric surgery status: Secondary | ICD-10-CM | POA: Diagnosis not present

## 2023-02-24 ENCOUNTER — Emergency Department: Admission: EM | Admit: 2023-02-24 | Payer: Self-pay | Source: Home / Self Care

## 2023-02-26 ENCOUNTER — Other Ambulatory Visit: Payer: Self-pay

## 2023-02-26 ENCOUNTER — Ambulatory Visit
Admission: EM | Admit: 2023-02-26 | Discharge: 2023-02-26 | Disposition: A | Discharge status: Home / Self Care | Payer: Managed Care, Other (non HMO) | Attending: Emergency Medicine | Admitting: Emergency Medicine

## 2023-02-26 DIAGNOSIS — S060X0A Concussion without loss of consciousness, initial encounter: Secondary | ICD-10-CM

## 2023-02-26 DIAGNOSIS — S0990XA Unspecified injury of head, initial encounter: Secondary | ICD-10-CM | POA: Diagnosis not present

## 2023-02-26 MED ORDER — DEXAMETHASONE SODIUM PHOSPHATE 10 MG/ML IJ SOLN
10.0000 mg | Freq: Once | INTRAMUSCULAR | Status: AC
  Administered 2023-02-26: 10 mg via INTRAMUSCULAR

## 2023-02-26 MED ORDER — ACETAMINOPHEN 325 MG PO TABS
975.0000 mg | ORAL_TABLET | Freq: Once | ORAL | Status: AC
  Administered 2023-02-26: 975 mg via ORAL

## 2023-02-26 MED ORDER — TIZANIDINE HCL 4 MG PO TABS: 4.0000 mg | ORAL_TABLET | Freq: Three times a day (TID) | ORAL | 0 refills | Status: AC | PRN

## 2023-02-26 MED ORDER — NAPROXEN 500 MG PO TABS: 500.0000 mg | ORAL_TABLET | Freq: Two times a day (BID) | ORAL | 0 refills | Status: AC

## 2023-02-26 MED ORDER — KETOROLAC TROMETHAMINE 30 MG/ML IJ SOLN
30.0000 mg | Freq: Once | INTRAMUSCULAR | Status: AC
  Administered 2023-02-26: 30 mg via INTRAMUSCULAR
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
## 2023-02-26 NOTE — Discharge Instructions (Signed)
Take the Zanaflex when you get home.  Rest today.  Try heat, gentle stretching.  You can take Naprosyn with Tylenol tonight.  Follow-up with your doctor as scheduled tomorrow.  You may need evaluation by concussion specialist.

## 2023-08-14 ENCOUNTER — Other Ambulatory Visit: Payer: Self-pay | Admitting: Medical Genetics

## 2023-08-28 ENCOUNTER — Other Ambulatory Visit
Admission: RE | Admit: 2023-08-28 | Discharge: 2023-08-28 | Disposition: A | Discharge status: Home / Self Care | Payer: Self-pay | Source: Ambulatory Visit | Attending: Medical Genetics | Admitting: Medical Genetics

## 2023-09-11 LAB — GENECONNECT MOLECULAR SCREEN: Genetic Analysis Overall Interpretation: NEGATIVE

## 2024-01-17 ENCOUNTER — Emergency Department

## 2024-01-17 ENCOUNTER — Emergency Department
Admission: EM | Admit: 2024-01-17 | Discharge: 2024-01-17 | Disposition: A | Discharge status: Home / Self Care | Attending: Emergency Medicine | Admitting: Emergency Medicine

## 2024-01-17 ENCOUNTER — Other Ambulatory Visit: Payer: Self-pay

## 2024-01-17 DIAGNOSIS — K449 Diaphragmatic hernia without obstruction or gangrene: Secondary | ICD-10-CM | POA: Insufficient documentation

## 2024-01-17 DIAGNOSIS — R1010 Upper abdominal pain, unspecified: Secondary | ICD-10-CM

## 2024-01-17 DIAGNOSIS — R1013 Epigastric pain: Secondary | ICD-10-CM | POA: Diagnosis present

## 2024-01-17 LAB — URINALYSIS, ROUTINE W REFLEX MICROSCOPIC
Bilirubin Urine: NEGATIVE
Glucose, UA: NEGATIVE mg/dL
Hgb urine dipstick: NEGATIVE
Ketones, ur: NEGATIVE mg/dL
Leukocytes,Ua: NEGATIVE
Nitrite: NEGATIVE
Protein, ur: NEGATIVE mg/dL
Specific Gravity, Urine: 1.003 — ABNORMAL LOW (ref 1.005–1.030)
pH: 7 (ref 5.0–8.0)

## 2024-01-17 LAB — COMPREHENSIVE METABOLIC PANEL WITH GFR
ALT: 19 U/L (ref 0–44)
AST: 17 U/L (ref 15–41)
Albumin: 3.6 g/dL (ref 3.5–5.0)
Alkaline Phosphatase: 49 U/L (ref 38–126)
Anion gap: 7 (ref 5–15)
BUN: 13 mg/dL (ref 6–20)
CO2: 25 mmol/L (ref 22–32)
Calcium: 8.8 mg/dL — ABNORMAL LOW (ref 8.9–10.3)
Chloride: 106 mmol/L (ref 98–111)
Creatinine, Ser: 0.64 mg/dL (ref 0.44–1.00)
GFR, Estimated: >60 mL/min (ref >60)
Glucose, Bld: 101 mg/dL — ABNORMAL HIGH (ref 70–99)
Potassium: 4.1 mmol/L (ref 3.5–5.1)
Sodium: 138 mmol/L (ref 135–145)
Total Bilirubin: 0.9 mg/dL (ref 0.0–1.2)
Total Protein: 6.3 g/dL — ABNORMAL LOW (ref 6.5–8.1)

## 2024-01-17 LAB — CBC
HCT: 39.6 % (ref 36.0–46.0)
Hemoglobin: 13.2 g/dL (ref 12.0–15.0)
MCH: 29.4 pg (ref 26.0–34.0)
MCHC: 33.3 g/dL (ref 30.0–36.0)
MCV: 88.2 fL (ref 80.0–100.0)
Platelets: 214 K/uL (ref 150–400)
RBC: 4.49 MIL/uL (ref 3.87–5.11)
RDW: 13.3 % (ref 11.5–15.5)
WBC: 7.6 K/uL (ref 4.0–10.5)
nRBC: 0 % (ref 0.0–0.2)

## 2024-01-17 LAB — LIPASE, BLOOD: Lipase: 36 U/L (ref 11–51)

## 2024-01-17 LAB — PREGNANCY, URINE: Preg Test, Ur: NEGATIVE

## 2024-01-17 MED ORDER — IOHEXOL 300 MG/ML  SOLN
100.0000 mL | Freq: Once | INTRAMUSCULAR | Status: AC | PRN
  Administered 2024-01-17: 100 mL via INTRAVENOUS

## 2024-01-17 NOTE — ED Triage Notes (Signed)
 Pt reports right side abd pain for past few days, pt repots pain as spasms and states she has been nauseous. Denies dysuria.

## 2024-01-17 NOTE — Discharge Instructions (Addendum)
 You are found to have a hiatal hernia on your CT today, but otherwise you had normal CT and bloodwork and urine tests.  Please follow-up with surgery and gastroenterology.  Please return for any new, worsening, or change in symptoms or other concerns.  Was a pleasure caring for you today.

## 2024-01-17 NOTE — ED Provider Notes (Signed)
 [facility] Provider Note    Event Date/Time   First MD Initiated Contact with Patient 01/17/24 [phone]     (approximate)   History   Abdominal Pain   HPI  Tiffany Ayers is a 48 y.o. female with a past medical history of gastric banding, cholecystectomy who presents today for evaluation of right sided abdominal pain.  Patient reports that her symptoms have been intermittent for the past 2 weeks and worse over the past 2 days.  She has not noticed any changes in her pain with eating food.  She has not had any nausea or vomiting.  She describes the pain as a spasm.  No urinary symptoms.  No vaginal discharge or bleeding.  No fevers or chills.  She has been having normal bowel movements.  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   Bilateral hip pain 02/18/2020   Bursitis of left hip 02/18/2020   Insomnia 02/18/2020   Abnormal liver ultrasound 07/29/2019   Chronic RUQ pain 07/25/2019   Chronic right shoulder pain 07/25/2019   Rotator cuff tendonitis, right 06/18/2019   Greater trochanteric bursitis of left hip 06/18/2019   Chronic right-sided low back pain with right-sided sciatica 07/05/2018   Forgetfulness 12/05/2016   Headache 04/18/2016   History of laparoscopic adjustable gastric banding, APS, 02/01/2010+ HH repair 07/10/2012          Physical Exam   Triage Vital Signs: ED Triage Vitals [01/17/24 0658]  Encounter Vitals Group     BP 123/78     Girls Systolic BP Percentile      Girls Diastolic BP Percentile      Boys Systolic BP Percentile      Boys Diastolic BP Percentile      Pulse Rate 87     Resp 17     Temp 98.7 F (37.1 C)     Temp src      SpO2 100 %     Weight 260 lb (117.9 kg)     Height 5' 5 (1.651 m)     Head Circumference      Peak Flow       Pain Score 8     Pain Loc      Pain Education      Exclude from Growth Chart     Most recent vital signs: Vitals:   01/17/24 0746 01/17/24 1123  BP:  120/80  Pulse:  80  Resp:  18  Temp:  98.3 F (36.8 C)  SpO2: 100% 100%    Physical Exam Vitals and nursing note reviewed.  Constitutional:      General: Awake and alert. No acute distress.    Appearance: Normal appearance.  HENT:     Head: Normocephalic and atraumatic.     Mouth: Mucous membranes are moist.  Eyes:     General: PERRL. Normal EOMs        Right eye: No discharge.        Left eye: No discharge.     Conjunctiva/sclera: Conjunctivae normal.  Cardiovascular:     Rate and Rhythm: Normal rate and regular rhythm.     Pulses: Normal pulses.  Pulmonary:     Effort: Pulmonary effort is normal. No respiratory distress.     Breath sounds: Normal breath sounds.  Abdominal:  Abdomen is soft. There is right mid and epigastric tenderness. No rebound or guarding. No distention.  No right upper quadrant tenderness. Musculoskeletal:        General: No swelling. Normal range of motion.     Cervical back: Normal range of motion and neck supple.  Skin:    General: Skin is warm and dry.     Capillary Refill: Capillary refill takes less than 2 seconds.     Findings: No rash.  Neurological:     Mental Status: The patient is awake and alert.      ED Results / Procedures / Treatments   Labs (all labs ordered are listed, but only abnormal results are displayed) Labs Reviewed  COMPREHENSIVE METABOLIC PANEL WITH GFR - Abnormal; Notable for the following components:      Result Value   Glucose, Bld 101 (*)    Calcium 8.8 (*)    Total Protein 6.3 (*)    All other components within normal limits  URINALYSIS, ROUTINE W REFLEX MICROSCOPIC - Abnormal; Notable for the following components:   Color, Urine STRAW (*)    APPearance CLEAR (*)    Specific Gravity, Urine 1.003 (*)    All other components within normal limits   LIPASE, BLOOD  CBC  PREGNANCY, URINE  POC URINE PREG, ED     EKG     RADIOLOGY I independently reviewed and interpreted imaging and agree with radiologists findings.     PROCEDURES:  Critical Care performed:   Procedures   MEDICATIONS ORDERED IN ED: Medications  iohexol  (OMNIPAQUE ) 300 MG/ML solution 100 mL (100 mLs Intravenous Contrast Given 01/17/24 1005)     IMPRESSION / MDM / ASSESSMENT AND PLAN / ED COURSE  I reviewed the triage vital signs and the nursing notes.   Differential diagnosis includes, but is not limited to, pancreatitis, retained stone, sphincter of Oddi dysfunction, postcholecystectomy syndrome, gastritis, esophagitis, peptic ulcer disease.  Patient is awake and alert, hemodynamically stable and afebrile.  She is nontoxic in appearance.  She was offered analgesia but declined.  Labs obtained in triage are overall reassuring.  Normal LFTs and lipase, no leukocytosis.  Urinalysis is not suggestive of urinary tract infection.  CT scan obtained for further evaluation shows a hiatal hernia without other notable abnormalities.  I discussed this with finding with the patient and she was very frustrated that there is not a more clear reason for her pain.  I offered further workup including further advanced imaging, though patient declined.  She is frustrated that she has spent several hours here without clear reason for her pain, and I again offered further workup and she declined.  She also declined analgesia and antiemetics.  I recommended outpatient follow-up with surgery and gastroenterology.  We also discussed return precautions.  Patient understands and agrees with plan.  She was discharged in stable condition.  Case was discussed with Dr. Jacolyn who agrees with assessment and plan.  Patient's presentation is most consistent with acute complicated illness / injury requiring diagnostic workup.   Clinical Course as of 01/17/24 1321  Thu Jan 17, 2024   1112 Discussed findings of hiatal hernia as well as rest of reassuring CAT scan results and work and urine results.  Patient still declines analgesia.  She is frustrated that she does not know why she has pain for so long.  I recommended further workup including ultrasound for further evaluation but patient declined.  Recommended outpatient follow-up with general surgery/gastroenterology.  Patient is frustrated that  she has waited 4 hours for nothing.  Still refusing further workup [JP]    Clinical Course User Index [JP] Chantille Navarrete E, PA-C     FINAL CLINICAL IMPRESSION(S) / ED DIAGNOSES   Final diagnoses:  Pain of upper abdomen  Hiatal hernia     Rx / DC Orders   ED Discharge Orders     None        Note:  This document was prepared using Dragon voice recognition software and may include unintentional dictation errors.   Teriyah Purington E, PA-C 01/17/24 1321    Jacolyn Pae, MD 01/17/24 1952

## 2024-04-21 ENCOUNTER — Ambulatory Visit: Admitting: Family Medicine
